# Patient Record
Sex: Male | Born: 1970 | Race: Black or African American | Hispanic: No | Marital: Single | Smoking: Current some day smoker
Health system: Southern US, Community
[De-identification: ages and names within clinical notes are randomized; demographics above are authoritative.]

## PROBLEM LIST (undated history)

## (undated) DIAGNOSIS — I10 Essential (primary) hypertension: Secondary | ICD-10-CM

## (undated) DIAGNOSIS — Q2730 Arteriovenous malformation, site unspecified: Secondary | ICD-10-CM

## (undated) DIAGNOSIS — N529 Male erectile dysfunction, unspecified: Secondary | ICD-10-CM

## (undated) DIAGNOSIS — F22 Delusional disorders: Secondary | ICD-10-CM

## (undated) DIAGNOSIS — Q282 Arteriovenous malformation of cerebral vessels: Secondary | ICD-10-CM

## (undated) DIAGNOSIS — T1491XA Suicide attempt, initial encounter: Secondary | ICD-10-CM

## (undated) DIAGNOSIS — F411 Generalized anxiety disorder: Secondary | ICD-10-CM

## (undated) DIAGNOSIS — Z9889 Other specified postprocedural states: Secondary | ICD-10-CM

## (undated) DIAGNOSIS — H9190 Unspecified hearing loss, unspecified ear: Secondary | ICD-10-CM

## (undated) HISTORY — PX: FRACTURE SURGERY: SHX138

---

## 2000-04-20 ENCOUNTER — Encounter: Payer: Self-pay | Admitting: Emergency Medicine

## 2000-04-20 ENCOUNTER — Emergency Department (HOSPITAL_COMMUNITY): Admission: EM | Admit: 2000-04-20 | Discharge: 2000-04-20 | Payer: Self-pay | Admitting: Emergency Medicine

## 2000-04-20 DIAGNOSIS — Q282 Arteriovenous malformation of cerebral vessels: Secondary | ICD-10-CM

## 2000-04-20 HISTORY — DX: Arteriovenous malformation of cerebral vessels: Q28.2

## 2000-04-28 ENCOUNTER — Encounter: Admission: RE | Admit: 2000-04-28 | Discharge: 2000-04-28 | Payer: Self-pay | Admitting: Internal Medicine

## 2000-05-05 ENCOUNTER — Encounter: Admission: RE | Admit: 2000-05-05 | Discharge: 2000-05-05 | Payer: Self-pay | Admitting: Hematology and Oncology

## 2000-06-06 ENCOUNTER — Ambulatory Visit (HOSPITAL_COMMUNITY): Admission: RE | Admit: 2000-06-06 | Discharge: 2000-06-06 | Payer: Self-pay

## 2000-06-06 ENCOUNTER — Emergency Department (HOSPITAL_COMMUNITY): Admission: EM | Admit: 2000-06-06 | Discharge: 2000-06-06 | Payer: Self-pay | Admitting: Emergency Medicine

## 2000-06-06 ENCOUNTER — Encounter: Payer: Self-pay | Admitting: Emergency Medicine

## 2006-11-01 ENCOUNTER — Emergency Department (HOSPITAL_COMMUNITY): Admission: EM | Admit: 2006-11-01 | Discharge: 2006-11-01 | Payer: Self-pay | Admitting: Family Medicine

## 2006-12-12 ENCOUNTER — Emergency Department (HOSPITAL_COMMUNITY): Admission: EM | Admit: 2006-12-12 | Discharge: 2006-12-12 | Payer: Self-pay | Admitting: Family Medicine

## 2010-01-25 HISTORY — PX: CARDIAC CATHETERIZATION: SHX172

## 2010-02-20 ENCOUNTER — Emergency Department (HOSPITAL_COMMUNITY): Admission: EM | Admit: 2010-02-20 | Discharge: 2010-02-20 | Payer: Self-pay | Admitting: Family Medicine

## 2010-02-20 ENCOUNTER — Inpatient Hospital Stay (HOSPITAL_COMMUNITY): Admission: EM | Admit: 2010-02-20 | Discharge: 2010-02-24 | Payer: Self-pay | Admitting: Emergency Medicine

## 2010-02-24 DIAGNOSIS — Z9889 Other specified postprocedural states: Secondary | ICD-10-CM

## 2010-02-24 HISTORY — DX: Other specified postprocedural states: Z98.890

## 2010-11-18 IMAGING — CT CT HEAD W/O CM
1 series · 16 of 30 positions shown, 20 images · non-contrast
Comparison: None.

CLINICAL DATA: Hypertensive.

CT HEAD WITHOUT CONTRAST
TECHNIQUE: Contiguous axial images were obtained from the base of
the skull through the vertex without contrast.

[Series 2: head routine 4.8 h37s · axial · 0.48mm/px · z∈[-139,+15]mm · 16 of 36 slices shown, 20 images]
[im 2/36  brain]
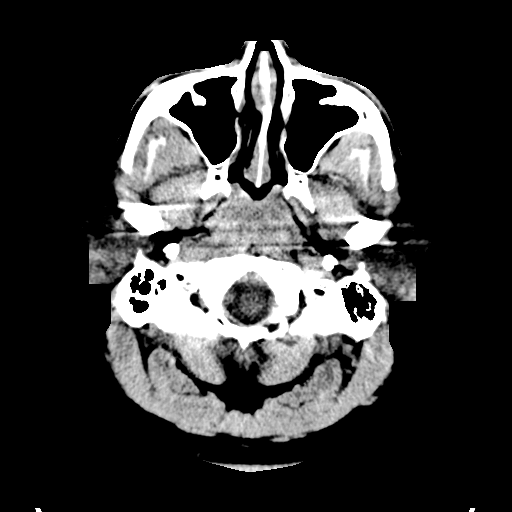
[im 2/36  bone]
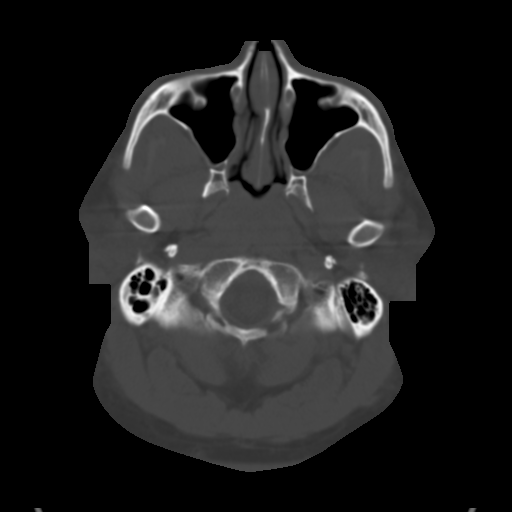
[im 4/36  brain]
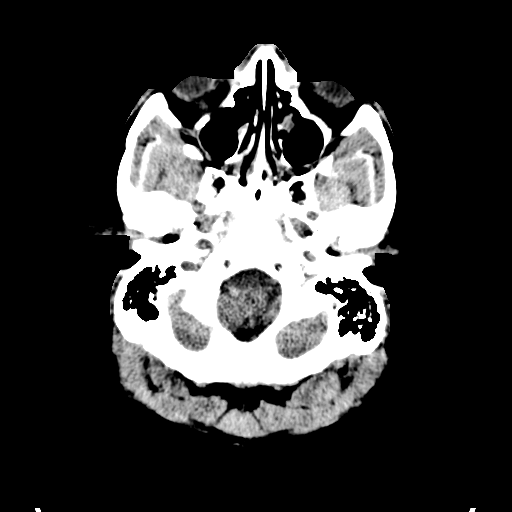
[im 7/36  brain]
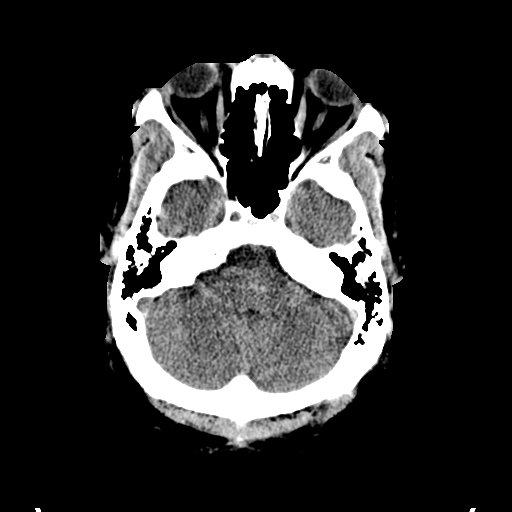
[im 9/36  brain]
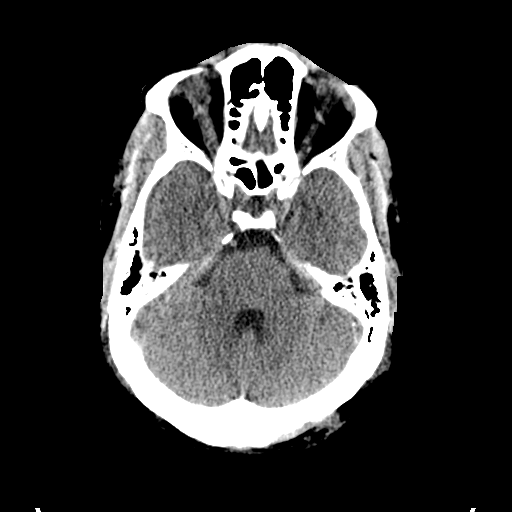
[im 10/36  brain]
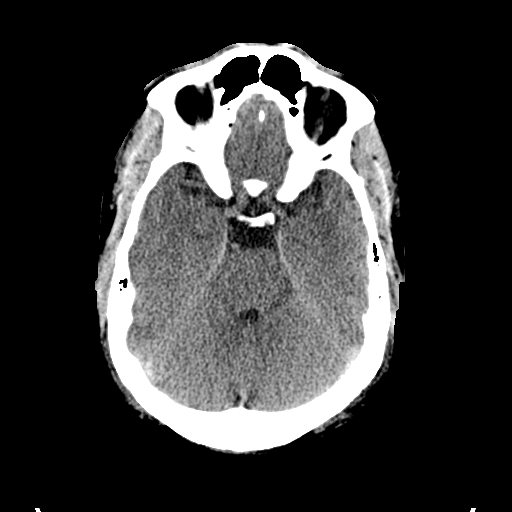
[im 10/36  bone]
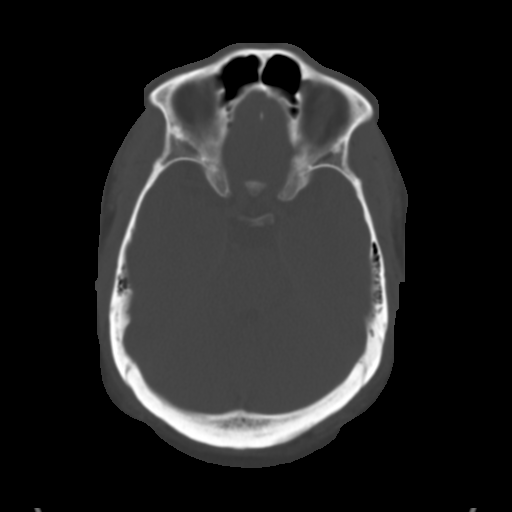
[im 13/36  brain]
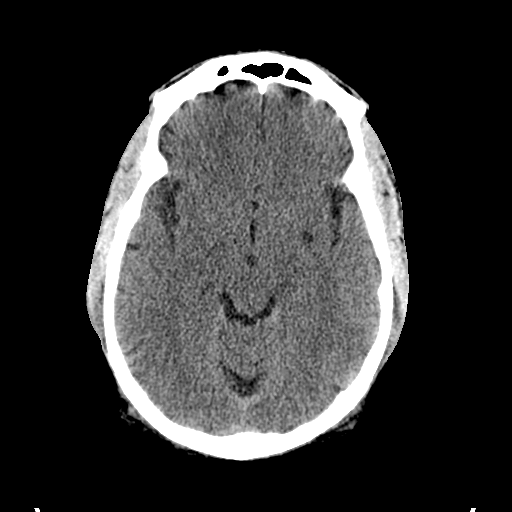
[im 15/36  brain]
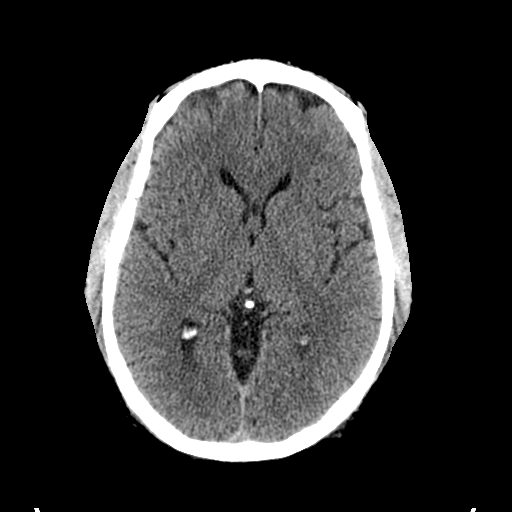
[im 17/36  brain]
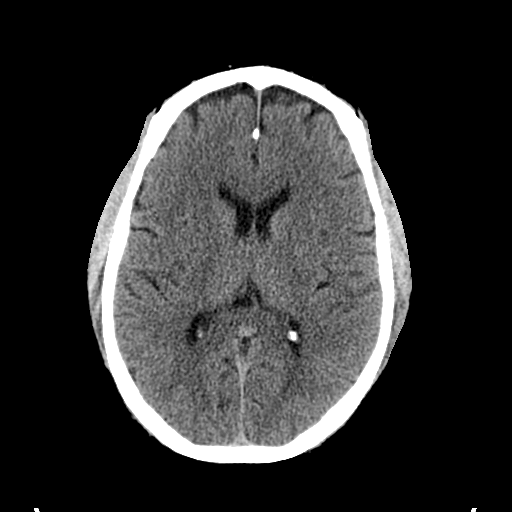
[im 19/36  brain]
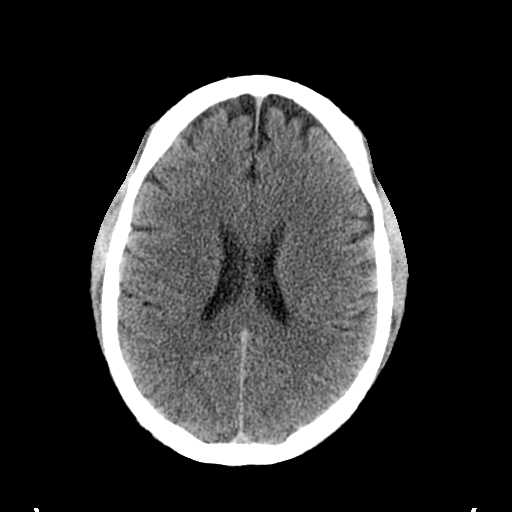
[im 19/36  bone]
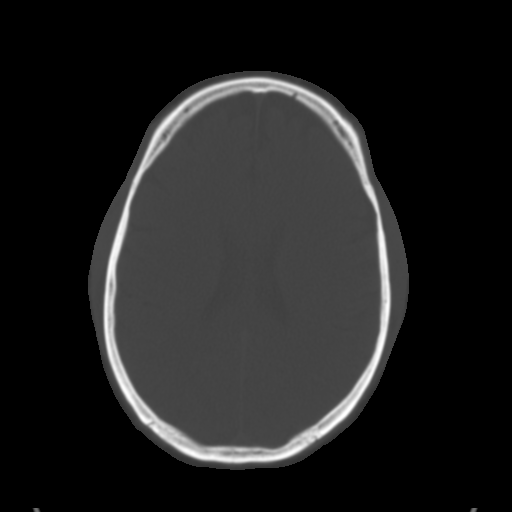
[im 21/36  brain]
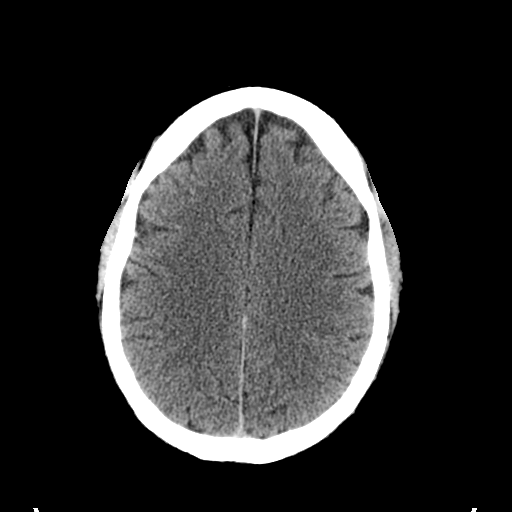
[im 23/36  brain]
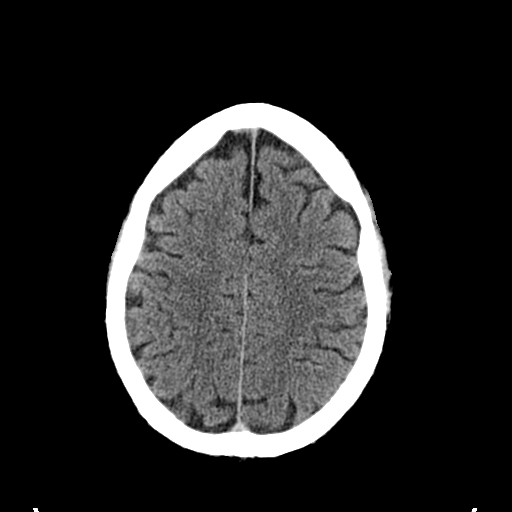
[im 26/36  brain]
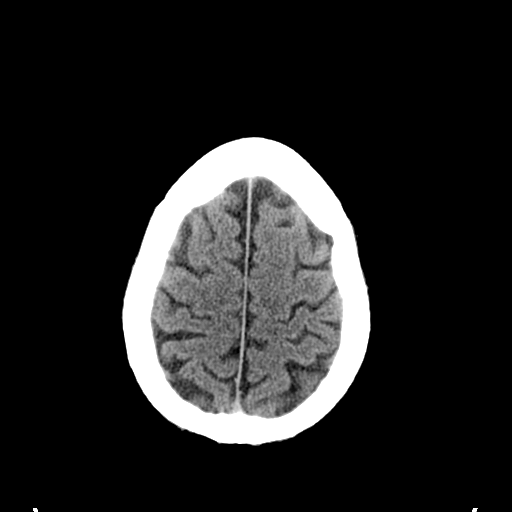
[im 27/36  brain]
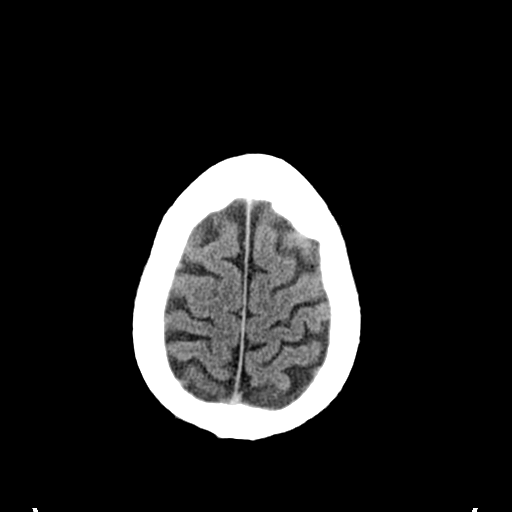
[im 27/36  bone]
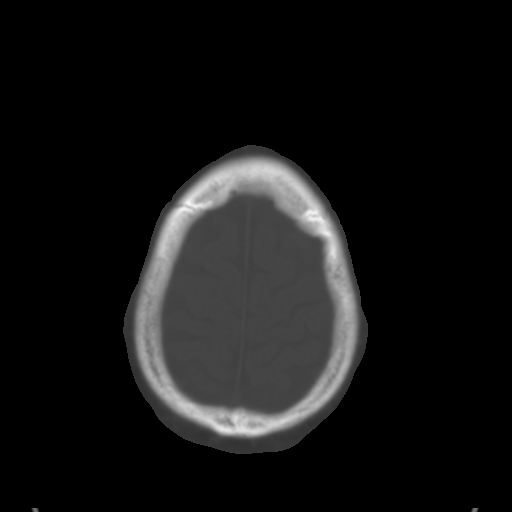
[im 29/36  brain]
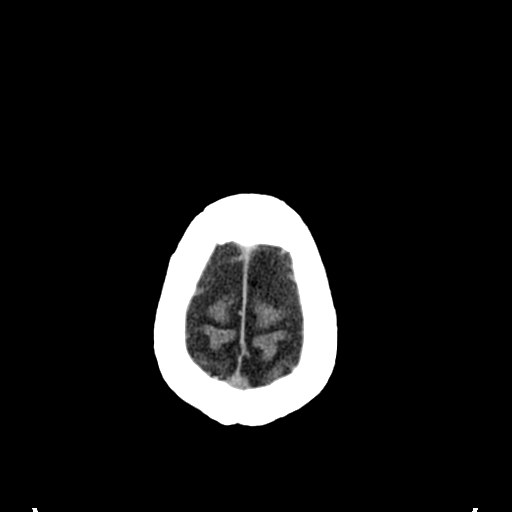
[im 32/36  brain]
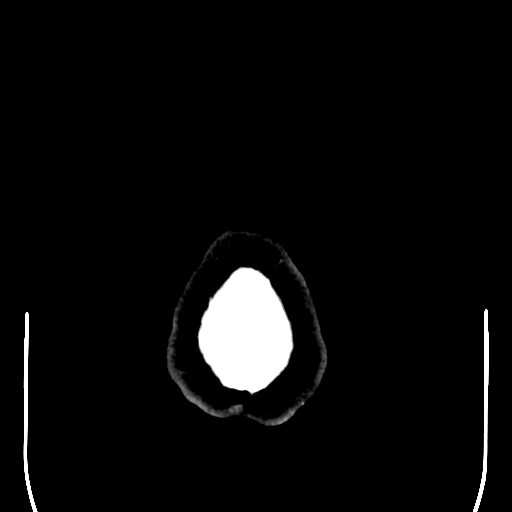
[im 34/36  brain]
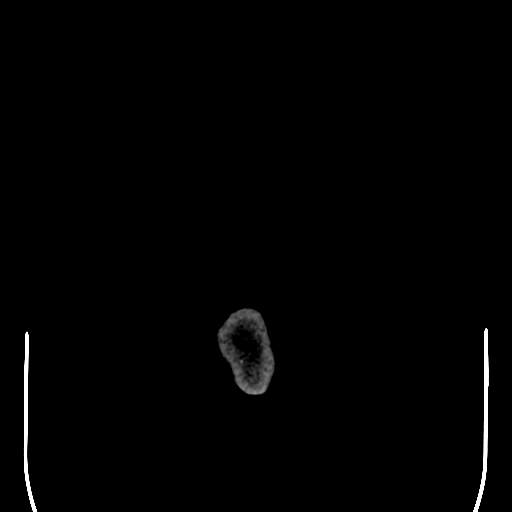

[16 of 30 positions shown; findings below may reference images not displayed]

FINDINGS: No acute intracranial abnormality.  No hemorrhage, acute
infarction, mass lesion, or hydrocephalus.  Aerated paranasal and
mastoid sinuses.
IMPRESSION: No acute intracranial findings.

## 2010-12-14 LAB — CBC
HCT: 42.2 % (ref 39.0–52.0)
Hemoglobin: 13 g/dL (ref 13.0–17.0)
Hemoglobin: 14.1 g/dL (ref 13.0–17.0)
MCHC: 33.5 g/dL (ref 30.0–36.0)
MCHC: 33.9 g/dL (ref 30.0–36.0)
MCHC: 34.3 g/dL (ref 30.0–36.0)
MCV: 83 fL (ref 78.0–100.0)
WBC: 4.5 10*3/uL (ref 4.0–10.5)

## 2010-12-14 LAB — COMPREHENSIVE METABOLIC PANEL
ALT: 28 U/L (ref 0–53)
AST: 26 U/L (ref 0–37)
BUN: 11 mg/dL (ref 6–23)
Creatinine, Ser: 1.14 mg/dL (ref 0.4–1.5)
GFR calc non Af Amer: >60 mL/min (ref >60)
Glucose, Bld: 109 mg/dL — ABNORMAL HIGH (ref 70–99)

## 2010-12-14 LAB — CK: Total CK: 515 U/L — ABNORMAL HIGH (ref 7–232)

## 2010-12-14 LAB — BASIC METABOLIC PANEL
BUN: 11 mg/dL (ref 6–23)
CO2: 29 mEq/L (ref 19–32)
CO2: 29 mEq/L (ref 19–32)
Calcium: 9 mg/dL (ref 8.4–10.5)
Chloride: 106 mEq/L (ref 96–112)
Creatinine, Ser: 1.2 mg/dL (ref 0.4–1.5)
GFR calc Af Amer: >60 mL/min (ref >60)
GFR calc non Af Amer: >60 mL/min (ref >60)
Glucose, Bld: 118 mg/dL — ABNORMAL HIGH (ref 70–99)
Potassium: 3.8 mEq/L (ref 3.5–5.1)
Sodium: 140 mEq/L (ref 135–145)

## 2010-12-14 LAB — DIFFERENTIAL
Basophils Absolute: 0 10*3/uL (ref 0.0–0.1)
Basophils Relative: 0 % (ref 0–1)
Eosinophils Absolute: 0.2 10*3/uL (ref 0.0–0.7)
Eosinophils Relative: 3 % (ref 0–5)
Lymphocytes Relative: 38 % (ref 12–46)

## 2010-12-14 LAB — POCT CARDIAC MARKERS: Myoglobin, poc: 98.9 ng/mL (ref 12–200)

## 2010-12-14 LAB — CARDIAC PANEL(CRET KIN+CKTOT+MB+TROPI)
CK, MB: 2.3 ng/mL (ref 0.3–4.0)
Relative Index: 0.5 (ref 0.0–2.5)
Total CK: 431 U/L — ABNORMAL HIGH (ref 7–232)
Troponin I: 0.01 ng/mL (ref 0.00–0.06)
Troponin I: <0.01 ng/mL (ref 0.00–0.06)

## 2010-12-14 LAB — BRAIN NATRIURETIC PEPTIDE: Pro B Natriuretic peptide (BNP): 30 pg/mL (ref 0.0–100.0)

## 2010-12-14 LAB — PROTIME-INR: Prothrombin Time: 13 seconds (ref 11.6–15.2)

## 2010-12-14 LAB — LIPID PANEL
Total CHOL/HDL Ratio: 5.5 RATIO
Triglycerides: 188 mg/dL — ABNORMAL HIGH (ref <150)

## 2010-12-14 LAB — URINE CULTURE: Culture: NO GROWTH

## 2010-12-14 LAB — CK TOTAL AND CKMB (NOT AT ARMC): Total CK: 589 U/L — ABNORMAL HIGH (ref 7–232)

## 2010-12-14 LAB — URINALYSIS, ROUTINE W REFLEX MICROSCOPIC
Bilirubin Urine: NEGATIVE
Nitrite: NEGATIVE
Specific Gravity, Urine: 1.011 (ref 1.005–1.030)
Urobilinogen, UA: 0.2 mg/dL (ref 0.0–1.0)

## 2010-12-14 LAB — TSH: TSH: 1.053 u[IU]/mL (ref 0.350–4.500)

## 2011-02-12 NOTE — Consult Note (Signed)
[place]  Patient:    Timothy Jackson, Timothy Jackson                       MRN: [id number] Proc. Date: 04/20/00 Adm. Date:  [id number] Attending:  Molpus, John L CC:         Dr. ____________                          Consultation Report  REASON FOR REQUEST:  Midbrain arteriovenous malformation.  HISTORY OF PRESENT ILLNESS:  The patient is a 40 year old, right-handed, black male who complains of hearing loss over a 8-month period involving the right ear.  He has had ear pain for two days and came to the ER to be evaluated.  he was found to be hypertensive, with a blood pressure of 178/94.  An MRI was performed because of the history of hearing loss and it was felt to be neurosensory on evaluation by the emergency room physician.  MRI showed a small AVM in the dorsal aspect of the fourth ventricle near the inferior caliculus.  There is no mass effect.  There is no evidence of bleeding. Neurosurgical consultation was requested.  PAST MEDICAL HISTORY:  The patients general health has been very good.  He has noted some skin lesions which have ruptured and flown pus, which seemed to be either infected sebaceous cysts or carbuncles.  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  He notes no other health problems.  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  ALLERGIES:  He notes no allergies to any medications.  REVIEW OF SYSTEMS:  Negative except for the items in the history of present illness.  PHYSICAL EXAMINATION:  GENERAL:  He is an alert, oriented, cooperative individual in no overt distress.  NEUROLOGIC:  He has pupils that are 4 mm, briskly reactive to light and accommodation.  The extraocular movements are full.  The face is symmetric to grimace.  Tongue and uvula are in the midline.  Sclerae and conjunctivae  are clear.  Fundi reveal the disks to be flat.  No spontaneous venous pulsations are seen.  Upper and lower extremity strength is normal.  Reflexes are normal. There is a paucity of hearing in the right ear.  There appears to be normal hearing in the left ear.  DIAGNOSTIC STUDIES:  Review of the MRI demonstrates the midbrain lesion at the dorsal aspect of the fourth ventricle near what would be the inferior caliculus.  It is most likely consistent with a small arteriovenous malformation.  IMPRESSION:  My impression at the current time is that the patient has hearing loss in the right ear.  He has an arteriovenous malformation of the brain stem.  At this point I am not certain that there is a relationship between these two lesions.  He also has a diagnosis of hypertension noted today.  He has not had any treatment for this and does not have a family physician that he sees regularly.  I have advised that we should perform formal audiometry on an outpatient basis.  The patient will require an angiogram to better define the nature of the arteriovenous malformation.  Also, he may need further followup for his hypertension although this is a new finding at the current time  and needs to be followed for some period of time.  I will see the patient as an outpatient for the follow-up studies. DD:  04/20/00 TD:  04/21/00 Job: [id number]

## 2011-10-07 ENCOUNTER — Emergency Department (HOSPITAL_COMMUNITY)
Admission: EM | Admit: 2011-10-07 | Discharge: 2011-10-07 | Disposition: A | Discharge status: Home / Self Care | Payer: Self-pay

## 2011-10-07 NOTE — ED Notes (Signed)
Nursing has attempted to triage pt 3 times with no response to calling. Pt's belongings are in the triage room but unable to locate pt. Someone stated that he went outside to use his phone.

## 2011-10-09 ENCOUNTER — Other Ambulatory Visit: Payer: Self-pay

## 2011-10-09 ENCOUNTER — Observation Stay (HOSPITAL_COMMUNITY)
Admission: EM | Admit: 2011-10-09 | Discharge: 2011-10-09 | Disposition: A | Discharge status: Home / Self Care | Payer: Self-pay | Attending: Emergency Medicine | Admitting: Emergency Medicine

## 2011-10-09 ENCOUNTER — Observation Stay (HOSPITAL_COMMUNITY): Payer: Self-pay

## 2011-10-09 ENCOUNTER — Encounter (HOSPITAL_COMMUNITY): Payer: Self-pay | Admitting: Emergency Medicine

## 2011-10-09 DIAGNOSIS — R51 Headache: Secondary | ICD-10-CM | POA: Insufficient documentation

## 2011-10-09 DIAGNOSIS — R2240 Localized swelling, mass and lump, unspecified lower limb: Secondary | ICD-10-CM

## 2011-10-09 DIAGNOSIS — Z9119 Patient's noncompliance with other medical treatment and regimen: Secondary | ICD-10-CM | POA: Insufficient documentation

## 2011-10-09 DIAGNOSIS — I5030 Unspecified diastolic (congestive) heart failure: Secondary | ICD-10-CM | POA: Insufficient documentation

## 2011-10-09 DIAGNOSIS — R42 Dizziness and giddiness: Secondary | ICD-10-CM | POA: Insufficient documentation

## 2011-10-09 DIAGNOSIS — Z91199 Patient's noncompliance with other medical treatment and regimen due to unspecified reason: Secondary | ICD-10-CM | POA: Insufficient documentation

## 2011-10-09 DIAGNOSIS — F17200 Nicotine dependence, unspecified, uncomplicated: Secondary | ICD-10-CM | POA: Insufficient documentation

## 2011-10-09 DIAGNOSIS — M79609 Pain in unspecified limb: Secondary | ICD-10-CM

## 2011-10-09 DIAGNOSIS — I10 Essential (primary) hypertension: Principal | ICD-10-CM | POA: Insufficient documentation

## 2011-10-09 HISTORY — DX: Essential (primary) hypertension: I10

## 2011-10-09 HISTORY — DX: Other specified postprocedural states: Z98.890

## 2011-10-09 HISTORY — DX: Male erectile dysfunction, unspecified: N52.9

## 2011-10-09 HISTORY — DX: Unspecified hearing loss, unspecified ear: H91.90

## 2011-10-09 LAB — URINALYSIS, ROUTINE W REFLEX MICROSCOPIC
Glucose, UA: NEGATIVE mg/dL
Hgb urine dipstick: NEGATIVE
Ketones, ur: NEGATIVE mg/dL
Protein, ur: NEGATIVE mg/dL

## 2011-10-09 LAB — BASIC METABOLIC PANEL
CO2: 26 mEq/L (ref 19–32)
Calcium: 9.3 mg/dL (ref 8.4–10.5)
Creatinine, Ser: 0.95 mg/dL (ref 0.50–1.35)
Glucose, Bld: 105 mg/dL — ABNORMAL HIGH (ref 70–99)

## 2011-10-09 LAB — CBC
Hemoglobin: 12.6 g/dL — ABNORMAL LOW (ref 13.0–17.0)
MCH: 26.9 pg (ref 26.0–34.0)
MCV: 81.4 fL (ref 78.0–100.0)
RBC: 4.68 MIL/uL (ref 4.22–5.81)

## 2011-10-09 LAB — POCT I-STAT TROPONIN I: Troponin i, poc: 0.02 ng/mL (ref 0.00–0.08)

## 2011-10-09 LAB — URINE MICROSCOPIC-ADD ON

## 2011-10-09 MED ORDER — CLONIDINE HCL 0.1 MG PO TABS
0.1000 mg | ORAL_TABLET | Freq: Once | ORAL | Status: AC
  Administered 2011-10-09: 0.1 mg via ORAL
  Filled 2011-10-09: qty 1

## 2011-10-09 NOTE — ED Notes (Signed)
Ace Wrap applied to left lower leg per orders. Pt is now waiting for [facility] dr to come and talk with pt about his bp medications and who to follow up with later.

## 2011-10-09 NOTE — ED Notes (Signed)
Pt sts came here two days ago to be seen for left calf pain and swelling but left before being seen; pt sts HA and dizziness this morning upon waking; pt sts told 2 days ago his BP was high; pt sts he used to take BP meds but stopped 9 months ago due to side effects; pt sts having pain and swelling in left calf

## 2011-10-09 NOTE — ED Provider Notes (Addendum)
History     CSN: [id number]  Arrival date & time 10/09/11  1056   First MD Initiated Contact with Patient 10/09/11 1108      Chief Complaint  Patient presents with  . Hypertension  . Leg Pain  . Headache   41 year old male with a known history of uncontrolled hypertension, diastolic heart failure, morbid obesity, medication noncompliance. Apparently, last admitted in May of 2011 for hypertensive emergency or urgency.  Has been noncompliant with all his medications including amlodipine, aspirin, hydrochlorothiazide, lisinopril, and metoprolol. Patient, states she's been out of his meds for at least 9 months. He did compare 2 days ago for left calf pain, which began after playing ping-pong. He's also had headache and dizziness. He denies any chest pain or shortness of breath. Denies any focal, motor weakness. He denies any slurred speech or blurred vision. Blood pressure is noted to be in the range of 187/113.  (Consider location/radiation/quality/duration/timing/severity/associated sxs/prior treatment) HPI  Past Medical History  Diagnosis Date  . Hypertension     History reviewed. No pertinent past surgical history.  History reviewed. No pertinent family history.  History  Substance Use Topics  . Smoking status: Current Everyday Smoker  . Smokeless tobacco: Not on file  . Alcohol Use: Yes      Review of Systems  Allergies  Review of patient's allergies indicates no known allergies.  Home Medications   Current Outpatient Rx  Name Route Sig Dispense Refill  . ASPIRIN 81 MG PO CHEW Oral Chew 162 mg by mouth daily.      BP 197/124  Pulse 79  Temp(Src) 98.5 F (36.9 C) (Oral)  Resp 20  SpO2 95%  Physical Exam  ED Course  Procedures (including critical care time)   Labs Reviewed  CBC  BASIC METABOLIC PANEL  I-STAT TROPONIN I   No results found.   No diagnosis found.    MDM  Pt is seen and examined;  Initial history and physical completed.  Will  follow.    Will need complete workup for hypertensive urgency, including basic labs, EKG, chest x-ray, CAT scan of the head. Will need a urinalysis. Given oral clonidine for blood pressure. Also, checking duplex ultrasound of the left lower extremity. May need admission. Will transition to the CDU for further care and observation         Noemi Bellissimo A. Patrica Duel, MD 10/09/11 1118   Date: 10/09/2011  Rate: 73  Rhythm: normal sinus rhythm  QRS Axis: normal  Intervals: PR prolonged  ST/T Wave abnormalities: nonspecific ST/T changes  Conduction Disutrbances:left anterior fascicular block  Narrative Interpretation:   Old EKG Reviewed: changes noted    Leilani Cespedes A. Patrica Duel, MD 10/09/11 1319

## 2011-10-09 NOTE — ED Provider Notes (Signed)
Patient report received from my attending. 41 year old gentleman with history of uncontrolled hypertension who has stopped taking his blood pressure medication for at least nine-month.  He presents to the ED complaining of hypertension, headache, and leg pain. He has been seen and evaluated by my attending. He was instructed to move to CDU to continue his workup. Plan is to have patient admitted for further management of his hypertension.  2:11 PM Patient states his left leg pain begins about 3 weeks ago when he noticed a pop to his left calf while playing ping-pong.  He has noticed increased swelling throughout lower extremities. Pain worsened with ambulation, and with palpation. We will obtain a lower extremity Doppler to rule out possible DVT. Clonidine given for his hypertension. Currently patient denies significant headache, or vision changes. His head CT, chest x-ray, troponin, CBC, CMP all within normal limits. On exam, his lungs clear to auscultation bilaterally, heart with regular rate rhythm without murmurs, abdomen is soft and benign, left lower leg with 2+ pitting edema, pedal pulse palpable. Calf tender on palpation. No tenderness to Achilles tendon. Right lower leg of normal size.  2:44 PM Patient's blood pressure continues to be elevated despite receiving clonidine. With poor followup, and uncontrolled blood pressure, it is felt necessary to consult with admitting team for admission and further management.  Venous Doppler ultrasound of left lower extremities reveal no evidence of DVT. However there is a hypoechoic mass to mid calf which may be consistent with a hematoma or muscle tear. If patient is to be admitted, an inpatient also consult is appropriate.  3:04 PM 5 consult with outpatient clinic, Dr. Rosana Berger, who agrees to see patient in the ED for evaluation and will likely discharge patient with followup in outpatient clinic. I will give patient a referral to orthopedic for further  management of his left lower extremity swelling as this may be a muscle tear. Patient was understanding and agree with plan.  Fayrene Helper, PA-C 10/10/11 [phone]

## 2011-10-09 NOTE — H&P (Signed)
Hospital Admission Note Date: 10/09/2011  Patient name: Timothy Jackson Medical record number: [id number] Date of birth: 1971-07-03 Age: 41 y.o. Gender: male PCP: No primary provider on file.  Medical Service: Josefine Class - Consulted in ED  Attending physician: Dr. Rogelia Boga    1st Contact: Dr. Milbert Coulter   Pager: [phone] 2nd Contact: Dr. Anselm Jungling    Pager: [phone]  Chief Complaint: Dizziness, HA that has resolved  History of Present Illness: Patient is a pleasant 42 year old man with PMH of HTN who presents after recently having an episode of dizziness and HA that have since resolved.  He actually came to the ED yesterday, but did not want to wait 4 hours to be seen.  He reports that last year, he was admitted for high BP and was given 4 antihypertensive medications, which he took for about 4 months and then discontinued.  He has been without medication for the last 9 months.  Stressors noted in the last 4 months include a new job.  3.5-4 weeks ago, he strained his left calf muscle, and has been in pain that has worsened over the last week.  He thinks pain may be contributing to BP.  He reports one episode of dizziness while in the elevator last week.  He describes HA as diffuse, 7-8/10 this morning, now resolved, non-throbbing.  He denies CP, blurry vision, numbness/tingling, nose bleeds.  HA is better with tylenol, rest and a cold drink.  He notes that some reasons he stopped taking antihypertensive medications were constipation and erectile dysfunction.  At this point, it is more impt to him to take care of his HTN, and maybe consider viagra if ED persists.    Chart review suggests that he was discharged from the hospital on February 25, 2010 with:   1. Alprazolam 0.5 mg by mouth every 8 hours as needed.   2. Amlodipine 10 mg p.o. daily.   3. Aspirin 81 mg p.o. daily.   4. Hydrochlorothiazide 25 mg p.o. daily.   5. Lisinopril 20 mg p.o. twice a day.   6. Metoprolol 25 mg p.o. B.i.d.  Meds: Medications  Prior to Admission  Medication Sig Dispense Refill  . aspirin 81 MG chewable tablet Chew 162 mg by mouth 3 (three) times a week.        Medications Prior to Admission  Medication Dose Route Frequency Provider Last Rate Last Dose  . cloNIDine (CATAPRES) tablet 0.1 mg  0.1 mg Oral Once Peter A. Patrica Duel, MD   0.1 mg at 10/09/11 1126    Allergies: Review of patient's allergies indicates no known allergies.  Past Medical History  Diagnosis Date  . Hypertension   . AVM (arteriovenous malformation) spine   . Erectile dysfunction     Thought to be due to BP meds in the past, but as of 10/09/11, he reports that BP is more important to him  . Deaf     Right ear   History reviewed. No pertinent past surgical history.  Family History  Problem Relation Age of Onset  . Hypertension Father   . Coronary artery disease Maternal Grandmother   . Hypertension Maternal Grandmother   . Diabetes Maternal Grandmother   . Coronary artery disease Maternal Uncle   . Hypertension Maternal Uncle   . Diabetes Maternal Aunt   . Leukemia Maternal Uncle    History   Social History  . Marital Status: Married    Spouse Name: N/A    Number of Children: N/A  .  Years of Education: N/A   Occupational History  . desk job     Scientist, water quality   Social History Main Topics  . Smoking status: Current Some Day Smoker  . Smokeless tobacco: Not on file  . Alcohol Use: 0.0 oz/week     Beer/wine  . Drug Use: Yes     Occassional marijuana use  . Sexually Active: Not Currently   Other Topics Concern  . Not on file   Social History Narrative  . No narrative on file   Review of Systems: General: no fevers, chills, changes in weight, changes in appetite Skin: no rash HEENT: no blurry vision, hearing changes, sore throat Pulm: no dyspnea, coughing, wheezing CV: no chest pain, palpitations, shortness of breath Abd: no abdominal pain, nausea/vomiting, diarrhea/constipation GU: no dysuria, hematuria,  polyuria Ext: no arthralgias, myalgias Neuro: no weakness, numbness, or tingling  Physical Exam: Blood pressure 171/135, pulse 66, temperature 98 F (36.7 C), temperature source Oral, resp. rate 22, SpO2 99.00%. General: resting in bed, NAD, cooperative to exam HEENT: PERRL, EOMI, no scleral icterus, no conjunctival pallor Cardiac: RRR, no rubs, murmurs or gallops Pulm: clear to auscultation bilaterally, moving normal volumes of air Abd: soft, nontender, nondistended, obese, BS normoactive Ext: warm and well perfused, no pedal edema, left calf muscle tender to palpation, but appears symmetric to right calf Neuro: alert and oriented X3, cranial nerves II-XII grossly intact  Lab results: Basic Metabolic Panel:  Basename 10/09/11 1113  NA 138  K 4.0  CL 103  CO2 26  GLUCOSE 105*  BUN 17  CREATININE 0.95  CALCIUM 9.3  MG --  PHOS --   CBC:  Basename 10/09/11 1113  WBC 5.7  NEUTROABS --  HGB 12.6*  HCT 38.1*  MCV 81.4  PLT 298   i-Stat Troponin - 0.02  Results for Timothy Jackson, Timothy Jackson (MRN [id number]) as of 10/09/2011 16:51  Ref. Range 10/09/2011 13:48  Color, Urine Latest Range: YELLOW  YELLOW  APPearance Latest Range: CLEAR  CLEAR  Specific Gravity, Urine Latest Range: 1.005-1.030  1.018  pH Latest Range: 5.0-8.0  6.0  Glucose, UA Latest Range: NEGATIVE mg/dL NEGATIVE  Bilirubin Urine Latest Range: NEGATIVE  NEGATIVE  Ketones, ur Latest Range: NEGATIVE mg/dL NEGATIVE  Protein Latest Range: NEGATIVE mg/dL NEGATIVE  Urobilinogen, UA Latest Range: 0.0-1.0 mg/dL 0.2  Nitrite Latest Range: NEGATIVE  NEGATIVE  Leukocytes, UA Latest Range: NEGATIVE  TRACE (A)  WBC, UA Latest Range: <3 WBC/hpf 0-2  Squamous Epithelial / LPF Latest Range: RARE  RARE   Results for Timothy Jackson, Timothy Jackson (MRN [id number]) as of 10/09/2011 16:51  Ref. Range 10/09/2011 13:48  Hgb urine dipstick Latest Range: NEGATIVE  NEGATIVE   Imaging results:  Dg Chest 2 View  10/09/2011  *RADIOLOGY REPORT*  Clinical  Data: Injury, pain.  CHEST - 2 VIEW  Comparison: PA and lateral chest 02/20/2010.  Findings: Minimal atelectasis or scarring is seen in the left lung base.  Lungs otherwise clear.  No pneumothorax or effusion.  Heart size normal.  No focal bony abnormality.  IMPRESSION: No acute disease.  Original Report Authenticated By: Bernadene Bell. Maricela Curet, M.D.   Ct Head Wo Contrast  10/09/2011  *RADIOLOGY REPORT*  Clinical Data: High blood pressure.  Headache, dizziness. Off hypertension medication.  CT HEAD WITHOUT CONTRAST  Technique:  Contiguous axial images were obtained from the base of the skull through the vertex without contrast.  Comparison: 02/20/2010  Findings: There is no intra or extra-axial fluid collection or mass  lesion.  The basilar cisterns and ventricles have a normal appearance.  There is no CT evidence for acute infarction or hemorrhage.  Bone windows show no acute findings  IMPRESSION: Negative exam.  Original Report Authenticated By: Patterson Hammersmith, M.D.   LLE Venous Doppler : No deep or superficial vein thrombosis noted; large area noted mid calf with no blood flow noted within, etiology unknown  Other results: EKG: 73 bpm, NSR, normal QRS, nonspecific ST/T changes  Assessment & Plan by Problem:  #Hypertension: Patient has a chronic problem of HTN.  In setting of no symptoms currently, the plan is to discharge him with HCTZ-Lisinopril 12.5-10 daily for gradual reduction of BP to avoid stroke.  He will not have difficulty getting medications.  He will have follow up with the [facility] next week to monitor BP control, and he will be established as a patient there.  He is agreeable to modifying his diet and smoking cessation.  At clinic, medications can be titrated up for optimal control.  #Left Leg Pain: Likely 2/2 hematoma vs muscle strain, DVT ruled out by doppler, pain improved with ACE bandage wrap.  We will prescribe flexiril for symptom mgmt.   SignedVernice Jefferson 10/09/2011, 4:23  PM   Rosana Berger 10/09/2011, 4:23 PM

## 2011-10-09 NOTE — Progress Notes (Signed)
*  PRELIMINARY RESULTS* Left lower extremity venous Doppler completed.  There is no deep or superficial vein thrombosis noted in the left lower extremity.  There is a large hypoechoic area noted mid calf with no blood flow noted within, etiology unknown.     Sherren Kerns Renee 10/09/2011, 2:32 PM

## 2011-10-09 NOTE — ED Provider Notes (Signed)
Medical screening examination/treatment/procedure(s) were conducted as a shared visit with non-physician practitioner(s) and myself.  I personally evaluated the patient during the encounter   Chang Tiggs A. Patrica Duel, MD 10/09/11 1610

## 2011-10-09 NOTE — ED Notes (Signed)
Talked with Fayrene Helper, PA about pt.s bp being elevated to 171/137. Not going to give pt any additional anithypertensives at this time but pt will probably be admitted for bp control.

## 2011-10-09 NOTE — ED Provider Notes (Signed)
I was asked upon change of shift to discharge patient when [facility] was finished seeing him.  I spoke with [facility] doctor who gave instructions for dispo as well as prescriptions.  I was not involved in the care of the patient.    Dillard Cannon [place], [place] 10/09/11 1615

## 2011-10-11 NOTE — ED Provider Notes (Signed)
Medical screening examination/treatment/procedure(s) were conducted as a shared visit with non-physician practitioner(s) and myself.  I personally evaluated the patient during the encounter   Sheetal Lyall A. Sarinity Dicicco, MD 10/11/11 2326 

## 2013-05-15 ENCOUNTER — Emergency Department (HOSPITAL_COMMUNITY)
Admission: EM | Admit: 2013-05-15 | Discharge: 2013-05-16 | Disposition: A | Discharge status: Other Acute Inpatient Hospital | Payer: Self-pay | Attending: Emergency Medicine | Admitting: Emergency Medicine

## 2013-05-15 ENCOUNTER — Encounter (HOSPITAL_COMMUNITY): Payer: Self-pay | Admitting: *Deleted

## 2013-05-15 ENCOUNTER — Emergency Department (HOSPITAL_COMMUNITY): Payer: Self-pay

## 2013-05-15 DIAGNOSIS — F17200 Nicotine dependence, unspecified, uncomplicated: Secondary | ICD-10-CM | POA: Insufficient documentation

## 2013-05-15 DIAGNOSIS — Z9889 Other specified postprocedural states: Secondary | ICD-10-CM | POA: Insufficient documentation

## 2013-05-15 DIAGNOSIS — I10 Essential (primary) hypertension: Secondary | ICD-10-CM | POA: Insufficient documentation

## 2013-05-15 DIAGNOSIS — F411 Generalized anxiety disorder: Secondary | ICD-10-CM | POA: Insufficient documentation

## 2013-05-15 DIAGNOSIS — R45851 Suicidal ideations: Secondary | ICD-10-CM | POA: Insufficient documentation

## 2013-05-15 DIAGNOSIS — Z8669 Personal history of other diseases of the nervous system and sense organs: Secondary | ICD-10-CM | POA: Insufficient documentation

## 2013-05-15 DIAGNOSIS — Z7982 Long term (current) use of aspirin: Secondary | ICD-10-CM | POA: Insufficient documentation

## 2013-05-15 DIAGNOSIS — M7989 Other specified soft tissue disorders: Secondary | ICD-10-CM | POA: Insufficient documentation

## 2013-05-15 DIAGNOSIS — M542 Cervicalgia: Secondary | ICD-10-CM | POA: Insufficient documentation

## 2013-05-15 DIAGNOSIS — Z87448 Personal history of other diseases of urinary system: Secondary | ICD-10-CM | POA: Insufficient documentation

## 2013-05-15 DIAGNOSIS — R0789 Other chest pain: Secondary | ICD-10-CM | POA: Insufficient documentation

## 2013-05-15 DIAGNOSIS — F22 Delusional disorders: Secondary | ICD-10-CM | POA: Insufficient documentation

## 2013-05-15 DIAGNOSIS — F419 Anxiety disorder, unspecified: Secondary | ICD-10-CM

## 2013-05-15 DIAGNOSIS — R51 Headache: Secondary | ICD-10-CM | POA: Insufficient documentation

## 2013-05-15 DIAGNOSIS — F1210 Cannabis abuse, uncomplicated: Secondary | ICD-10-CM | POA: Insufficient documentation

## 2013-05-15 LAB — COMPREHENSIVE METABOLIC PANEL
ALT: 27 U/L (ref 0–53)
AST: 27 U/L (ref 0–37)
Albumin: 4.2 g/dL (ref 3.5–5.2)
Alkaline Phosphatase: 54 U/L (ref 39–117)
GFR calc Af Amer: 86 mL/min — ABNORMAL LOW (ref >90)
Glucose, Bld: 106 mg/dL — ABNORMAL HIGH (ref 70–99)
Potassium: 3.6 mEq/L (ref 3.5–5.1)
Sodium: 141 mEq/L (ref 135–145)
Total Protein: 7.2 g/dL (ref 6.0–8.3)

## 2013-05-15 LAB — RAPID URINE DRUG SCREEN, HOSP PERFORMED
Amphetamines: NOT DETECTED
Barbiturates: NOT DETECTED
Benzodiazepines: NOT DETECTED
Cocaine: NOT DETECTED
Tetrahydrocannabinol: POSITIVE — AB

## 2013-05-15 LAB — CBC
Hemoglobin: 13.8 g/dL (ref 13.0–17.0)
MCHC: 34.4 g/dL (ref 30.0–36.0)

## 2013-05-15 LAB — ACETAMINOPHEN LEVEL: Acetaminophen (Tylenol), Serum: <15 ug/mL (ref 10–30)

## 2013-05-15 MED ORDER — IBUPROFEN 200 MG PO TABS: 600.0000 mg | ORAL_TABLET | Freq: Three times a day (TID) | ORAL | Status: DC | PRN

## 2013-05-15 MED ORDER — LISINOPRIL 20 MG PO TABS
20.0000 mg | ORAL_TABLET | Freq: Once | ORAL | Status: AC
  Administered 2013-05-15: 20 mg via ORAL
  Filled 2013-05-15: qty 1

## 2013-05-15 MED ORDER — ACETAMINOPHEN 325 MG PO TABS
650.0000 mg | ORAL_TABLET | ORAL | Status: DC | PRN
  Administered 2013-05-16: 650 mg via ORAL
  Filled 2013-05-15: qty 2

## 2013-05-15 MED ORDER — ZOLPIDEM TARTRATE 5 MG PO TABS: 5.0000 mg | ORAL_TABLET | Freq: Every evening | ORAL | Status: DC | PRN

## 2013-05-15 MED ORDER — AMLODIPINE BESYLATE 10 MG PO TABS
10.0000 mg | ORAL_TABLET | Freq: Every day | ORAL | Status: DC
  Administered 2013-05-15 – 2013-05-16 (×2): 10 mg via ORAL
  Filled 2013-05-15 (×2): qty 1

## 2013-05-15 MED ORDER — HYDROCHLOROTHIAZIDE 25 MG PO TABS
25.0000 mg | ORAL_TABLET | Freq: Every day | ORAL | Status: DC
  Administered 2013-05-15: 25 mg via ORAL
  Filled 2013-05-15 (×2): qty 1

## 2013-05-15 MED ORDER — METOPROLOL TARTRATE 25 MG PO TABS
25.0000 mg | ORAL_TABLET | Freq: Once | ORAL | Status: AC
  Administered 2013-05-15: 25 mg via ORAL
  Filled 2013-05-15: qty 1

## 2013-05-15 MED ORDER — HYDROCHLOROTHIAZIDE 25 MG PO TABS
25.0000 mg | ORAL_TABLET | Freq: Every day | ORAL | Status: DC
  Administered 2013-05-15 – 2013-05-16 (×2): 25 mg via ORAL
  Filled 2013-05-15: qty 1

## 2013-05-15 MED ORDER — LISINOPRIL 10 MG PO TABS
10.0000 mg | ORAL_TABLET | Freq: Every day | ORAL | Status: DC
  Administered 2013-05-15 – 2013-05-16 (×2): 10 mg via ORAL
  Filled 2013-05-15 (×2): qty 1

## 2013-05-15 MED ORDER — ONDANSETRON HCL 4 MG PO TABS: 4.0000 mg | ORAL_TABLET | Freq: Three times a day (TID) | ORAL | Status: DC | PRN

## 2013-05-15 MED ORDER — METOPROLOL TARTRATE 25 MG PO TABS
25.0000 mg | ORAL_TABLET | Freq: Two times a day (BID) | ORAL | Status: DC
  Administered 2013-05-15 – 2013-05-16 (×3): 25 mg via ORAL
  Filled 2013-05-15 (×3): qty 1

## 2013-05-15 MED ORDER — AMLODIPINE BESYLATE 10 MG PO TABS
10.0000 mg | ORAL_TABLET | Freq: Once | ORAL | Status: DC
  Filled 2013-05-15: qty 1

## 2013-05-15 MED ORDER — LORAZEPAM 1 MG PO TABS
1.0000 mg | ORAL_TABLET | Freq: Three times a day (TID) | ORAL | Status: DC | PRN
  Administered 2013-05-16: 1 mg via ORAL
  Filled 2013-05-15: qty 1

## 2013-05-15 MED ORDER — NICOTINE 21 MG/24HR TD PT24
21.0000 mg | MEDICATED_PATCH | Freq: Every day | TRANSDERMAL | Status: DC
  Filled 2013-05-15 (×2): qty 1

## 2013-05-15 NOTE — ED Provider Notes (Signed)
CSN: [id number]     Arrival date & time 05/15/13  1454 History     First MD Initiated Contact with Patient 05/15/13 1708     Chief Complaint  Patient presents with  . Hypertension    HPI  Timothy Jackson is a 42 year old male with a PMH of hypertension and AVM of the spine who presents to the ED for hypertension.  Patient also has multiple complaints including headache, chest tightness, and suicidal ideation.    Patient states that he has not been taking any blood pressure medications since his last admission here (10/09/2011).  He states that he received a three-month prescription for his amlodipine, hydrochlorothiazide, lisinopril and metoprolol which he was taking until he ran out. Patient has leg edema which he states is his baseline.  He also complains of diffuse chest tightness over the past 3 or 4 months. He denies any chest pain, SOB, dyspnea, or cough.  He states that his chest tightness is worse when he gets anxious. He states that when he rests "and feels calm" his chest tightness feels better.  He denies any chest tightness currently.  He denies any associated symptoms including diaphoresis, nausea, or palpitations.  He states he is still a tobacco smoker and occasionally uses marijuana. He denies any cocaine use. He states that he has no history of any heart problems in the past any had a negative cardiac catheterization in 2011.  He does not have a cardiologist currently.    He states that he has an all-over pounding headache for the past month.  He denies any visual changes, numbness, tingling, loss of sensation, or weakness.  He complains of all her neck pain but no neck stiffness.  He states that he has a history of AVM and is worried of something "serious going on in his head".    Patient also states that he needs to talk to somebody about "some issues have been dealing with". He admits to having suicidal thoughts with no plan. He is not homicidal.  He states that he's been very  stressed and has had family issues.  Patient states that "people are spreading rumors about me" and "people are trying to ruin my life".  He denies any visual or auditory hallucinations.  He states "people in the waiting room are after me" and "the nurse knows too much about my family."  He denies any history of any psychiatric conditions in the past.  Hd otherwise has been well with no recent fever, chills, change in activity or appetite, rhinorrhea, congestion, sore throat, abdominal pain, nausea, vomiting, diarrhea, constipation, or dysuria.  Patient currently moved back from [place] yesterday. He states he was living with his uncle.  He was living in a shelter when he lived in [place].      Past Medical History  Diagnosis Date  . Hypertension   . AVM (arteriovenous malformation) spine     MRI on 04/20/2000  . Erectile dysfunction     Thought to be due to BP meds in the past, but as of 10/09/11, he reports that BP is more important to him  . Deaf     Right ear  . History of cardiac cath 02/24/2010     LVH, EF of 50-55%, LAD has 10-15% proximal stenosis, by Dr. Sharyn Lull   History reviewed. No pertinent past surgical history. Family History  Problem Relation Age of Onset  . Hypertension Father   . Coronary artery disease Maternal Grandmother   .  Hypertension Maternal Grandmother   . Diabetes Maternal Grandmother   . Coronary artery disease Maternal Uncle   . Hypertension Maternal Uncle   . Diabetes Maternal Aunt   . Leukemia Maternal Uncle    History  Substance Use Topics  . Smoking status: Current Some Day Smoker  . Smokeless tobacco: Not on file  . Alcohol Use: 0.0 oz/week     Comment: Beer/wine    Review of Systems  Constitutional: Negative for fever, chills, activity change, appetite change and fatigue.  HENT: Positive for neck pain. Negative for ear pain, congestion, sore throat, rhinorrhea and neck stiffness.   Eyes: Negative for visual disturbance.  Respiratory:  Positive for chest tightness. Negative for cough, shortness of breath and wheezing.   Cardiovascular: Positive for leg swelling. Negative for chest pain and palpitations.  Gastrointestinal: Negative for nausea, vomiting, abdominal pain, diarrhea and constipation.  Genitourinary: Negative for dysuria and hematuria.  Musculoskeletal: Negative for back pain.  Skin: Negative for rash and wound.  Neurological: Positive for headaches. Negative for dizziness, syncope, weakness, light-headedness and numbness.  Psychiatric/Behavioral: Positive for suicidal ideas. Negative for hallucinations and confusion. The patient is nervous/anxious.     Allergies  Review of patient's allergies indicates no known allergies.  Home Medications   Current Outpatient Rx  Name  Route  Sig  Dispense  Refill  . aspirin 81 MG chewable tablet   Oral   Chew 162 mg by mouth 3 (three) times a week.           BP 188/116  Pulse 75  Temp(Src) 98.6 F (37 C) (Oral)  Resp 18  SpO2 93%  Filed Vitals:   05/15/13 1930 05/15/13 1938 05/15/13 2000 05/15/13 2100  BP: 170/109 170/109 189/119 174/110  Pulse: 58 60 65 67  Temp:      TempSrc:      Resp: 17 16 19 19   SpO2: 100% 100% 100% 100%    Physical Exam  Nursing note and vitals reviewed. Constitutional: He is oriented to person, place, and time. He appears well-developed and well-nourished. No distress.  HENT:  Head: Normocephalic and atraumatic.  Right Ear: External ear normal.  Left Ear: External ear normal.  Nose: Nose normal.  Mouth/Throat: Oropharynx is clear and moist. No oropharyngeal exudate.  No tenderness to palpation to the scalp throughout. TMs gray and translucent bilaterally  Eyes: Conjunctivae and EOM are normal. Pupils are equal, round, and reactive to light. Right eye exhibits no discharge. Left eye exhibits no discharge.  Neck: Normal range of motion. Neck supple.  No tenderness to palpation to the cervical spinal or paraspinal muscle   Cardiovascular: Normal rate, regular rhythm, normal heart sounds and intact distal pulses.  Exam reveals no gallop and no friction rub.   No murmur heard. Dorsalis pedis and radial pulses present bilaterally  Pulmonary/Chest: Effort normal and breath sounds normal. No respiratory distress. He has no wheezes. He has no rales. He exhibits no tenderness.  Abdominal: Soft. Bowel sounds are normal. He exhibits no distension. There is no tenderness. There is no rebound and no guarding.  Musculoskeletal: Normal range of motion. He exhibits edema. He exhibits no tenderness.  1+ bilateral leg edema.  Patient able to ambulate without difficulty or ataxia.  Neurological: He is alert and oriented to person, place, and time. No cranial nerve deficit. Coordination normal.  GCS 15. No cranial or focal neurological deficits.    Skin: Skin is warm and dry. He is not diaphoretic.  Psychiatric:  Patient  has flight of ideas.     ED Course   Procedures (including critical care time)  Labs Reviewed  COMPREHENSIVE METABOLIC PANEL - Abnormal; Notable for the following:    Glucose, Bld 106 (*)    GFR calc non Af Amer 74 (*)    GFR calc Af Amer 86 (*)    All other components within normal limits  SALICYLATE LEVEL - Abnormal; Notable for the following:    Salicylate Lvl <2.0 (*)    All other components within normal limits  ACETAMINOPHEN LEVEL  CBC  ETHANOL  URINE RAPID DRUG SCREEN (HOSP PERFORMED)  POCT I-STAT TROPONIN I   Dg Chest 2 View  05/15/2013   *RADIOLOGY REPORT*  Clinical Data: Hypertension.  CHEST - 2 VIEW  Comparison: 10/09/2011.  Findings: Cardiopericardial silhouette within normal limits for projection.  Stable tortuosity of the thoracic aorta.  The unchanged linear scarring at both lung bases, more prominent at the cardiac apex.  There is no airspace disease.  No pleural effusion. Trachea midline. No pneumothorax.  IMPRESSION: No acute cardiopulmonary disease.  Bibasilar scarring is  unchanged.   Original Report Authenticated By: Andreas Newport, M.D.    DG Chest 2 View (Final result)  Result time: 05/15/13 16:04:52    Final result by Rad Results In Interface (05/15/13 16:04:52)    Narrative:   *RADIOLOGY REPORT*  Clinical Data: Hypertension.  CHEST - 2 VIEW  Comparison: 10/09/2011.  Findings: Cardiopericardial silhouette within normal limits for projection. Stable tortuosity of the thoracic aorta. The unchanged linear scarring at both lung bases, more prominent at the cardiac apex. There is no airspace disease. No pleural effusion. Trachea midline. No pneumothorax.  IMPRESSION: No acute cardiopulmonary disease. Bibasilar scarring is unchanged.   Original Report Authenticated By: Andreas Newport, M.D.       CT Head Wo Contrast (Final result)  Result time: 05/15/13 18:53:02    Final result by Rad Results In Interface (05/15/13 18:53:02)    Narrative:   *RADIOLOGY REPORT*  Clinical Data: Arteriovenous malformation. Elevated blood pressure.  CT HEAD WITHOUT CONTRAST  Technique: Contiguous axial images were obtained from the base of the skull through the vertex without contrast.  Comparison: 10/09/2011.  Findings: No mass lesion, mass effect, midline shift, hydrocephalus, hemorrhage. No territorial ischemia or acute infarction. High attenuation is incidentally noted in the right vertebral artery however this is likely due to beam hardening artifact at the level of the foramen magnum. Paranasal sinuses and calvarium appear within normal limits.  IMPRESSION: Negative CT head.   Original Report Authenticated By: Andreas Newport, M.D.     Date: 05/15/2013  Rate: 85  Rhythm: normal sinus rhythm  QRS Axis: left  Intervals: borderline (PR 196) and QTc (476)  ST/T Wave abnormalities: normal  Conduction Disutrbances:none  Narrative Interpretation:   Old EKG Reviewed: 10/09/11 Sinus rhythm with first degree AV block. Left anterior fascicular  block.    Results for orders placed during the hospital encounter of 05/15/13  ACETAMINOPHEN LEVEL      Result Value Range   Acetaminophen (Tylenol), Serum <15.0  10 - 30 ug/mL  CBC      Result Value Range   WBC 4.9  4.0 - 10.5 K/uL   RBC 5.02  4.22 - 5.81 MIL/uL   Hemoglobin 13.8  13.0 - 17.0 g/dL   HCT 16.1  09.6 - 04.5 %   MCV 79.9  78.0 - 100.0 fL   MCH 27.5  26.0 - 34.0 pg   MCHC 34.4  30.0 - 36.0 g/dL   RDW 45.4  09.8 - 11.9 %   Platelets 269  150 - 400 K/uL  COMPREHENSIVE METABOLIC PANEL      Result Value Range   Sodium 141  135 - 145 mEq/L   Potassium 3.6  3.5 - 5.1 mEq/L   Chloride 104  96 - 112 mEq/L   CO2 28  19 - 32 mEq/L   Glucose, Bld 106 (*) 70 - 99 mg/dL   BUN 11  6 - 23 mg/dL   Creatinine, Ser 1.47  0.50 - 1.35 mg/dL   Calcium 9.9  8.4 - 82.9 mg/dL   Total Protein 7.2  6.0 - 8.3 g/dL   Albumin 4.2  3.5 - 5.2 g/dL   AST 27  0 - 37 U/L   ALT 27  0 - 53 U/L   Alkaline Phosphatase 54  39 - 117 U/L   Total Bilirubin 0.6  0.3 - 1.2 mg/dL   GFR calc non Af Amer 74 (*) >90 mL/min   GFR calc Af Amer 86 (*) >90 mL/min  ETHANOL      Result Value Range   Alcohol, Ethyl (B) <11  0 - 11 mg/dL  SALICYLATE LEVEL      Result Value Range   Salicylate Lvl <2.0 (*) 2.8 - 20.0 mg/dL  URINE RAPID DRUG SCREEN (HOSP PERFORMED)      Result Value Range   Opiates NONE DETECTED  NONE DETECTED   Cocaine NONE DETECTED  NONE DETECTED   Benzodiazepines NONE DETECTED  NONE DETECTED   Amphetamines NONE DETECTED  NONE DETECTED   Tetrahydrocannabinol POSITIVE (*) NONE DETECTED   Barbiturates NONE DETECTED  NONE DETECTED  POCT I-STAT TROPONIN I      Result Value Range   Troponin i, poc 0.01  0.00 - 0.08 ng/mL   Comment 3              No diagnosis found.  MDM  Timothy Jackson is a 42 year old male with a PMH of hypertension and AVM of the spine who presents to the ED for hypertension.  Chest x-ray, EKG, and troponin ordered to further evaluate chest pain. Labs including  acetaminophen level, CBC, CMP, ethanol, salicylate, urine drug screen ordered to evaluate psychological health issues including suicidal ideation.  Head CT ordered to further evaluate headache.  ACT team consult.    Previous records were reviewed.  LVH, EF of 50-55%, LAD has 10-15% proximal stenosis, by Dr. Sharyn Lull (02/24/2010).  Patient previously was on 10 mg amlodipine, 25 mg hydrochlorothiazide, 20 mg lisinopril twice daily, 25 mg metoprolol twice daily.  Patient was given chlorothiazide lisinopril and metoprolol in the emergency department for his hypertension.     Rechecks   7:30 PM = Patient states "I feel great" BP 170/109   Consults  8:00 PM = Patient has ACT team scheduled at 9:30 PM per [facility]    Patient was evaluated in the emergency department for multiple complaints.  Patient expressed thoughts of suicidal ideation. He also exhibited signs and symptoms of paranoia and anxiety.  He will be transferred to Pod C until he can be assessed by the ACT team.  He also was evaluated for elevated blood pressure. He was given his previous blood pressure medications in the emergency department. He will likely need to be discharged home with refills of his blood pressure medications.  Patient's headache resolved throughout his emergency department visit. His head CT was negative for an acute intracranial  process.  Patient's drug screen was positive for cannabis.  Patient also complained of chest tightness over the last 3-4 months. He had no complaints of chest pain or shortness of breath throughout his ED visit.  His troponin was negative, his EKG had no acute ischemic changes, and his chest x-ray was negative for an acute cardiopulmonary process.  Etiology of chest tightness likely due to anxiety.  Patient had no chest tightness throughout his ED visit.  Patient was in agreement with plan.     Final impressions: 1. Suicidal ideation  2. Paranoia  3. Hypertension  4. Headache  5. Anxiety  4.  Cannabinoid abuse     Luiz Iron PA-C    8:45 PM = Care transferred to Dr. Radford Pax via Dr. Julio Sicks, PA-C 05/15/13 2141

## 2013-05-15 NOTE — ED Provider Notes (Addendum)
The patient is a hypertensive 42 year old man who has a history of recent onset of paranoia. 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 The patient has not been taking his blood pressure medications in greater than 6 months. He has fleeting headaches, fleeting chest pain and mild swelling of his legs but at the time of evaluation he has no symptoms. During our discussion the patient expresses these ideas of paranoia at which time he states that he has fleeting chest pain which probably resolved. He has had a normal catheterization in 2011 showing no signs of obstructive disease. He does express ideas of paranoia, he also expresses passive suicidal ideation and will require a psychiatric evaluation for these complaints.   he has been given antihypertensives here and has improved. Holding orders written, psychiatric consultation requested  The blood pressure will need to be monitored this evening while he is awaiting psychiatric consultation. We have ordered for different blood pressure medications for him which he is supposed to be taking at baseline. This is change of shift at 8:45 PM, care was discussed transferred to Dr. Radford Pax  Medical screening examination/treatment/procedure(s) were conducted as a shared visit with non-physician practitioner(s) and myself.  I personally evaluated the patient during the encounter.  Clinical Impression: Hypertension, Paranoia      Vida Roller, MD 05/15/13 2035  Vida Roller, MD 05/15/13 2045

## 2013-05-15 NOTE — ED Notes (Signed)
Pt approached triage desk- stating "Is there someone I can talk with about mental health issues" speaking rapidly,

## 2013-05-15 NOTE — ED Notes (Signed)
Patient transported to CT 

## 2013-05-15 NOTE — ED Notes (Signed)
Pt is here with high blood pressure, tightness in chest, right hand, neck and head.
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
## 2013-05-15 NOTE — ED Notes (Signed)
Pt has been wanded by security. 

## 2013-05-15 NOTE — ED Notes (Signed)
Pt wants to know if he can get some psychological health related to stress,  Pt denies SI/HI

## 2013-05-16 ENCOUNTER — Encounter (HOSPITAL_COMMUNITY): Payer: Self-pay

## 2013-05-16 ENCOUNTER — Inpatient Hospital Stay (HOSPITAL_COMMUNITY)
Admission: AD | Admit: 2013-05-16 | Discharge: 2013-05-25 | DRG: 885 | Disposition: A | Discharge status: Home / Self Care | Payer: No Typology Code available for payment source | Source: Intra-hospital | Attending: Psychiatry | Admitting: Psychiatry

## 2013-05-16 DIAGNOSIS — F22 Delusional disorders: Principal | ICD-10-CM | POA: Diagnosis present

## 2013-05-16 DIAGNOSIS — F200 Paranoid schizophrenia: Secondary | ICD-10-CM

## 2013-05-16 DIAGNOSIS — F411 Generalized anxiety disorder: Secondary | ICD-10-CM | POA: Diagnosis present

## 2013-05-16 DIAGNOSIS — I10 Essential (primary) hypertension: Secondary | ICD-10-CM | POA: Diagnosis present

## 2013-05-16 DIAGNOSIS — F1210 Cannabis abuse, uncomplicated: Secondary | ICD-10-CM | POA: Diagnosis present

## 2013-05-16 HISTORY — DX: Generalized anxiety disorder: F41.1

## 2013-05-16 MED ORDER — ASPIRIN 81 MG PO CHEW
162.0000 mg | CHEWABLE_TABLET | ORAL | Status: DC
  Administered 2013-05-18 – 2013-05-25 (×4): 162 mg via ORAL
  Filled 2013-05-16 (×4): qty 2

## 2013-05-16 MED ORDER — ALUM & MAG HYDROXIDE-SIMETH 200-200-20 MG/5ML PO SUSP: 30.0000 mL | ORAL | Status: DC | PRN

## 2013-05-16 MED ORDER — CLONIDINE HCL 0.1 MG PO TABS
0.1000 mg | ORAL_TABLET | Freq: Once | ORAL | Status: AC
  Administered 2013-05-16: 0.1 mg via ORAL
  Filled 2013-05-16: qty 1

## 2013-05-16 MED ORDER — MAGNESIUM HYDROXIDE 400 MG/5ML PO SUSP: 30.0000 mL | Freq: Every day | ORAL | Status: DC | PRN

## 2013-05-16 MED ORDER — TRAZODONE HCL 50 MG PO TABS
50.0000 mg | ORAL_TABLET | Freq: Every evening | ORAL | Status: DC | PRN
  Filled 2013-05-16 (×3): qty 1

## 2013-05-16 MED ORDER — ZIPRASIDONE HCL 20 MG PO CAPS
20.0000 mg | ORAL_CAPSULE | Freq: Two times a day (BID) | ORAL | Status: DC
  Administered 2013-05-16: 20 mg via ORAL
  Filled 2013-05-16: qty 1

## 2013-05-16 MED ORDER — ACETAMINOPHEN 325 MG PO TABS
650.0000 mg | ORAL_TABLET | Freq: Four times a day (QID) | ORAL | Status: DC | PRN
  Administered 2013-05-18 – 2013-05-24 (×4): 650 mg via ORAL
  Filled 2013-05-16: qty 2

## 2013-05-16 NOTE — Treatment Plan (Signed)
Discussed pt with Dr. Lucianne Muss and she recommended Geodon 20mg  PO BID.  Recommendation has been passed on to pt's RN, Drinda Butts.

## 2013-05-16 NOTE — ED Provider Notes (Signed)
Medical screening examination/treatment/procedure(s) were conducted as a shared visit with non-physician practitioner(s) and myself.  I personally evaluated the patient during the encounter  Please see my separate respective documentation pertaining to this patient encounter   Adisyn Ruscitti D Abby Stines, MD 05/16/13 1514 

## 2013-05-16 NOTE — BH Assessment (Signed)
Per Drinda Butts (patient's nurse), patient has a diagnosis of borderline intellectual disorder. AC-Eric notified but is already aware of this info.   Patient also having incontinence issues. Pt's current care giver/provider is requesting that patient is placed in a ALF upon discharge. AC-Eric informed of this information.   Pt at this time is accepted by Nanine Means, PA based her telepsych assessment completed today.   Minerva Areola will however, run the additional information noted above by Nanine Means, PA to see if patient is still appropriate for a 400 hall bed at [facility].

## 2013-05-16 NOTE — ED Notes (Signed)
PA aware of BP

## 2013-05-16 NOTE — Tx Team (Signed)
Initial Interdisciplinary Treatment Plan  PATIENT STRENGTHS: (choose at least two) Communication skills Motivation for treatment/growth Supportive family/friends  PATIENT STRESSORS: Paranoia   PROBLEM LIST: Problem List/Patient Goals Date to be addressed Date deferred Reason deferred Estimated date of resolution  Psychosis 05/16/13                                                      DISCHARGE CRITERIA:  Improved stabilization in mood, thinking, and/or behavior  PRELIMINARY DISCHARGE PLAN: Outpatient therapy  PATIENT/FAMIILY INVOLVEMENT: This treatment plan has been presented to and reviewed with the patient, Timothy Jackson, and/or family member.  The patient and family have been given the opportunity to ask questions and make suggestions.  Gretta Arab [facility] 05/16/2013, 11:21 PM

## 2013-05-16 NOTE — Progress Notes (Signed)
Timothy Jackson, MHT notes that patients BP has shown significant change from previous recordings, last recording at 0435 was 148/92. Recommendation has been made to stabilize patient medically prior to continuing placement search.

## 2013-05-16 NOTE — ED Notes (Signed)
PT STATES HE IS NOT SUICIDAL . HE IS VERY PARANOID THAT "PEOPLE" ARE TRACKING HIS EVERY MOVE AND ARE TALKING TO "PEOPLE" SAYING DETROMENTAL THINGS ABOUT HIM HINDERING HIS PROGRESS AND SUCCESS AS HE PURSUES AN ACTING CAREER. STATES IT HAS BEEN GOING ON FOR SOME TIME. STATES HE JUST HAS NOT ISOLATED WHO THE "PEOPLE" ARE YET. STATES THE THINGS THEY SAY ARE VERY HURTFUL. STATES WHEN HE ARRIVED AT THE HOSPITAL HE COULD "TELL THEY HAD ALL READY TALKED TO THE RECEPTIONIST IN THE LOBBY" BECAUSE OF THE WAY SHE ACTED. STATES IF YOU ASK HER SHE WILL CONFIRM THIS. STATES THE ONLY REASON HE CAME TO THE HOSPITAL IS THAT HIS FAMILY SUGGESTED HE COME FOR HIS HYPERTENSION BEFORE HE WENT FOR AN EVAL AT [facility] FOR DEPRESSION SINCE IT "RUNS IN MY FAMILY". PATIENT GIVES ONLY SPURTS OF DETAILS DURING EVAL. STATES FREQUENTLY THAT "YOU WOULD THINK I WAS CRAZY IF YOU KNEW WHAT I KNOW". ANIMATELY DENIES SI. STATE "I HAVE A 27 YEAR OLD SON TO RAISE.I WOULD NEVER KILL MYSELF"

## 2013-05-16 NOTE — ED Notes (Signed)
Discussed pt continued elevated diastolic pressure with dr Wilkie Aye. She does not wish to change any meds at this time. States  He has just gotten started on the antihypertensives and needs a few more days before any further adjustments can be made on medications
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
## 2013-05-16 NOTE — Progress Notes (Signed)
Wynetta Emery, MHT was notified by Alphonzo Lemmings, RN) of patients acceptance to [facility]. Writer completed support paperwork with patient and reviewed rules and regulations while at [facility]. Patient signed all consents but did not list individuals to whom he would want information released to. Patient to transported by security and is being arranged by Alphonzo Lemmings, RN all support paperwork and has been faxed to [facility] and original copies provided to [employer], Charity fundraiser.

## 2013-05-16 NOTE — BH Assessment (Signed)
B[facility] Assessment Progress Note  Per Thurman Coyer, RN, Administrative Coordinator, pt has been accepted to [facility] by Learta Codding, PA to the service of Thedore Mins, MD, Rm [phone].  At 20:58 I called the pt's nurse, Whitney, and notified her.  She reports that there is currently a Tech in the ED who will prepare transfer paperwork.  Doylene Canning, MA Triage Specialist 05/16/2013 @ 21:27

## 2013-05-17 MED ORDER — BENZTROPINE MESYLATE 0.5 MG PO TABS
0.5000 mg | ORAL_TABLET | Freq: Every day | ORAL | Status: DC
  Administered 2013-05-19 – 2013-05-20 (×2): 0.5 mg via ORAL
  Filled 2013-05-17 (×5): qty 1

## 2013-05-17 MED ORDER — FLUPHENAZINE HCL 5 MG PO TABS
5.0000 mg | ORAL_TABLET | Freq: Every day | ORAL | Status: DC
  Administered 2013-05-19 – 2013-05-20 (×2): 5 mg via ORAL
  Filled 2013-05-17 (×5): qty 1

## 2013-05-17 MED ORDER — TRAZODONE HCL 100 MG PO TABS
100.0000 mg | ORAL_TABLET | Freq: Every evening | ORAL | Status: DC | PRN
  Administered 2013-05-17: 100 mg via ORAL
  Filled 2013-05-17 (×10): qty 1

## 2013-05-17 MED ORDER — LISINOPRIL 20 MG PO TABS
20.0000 mg | ORAL_TABLET | Freq: Every day | ORAL | Status: DC
  Administered 2013-05-17 – 2013-05-22 (×6): 20 mg via ORAL
  Filled 2013-05-17 (×7): qty 1

## 2013-05-17 NOTE — BHH Group Notes (Signed)
[facility] Group Notes:  (Counselor/Nursing/MHT/Case Management/Adjunct)  05/17/2013 1:15PM  Type of Therapy:  Group Therapy  Participation Level:  Active  Participation Quality:  Appropriate  Affect:  Flat  Cognitive:  Oriented  Insight:  Improving  Engagement in Group:  Limited  Engagement in Therapy:  Limited  Modes of Intervention:  Discussion, Exploration and Socialization  Summary of Progress/Problems: The topic for group was balance in life.  Pt participated in the discussion about when their life was in balance and out of balance and how this feels.  Pt discussed ways to get back in balance and short term goals they can work on to get where they want to be.  Timothy Jackson states he is unbalanced "because no one believes my story."  Is not sure he should be here because of this.  I pointed out that family outside of here does not believe either, which means he is surrounded by unbelief.  He agreed.  Later stated he gets balance from son and ex-wife, as well as walking and smoking lots of marijuana.  Timothy Jackson 05/17/2013 4:32 PM

## 2013-05-17 NOTE — H&P (Signed)
Psychiatric Admission Assessment Adult  Patient Identification:  Timothy Jackson Date of Evaluation:  05/17/2013 Chief Complaint:  PSYCHOTIC DISORDER NOS History of Present Illness: Timothy Jackson is a 42 year old single male who presented to [facility] at the urging of his family for treatment of physical complaints and persecutory delusions. The patient presents as very paranoid during the initial assessment by writer stating "Is it confidential here. I need to tell my story to get it off my chest but I don't want to tell it to the wrong person. I'm here to discredit my story. 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 I was getting all kind of messages on facebook. But then people started blocking everything I did and following me. I could not figure out who was doing it. Now I think I have. I had no idea that my family was against me. They are part of the conspiracy. They have all worked in the [facility]. They have been calling to find out about me and I can't believe the staff has been talking to me. I think they want to stop me from revealing special family secrets. My grandfather was involved in the conspiracy about Ignacia Bayley and was killed shortly after him." The patient is very tangential and wants to "sit down and tell my whole story for hours to get it off my chest like my Uncle suggested." He is very suspicious and seems to doubt that Dr. Jannifer Franklin is in fact a qualified MD. The patient is worried about who writer "will tell this information to" and offers on several occasions to get the movie he made out of his bookbag. 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 The patient required frequent redirection to complete basic  questions related to the assessment.  Elements:  Location:  [facility] in-patient. Quality:  Severe paranioa and delusions.. Severity:  Afraid that his family is conspiring against him. Timing:  Last few months. Duration:  Goes back to early adult years.. Context:  Untreated mental illness. Associated Signs/Synptoms: Depression Symptoms:  Denies (Hypo) Manic Symptoms:  Denies Anxiety Symptoms:  Excessive Worry, Psychotic Symptoms:  Delusions, Paranoia, PTSD Symptoms: Denies  Psychiatric Specialty Exam: Physical Exam  Constitutional: He appears well-developed and well-nourished.    Review of Systems  Constitutional: Negative.  Negative for fever, chills, weight loss and malaise/fatigue.  HENT: Negative.  Negative for hearing loss, ear pain, neck pain, tinnitus and ear discharge.   Eyes: Negative.  Negative for blurred vision, double vision, photophobia, pain and discharge.  Respiratory: Negative.  Negative for cough, hemoptysis and sputum production.   Cardiovascular: Negative.  Negative for chest pain, palpitations, orthopnea and claudication.  Gastrointestinal: Negative.  Negative for heartburn, nausea, vomiting, abdominal pain and diarrhea.  Genitourinary: Negative.  Negative for dysuria, urgency and frequency.  Musculoskeletal: Negative.  Negative for myalgias and back pain.  Skin: Negative.  Negative for itching and rash.  Neurological: Negative.  Negative for dizziness, tingling, tremors, sensory change and headaches.  Endo/Heme/Allergies: Negative.  Negative for environmental allergies. Does not bruise/bleed easily.  Psychiatric/Behavioral: Positive for depression and substance abuse. Negative for suicidal ideas, hallucinations and memory loss. The patient is nervous/anxious and has insomnia.  Blood pressure 160/101, pulse 80, temperature 98.6 F (37 C), temperature source Oral, resp. rate 20, height 5\' 9"  (1.753 m), weight 123.378 kg (272 lb), SpO2 98.00%.Body mass index is  40.15 kg/(m^2).  General Appearance: Casual  Eye Contact::  Good  Speech:  Clear and Coherent  Volume:  Normal  Mood:  Anxious and Irritable  Affect:  Full Range  Thought Process:  Irrelevant  Orientation:  Full (Time, Place, and Person)  Thought Content:  Delusions, Paranoid Ideation and Rumination  Suicidal Thoughts:  No  Homicidal Thoughts:  No  Memory:  Immediate;   Good Recent;   Good Remote;   Good  Judgement:  Poor  Insight:  Shallow  Psychomotor Activity:  Restlessness  Concentration:  Fair  Recall:  Fair  Akathisia:  No  Handed:  Right  AIMS (if indicated):     Assets:  Communication Skills Desire for Improvement Leisure Time Resilience Social Support  Sleep:  Number of Hours: 5.5    Past Psychiatric History:Denies Diagnosis:Denies  Hospitalizations:Denies  Outpatient Care:Denies  Substance Abuse Care:Denies  Self-Mutilation:Denies  Suicidal Attempts:Denies  Violent Behaviors:Denies   Past Medical History:   Past Medical History  Diagnosis Date  . Hypertension   . AVM (arteriovenous malformation) spine     MRI on 04/20/2000  . Deaf     Right ear  . History of cardiac cath 02/24/2010     LVH, EF of 50-55%, LAD has 10-15% proximal stenosis, by Dr. Sharyn Lull  . Anxiety, generalized   . Erectile dysfunction    None. Allergies:  No Known Allergies PTA Medications: Prescriptions prior to admission  Medication Sig Dispense Refill  . aspirin 81 MG chewable tablet Chew 162 mg by mouth 3 (three) times a week.         Previous Psychotropic Medications:  Medication/Dose  Denies               Substance Abuse History in the last 12 months:  yes  Consequences of Substance Abuse: Patient admits to occasional THC use.   Social History:  reports that he has been smoking.  He does not have any smokeless tobacco history on file. He reports that  drinks alcohol. He reports that he uses illicit drugs (Marijuana). Additional Social History:                       Current Place of Residence:   Place of Birth:   Family Members: Marital Status:  Single Children:  Sons:  Daughters: Relationships: Education:  "I went through the 11 th grade". Educational Problems/Performance: Religious Beliefs/Practices: History of Abuse (Emotional/Phsycial/Sexual) Occupational Experiences; Military History:  None. Legal History: Hobbies/Interests:  Family History:   Family History  Problem Relation Age of Onset  . Hypertension Father   . Coronary artery disease Maternal Grandmother   . Hypertension Maternal Grandmother   . Diabetes Maternal Grandmother   . Coronary artery disease Maternal Uncle   . Hypertension Maternal Uncle   . Diabetes Maternal Aunt   . Leukemia Maternal Uncle     Results for orders placed during the hospital encounter of 05/15/13 (from the past 72 hour(s))  ACETAMINOPHEN LEVEL     Status: None   Collection Time    05/15/13  3:11 PM      Result Value Range   Acetaminophen (Tylenol), Serum <15.0  10 - 30 ug/mL   Comment:            THERAPEUTIC CONCENTRATIONS VARY  SIGNIFICANTLY. A RANGE OF 10-30     ug/mL MAY BE AN EFFECTIVE     CONCENTRATION FOR MANY PATIENTS.     HOWEVER, SOME ARE BEST TREATED     AT CONCENTRATIONS OUTSIDE THIS     RANGE.     ACETAMINOPHEN CONCENTRATIONS     >150 ug/mL AT 4 HOURS AFTER     INGESTION AND >50 ug/mL AT 12     HOURS AFTER INGESTION ARE     OFTEN ASSOCIATED WITH TOXIC     REACTIONS.  CBC     Status: None   Collection Time    05/15/13  3:11 PM      Result Value Range   WBC 4.9  4.0 - 10.5 K/uL   RBC 5.02  4.22 - 5.81 MIL/uL   Hemoglobin 13.8  13.0 - 17.0 g/dL   HCT 16.1  09.6 - 04.5 %   MCV 79.9  78.0 - 100.0 fL   MCH 27.5  26.0 - 34.0 pg   MCHC 34.4  30.0 - 36.0 g/dL   RDW 40.9  81.1 - 91.4 %   Platelets 269  150 - 400 K/uL  COMPREHENSIVE METABOLIC PANEL     Status: Abnormal   Collection Time    05/15/13  3:11 PM      Result Value Range   Sodium 141  135 -  145 mEq/L   Potassium 3.6  3.5 - 5.1 mEq/L   Chloride 104  96 - 112 mEq/L   CO2 28  19 - 32 mEq/L   Glucose, Bld 106 (*) 70 - 99 mg/dL   BUN 11  6 - 23 mg/dL   Creatinine, Ser 7.82  0.50 - 1.35 mg/dL   Calcium 9.9  8.4 - 95.6 mg/dL   Total Protein 7.2  6.0 - 8.3 g/dL   Albumin 4.2  3.5 - 5.2 g/dL   AST 27  0 - 37 U/L   ALT 27  0 - 53 U/L   Alkaline Phosphatase 54  39 - 117 U/L   Total Bilirubin 0.6  0.3 - 1.2 mg/dL   GFR calc non Af Amer 74 (*) >90 mL/min   GFR calc Af Amer 86 (*) >90 mL/min   Comment: (NOTE)     The eGFR has been calculated using the CKD EPI equation.     This calculation has not been validated in all clinical situations.     eGFR's persistently <90 mL/min signify possible Chronic Kidney     Disease.  ETHANOL     Status: None   Collection Time    05/15/13  3:11 PM      Result Value Range   Alcohol, Ethyl (B) <11  0 - 11 mg/dL   Comment:            LOWEST DETECTABLE LIMIT FOR     SERUM ALCOHOL IS 11 mg/dL     FOR MEDICAL PURPOSES ONLY  SALICYLATE LEVEL     Status: Abnormal   Collection Time    05/15/13  3:11 PM      Result Value Range   Salicylate Lvl <2.0 (*) 2.8 - 20.0 mg/dL  POCT I-STAT TROPONIN I     Status: None   Collection Time    05/15/13  3:33 PM      Result Value Range   Troponin i, poc 0.01  0.00 - 0.08 ng/mL   Comment 3            Comment: Due to  the release kinetics of cTnI,     a negative result within the first hours     of the onset of symptoms does not rule out     myocardial infarction with certainty.     If myocardial infarction is still suspected,     repeat the test at appropriate intervals.  URINE RAPID DRUG SCREEN (HOSP PERFORMED)     Status: Abnormal   Collection Time    05/15/13  6:50 PM      Result Value Range   Opiates NONE DETECTED  NONE DETECTED   Cocaine NONE DETECTED  NONE DETECTED   Benzodiazepines NONE DETECTED  NONE DETECTED   Amphetamines NONE DETECTED  NONE DETECTED   Tetrahydrocannabinol POSITIVE (*) NONE  DETECTED   Barbiturates NONE DETECTED  NONE DETECTED   Comment:            DRUG SCREEN FOR MEDICAL PURPOSES     ONLY.  IF CONFIRMATION IS NEEDED     FOR ANY PURPOSE, NOTIFY LAB     WITHIN 5 DAYS.                LOWEST DETECTABLE LIMITS     FOR URINE DRUG SCREEN     Drug Class       Cutoff (ng/mL)     Amphetamine      1000     Barbiturate      200     Benzodiazepine   200     Tricyclics       300     Opiates          300     Cocaine          300     THC              50   Psychological Evaluations:  Assessment:   DSM5:  Schizophrenia Disorders:  Schizophrenia (295.7) Obsessive-Compulsive Disorders:  N/A Trauma-Stressor Disorders:  N/A Substance/Addictive Disorders:  Cannabis Use Disorder - Mild (305.20) Depressive Disorders:  N/A  AXIS I:  Delusional disorder. R/O  Paranoid Schizophrenia AXIS II:  Deferred AXIS III:   Past Medical History  Diagnosis Date  . Hypertension   . AVM (arteriovenous malformation) spine     MRI on 04/20/2000  . Deaf     Right ear  . History of cardiac cath 02/24/2010     LVH, EF of 50-55%, LAD has 10-15% proximal stenosis, by Dr. Sharyn Lull  . Anxiety, generalized   . Erectile dysfunction    AXIS IV:  economic problems, housing problems, other psychosocial or environmental problems and problems with primary support group AXIS V:  41-50 serious symptoms   Treatment Plan/Recommendations:   1. Admit for crisis management and stabilization. Estimated length of stay 5-7 days. 2. Medication management to reduce current symptoms to base line and improve the patient's level of functioning. Started on Prolixin 5 mg at hs for psychotic symptoms and Cogentin 0.5 mg at hs to prevent EPS. Trazodone 50 mg hs initiated to help improve sleep. 3. Develop treatment plan to decrease risk of relapse upon discharge of psychotic symptoms and the need for readmission. 5. Group therapy to facilitate development of healthy coping skills to use for depression and  anxiety. 6. Health care follow up as needed for medical problems. Patient continued on Lisinopril 20 mg from [facility] for management of his HTN. 7. Discharge plan to include therapy to help patient cope with stressor of chronic mental illness.  8. Call  for Consult with Hospitalist for additional specialty patient services as needed.   Treatment Plan Summary: Daily contact with patient to assess and evaluate symptoms and progress in treatment Medication management Current Medications:  Current Facility-Administered Medications  Medication Dose Route Frequency Provider Last Rate Last Dose  . acetaminophen (TYLENOL) tablet 650 mg  650 mg Oral Q6H PRN Kerry Hough, PA-C      . alum & mag hydroxide-simeth (MAALOX/MYLANTA) 200-200-20 MG/5ML suspension 30 mL  30 mL Oral Q4H PRN Kerry Hough, PA-C      . [START ON 05/18/2013] aspirin chewable tablet 162 mg  162 mg Oral 3 times weekly Kerry Hough, PA-C      . benztropine (COGENTIN) tablet 0.5 mg  0.5 mg Oral QHS Eulonda Andalon      . fluPHENAZine (PROLIXIN) tablet 5 mg  5 mg Oral QHS Maston Wight      . lisinopril (PRINIVIL,ZESTRIL) tablet 20 mg  20 mg Oral Daily Fransisca Kaufmann, NP      . magnesium hydroxide (MILK OF MAGNESIA) suspension 30 mL  30 mL Oral Daily PRN Kerry Hough, PA-C      . traZODone (DESYREL) tablet 100 mg  100 mg Oral QHS,MR X 1 Kyler Lerette        Observation Level/Precautions:  15 minute checks  Laboratory:  CBC Chemistry Profile  Psychotherapy:  Group Sessions  Medications:  See list  Consultations:  As needed  Discharge Concerns:  Safety and Stability  Estimated LOS: 5-7 days  Other:  Obtain collateral information from family   I certify that inpatient services furnished can reasonably be expected to improve the patient's condition.   Fransisca Kaufmann NP-C 8/21/201411:18 AM
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
## 2013-05-17 NOTE — Tx Team (Signed)
  Interdisciplinary Treatment Plan Update   Date Reviewed:  05/17/2013  Time Reviewed:  11:18 AM  Progress in Treatment:   Attending groups: Yes Participating in groups: Yes Taking medication as prescribed: Yes  Tolerating medication: Yes Family/Significant other contact made: No Patient understands diagnosis: No  Limited insight Discussing patient identified problems/goals with staff: Yes  See initial plan Medical problems stabilized or resolved: Yes Denies suicidal/homicidal ideation: Yes  In tx team Patient has not harmed self or others: Yes  For review of initial/current patient goals, please see plan of care.  Estimated Length of Stay:  4-5 days  Reason for Continuation of Hospitalization: Delusions  Medication stabilization  New Problems/Goals identified:  N/A  Discharge Plan or Barriers:   unknown  Additional Comments:  Timothy Jackson is a 42 year old single male who presented to [facility] at the urging of his family for treatment of physical complaints and persecutory delusions. The patient presents as very paranoid during the initial assessment by writer stating "Is it confidential here. I need to tell my story to get it off my chest but I don't want to tell it to the wrong person. I'm here to discredit my story. 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 I was getting all kind of messages on facebook. But then people started blocking everything I did and following me. I could not figure out who was doing it. Now I think I have. I had no idea that my family was against me. They are part of the conspiracy. They have all worked in the [facility]. They have been calling to find out about me and I can't believe the staff has been talking to me   Attendees:  Signature: Thedore Mins, MD 05/17/2013 11:18 AM   Signature: Richelle Ito, LCSW 05/17/2013 11:18 AM  Signature: Fransisca Kaufmann, NP 05/17/2013 11:18 AM  Signature: Gerrianne Scale  05/17/2013 11:18 AM  Signature:  05/17/2013 11:18 AM  Signature:  05/17/2013 11:18 AM  Signature:   05/17/2013 11:18 AM  Signature:    Signature:    Signature:    Signature:    Signature:    Signature:      Scribe for Treatment Team:   Richelle Ito, LCSW  05/17/2013 11:18 AM

## 2013-05-18 DIAGNOSIS — F22 Delusional disorders: Principal | ICD-10-CM | POA: Diagnosis present

## 2013-05-18 DIAGNOSIS — F1210 Cannabis abuse, uncomplicated: Secondary | ICD-10-CM

## 2013-05-18 DIAGNOSIS — F411 Generalized anxiety disorder: Secondary | ICD-10-CM | POA: Diagnosis present

## 2013-05-18 MED ORDER — CLONIDINE HCL 0.1 MG PO TABS
0.1000 mg | ORAL_TABLET | Freq: Four times a day (QID) | ORAL | Status: DC | PRN
  Administered 2013-05-18 – 2013-05-21 (×6): 0.1 mg via ORAL
  Filled 2013-05-18: qty 1

## 2013-05-18 MED ORDER — CLONIDINE HCL 0.2 MG PO TABS
0.2000 mg | ORAL_TABLET | Freq: Once | ORAL | Status: AC
  Administered 2013-05-18: 0.2 mg via ORAL
  Filled 2013-05-18: qty 1

## 2013-05-18 NOTE — Progress Notes (Signed)
D:  Per pt self inventory pt reports sleeping fair, appetite good, energy level low, ability to pay attention good, pt still having paranoid delusions that "my family and organizations have been following me", denies pain, pt is very focused on the delusions and it seems as though this is the only thing he wants to talk about even in groups. Denies SI/HI/AVH at this time.   A:  Emotional support provided, Encouraged pt to continue with treatment plan and attend all group activities, q15 min checks maintained for safety.  R:  Pt is calm, cooperative, attending groups.

## 2013-05-18 NOTE — Progress Notes (Signed)
Patient ID: Timothy Jackson, male   DOB: [birth year], 42 y.o.   MRN: [id number]    D: Pt observed sleeping in bed with eyes closed. RR even and unlabored. No distress noted  A: Q 15 minute checks were done for safety.  R: safety maintained on unit.

## 2013-05-18 NOTE — Progress Notes (Signed)
[facility] MD Progress Note  05/18/2013 10:53 AM Timothy Jackson  MRN:  [id number] Subjective: "People are accusing me of being a child molester and I think my family is behind my inability to get opportunity in life." Objective: Patient reports that he was advised to come to the psychiatric hospital by his Uncle in order to get certain stuffs off his chest. He starts by saying that he is not crazy but he believes that some people are behind his inability to get ahead in life. He also maintain that some organizations are watching him. He also says that people have been accusing him of being on crack and being a child molester, he believes that he has not been able to get job opportunities because of that kind of accusations. Patient reports that he was in [place] for a while last year and was evaluated by 2 psychiatrist in [place], [place], [place] for the same problem. However, patient reports excessive worries, anxiety, feeling edgy, muscle tension and difficulty sleeping due to worries. He denies alcohol  Abuse but says he smoke marijuana occasionally. 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   HENT: Negative.   Eyes: Negative.   Respiratory: Negative.   Cardiovascular: Negative.   Gastrointestinal: Negative.   Genitourinary: Negative.   Musculoskeletal: Negative.   Skin: Negative.   Neurological: Negative.   Endo/Heme/Allergies: Negative.   Psychiatric/Behavioral: The patient is nervous/anxious.     Blood pressure 156/96, pulse 80, temperature 98.6 F (37 C), temperature source Oral, resp. rate 20,  height 5\' 9"  (1.753 m), weight 123.378 kg (272 lb), SpO2 98.00%.Body mass index is 40.15 kg/(m^2).  General Appearance: Fairly Groomed  Patent attorney::  Minimal  Speech:  Clear and Coherent  Volume:  Normal  Mood:  Anxious  Affect:  anxious  Thought Process:  Disorganized  Orientation:  Full (Time, Place, and Person)  Thought Content:  Delusions and Paranoid Ideation  Suicidal Thoughts:  No  Homicidal Thoughts:  No  Memory:  Immediate;   Fair Recent;   Fair Remote;   Fair  Judgement:  Impaired  Insight:  Shallow  Psychomotor Activity:  Normal  Concentration:  Fair  Recall:  Fair  Akathisia:  No  Handed:  Right  AIMS (if indicated):     Assets:  Communication Skills Desire for Improvement  Sleep:  Number of Hours: 5.25   Current Medications: Current Facility-Administered Medications  Medication Dose Route Frequency Provider Last Rate Last Dose  . acetaminophen (TYLENOL) tablet 650 mg  650 mg Oral Q6H PRN Kerry Hough, PA-C   650 mg at 05/18/13 9562  . alum & mag hydroxide-simeth (MAALOX/MYLANTA) 200-200-20 MG/5ML suspension 30 mL  30 mL Oral Q4H PRN Kerry Hough, PA-C      . aspirin chewable tablet 162 mg  162 mg Oral 3 times weekly Kerry Hough, PA-C   162 mg at 05/18/13 0754  . benztropine (COGENTIN) tablet 0.5 mg  0.5 mg Oral QHS Rashena Dowling      . cloNIDine (CATAPRES) tablet 0.1 mg  0.1 mg Oral Q6H PRN Fransisca Kaufmann, NP      . cloNIDine (CATAPRES) tablet 0.2 mg  0.2 mg Oral Once Fransisca Kaufmann, NP      . fluPHENAZine (PROLIXIN) tablet 5 mg  5 mg Oral QHS Taevion Sikora      . lisinopril (PRINIVIL,ZESTRIL) tablet 20 mg  20 mg Oral Daily Fransisca Kaufmann, NP   20 mg at 05/18/13 0754  . magnesium hydroxide (MILK OF MAGNESIA) suspension 30 mL  30 mL Oral Daily PRN Kerry Hough, PA-C      . traZODone (DESYREL) tablet 100 mg  100 mg Oral QHS,MR X 1 Khayden Herzberg   100 mg at 05/17/13 2213    Lab Results: No results found for this or any previous visit (from the past 48  hour(s)).  Physical Findings: AIMS: Facial and Oral Movements Muscles of Facial Expression: None, normal Lips and Perioral Area: None, normal Jaw: None, normal Tongue: None, normal,Extremity Movements Upper (arms, wrists, hands, fingers): None, normal Lower (legs, knees, ankles, toes): None, normal, Trunk Movements Neck, shoulders, hips: None, normal, Overall Severity Severity of abnormal movements (highest score from questions above): None, normal Incapacitation due to abnormal movements: None, normal Patient's awareness of abnormal movements (rate only patient's report): No Awareness, Dental Status Current problems with teeth and/or dentures?: No Does patient usually wear dentures?: No  CIWA:    COWS:     Treatment Plan Summary: Daily contact with patient to assess and evaluate symptoms and progress in treatment Medication management  Plan:1. Admit for crisis management and stabilization. 2. Medication management to reduce current symptoms to base line and improve the     patient's overall level of functioning 3. Treat health problems as indicated. 4. Develop treatment plan to decrease risk of relapse upon discharge and the need for     readmission. 5. Psycho-social education regarding relapse prevention and self care. 6. Health care follow up as needed for medical problems. 7. Restart home medications where appropriate. 8. Continue current treatment 9. Case manager to obtain collateral information from patient's family.   Medical Decision Making Problem Points:  Established problem, worsening (2), Review of last therapy session (1) and Review of psycho-social stressors (1) Data Points:  Order Aims Assessment (2) Review of medication regiment & side effects (2) Review of new medications or change in dosage (2)  I certify that inpatient services furnished can reasonably be expected to improve the patient's condition.   Thedore Mins, MD 05/18/2013, 10:53 AM

## 2013-05-18 NOTE — [facility] Suicide Risk Assessment (Signed)
[facility] INPATIENT:  Family/Significant Other Suicide Prevention Education  Suicide Prevention Education:  Education Completed; Ms Plotkin, mother, [phone]  has been identified by the patient as the family member/significant other with whom the patient will be residing, and identified as the person(s) who will aid the patient in the event of a mental health crisis (suicidal ideations/suicide attempt).  With written consent from the patient, the family member/significant other has been provided the following suicide prevention education, prior to the and/or following the discharge of the patient.  The suicide prevention education provided includes the following:  Suicide risk factors  Suicide prevention and interventions  National Suicide Hotline telephone number  [facility] assessment telephone number  [facility] Emergency Assistance 911  [facility] and/or Residential Mobile Crisis Unit telephone number  Request made of family/significant other to:  Remove weapons (e.g., guns, rifles, knives), all items previously/currently identified as safety concern.    Remove drugs/medications (over-the-counter, prescriptions, illicit drugs), all items previously/currently identified as a safety concern.  The family member/significant other verbalizes understanding of the suicide prevention education information provided.  The family member/significant other agrees to remove the items of safety concern listed above.  Daryel Gerald B 05/18/2013, 2:10 PM

## 2013-05-18 NOTE — Progress Notes (Addendum)
Recreation Therapy Notes   Date: 08.22.2014 Time: 9:30am Location: [address] Dayroom  Group Topic: Wellness  Goal Area(s) Addresses:  Patient will define components of whole wellness. Patient will verbalize benefit of whole wellness.  Behavioral Response: Observer  Intervention: Trivia Game  Activity: Health and Wellness [employer]. Patients were asked various questions relating to health and wellness.    Education: Discharge Planning, Wellness  Education Outcome: Acknowledges understanding   Clinical Observations/Feedback: Patient attended group session, but did not participate in group activity. LRT invited patient to participate, however patient decline LRT invitation.   Marykay Lex Keston Seever, LRT/CTRS  Jearl Klinefelter 05/18/2013 11:36 AM

## 2013-05-18 NOTE — Progress Notes (Signed)
Adult Psychoeducational Group Note  Date:  05/18/2013 Time:  10:13 PM  Group Topic/Focus:  Wrap-Up Group:   The focus of this group is to help patients review their daily goal of treatment and discuss progress on daily workbooks.  Participation Level:  Active  Participation Quality:  Appropriate  Affect:  Anxious and Appropriate  Cognitive:  Appropriate  Insight: Improving  Engagement in Group:  Developing/Improving  Modes of Intervention:  Discussion  Additional Comments:  Patient stated that today he has been sleepy, anxious. Patient expressed concern about his elevated blood pressure. Patient's goal is to "come closer to a resolution."   Melvin Whiteford R 05/18/2013, 10:13 PM

## 2013-05-18 NOTE — Progress Notes (Signed)
Patient ID: Timothy Jackson, male   DOB: 1970/10/17, 42 y.o.   MRN: [id number]  D: Pt denies SI/HI/AVH. Pt is pleasant and cooperative. Pt concerned about HTN.  Pt continues to be delusional, but was more focused on his high blood pressure tonight.   A: Pt was offered support and encouragement. Pt was offered scheduled medications. Pt was encourage to attend groups. Q 15 minute checks were done for safety.   R:Pt attends groups and interacts well with peers and staff. Pt is  Not taking scheduled  Medication, pt only taking Bp meds. Pt has no complaints at this time.Pt receptive to treatment and safety maintained on unit.
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
## 2013-05-18 NOTE — [facility] Group Notes (Signed)
[facility] LCSW Aftercare Discharge Planning Group Note   05/18/2013 2:11 PM  Participation Quality:  Engaged  Mood/Affect:  Depressed  Depression Rating:    Anxiety Rating:    Thoughts of Suicide:  No Will you contract for safety?   NA  Current AVH:  Denies, but still perseverating on delusion of sabatoge by family  Plan for Discharge/Comments:  Maxey presents as depressed today.  He is giving minimal eye contact and answering with one word answers.  Decided it is OK to call his mother today for collateral information, and in fact is insistent that we call ASAP.  Transportation Means: family  Supports: family  [place], Baldo Daub

## 2013-05-19 MED ORDER — HYDROCHLOROTHIAZIDE 25 MG PO TABS
25.0000 mg | ORAL_TABLET | Freq: Every day | ORAL | Status: DC
  Administered 2013-05-19 – 2013-05-25 (×7): 25 mg via ORAL
  Filled 2013-05-19 (×10): qty 1

## 2013-05-19 MED ORDER — HYDROXYZINE HCL 25 MG PO TABS
25.0000 mg | ORAL_TABLET | Freq: Three times a day (TID) | ORAL | Status: DC
  Administered 2013-05-19 – 2013-05-25 (×18): 25 mg via ORAL
  Filled 2013-05-19 (×5): qty 1
  Filled 2013-05-19: qty 42
  Filled 2013-05-19 (×3): qty 1
  Filled 2013-05-19: qty 42
  Filled 2013-05-19 (×10): qty 1
  Filled 2013-05-19: qty 42
  Filled 2013-05-19 (×3): qty 1

## 2013-05-19 MED ORDER — POTASSIUM CHLORIDE CRYS ER 10 MEQ PO TBCR
10.0000 meq | EXTENDED_RELEASE_TABLET | Freq: Every day | ORAL | Status: DC
  Administered 2013-05-19 – 2013-05-25 (×7): 10 meq via ORAL
  Filled 2013-05-19 (×2): qty 1
  Filled 2013-05-19: qty 14
  Filled 2013-05-19 (×7): qty 1

## 2013-05-19 NOTE — Progress Notes (Signed)
Adult Psychoeducational Group Note  Date:  05/19/2013 Time:  10:04 PM  Group Topic/Focus:  Wrap-Up Group:   The focus of this group is to help patients review their daily goal of treatment and discuss progress on daily workbooks.  Participation Level:  Active  Participation Quality:  Appropriate and Sharing  Affect:  Appropriate  Cognitive:  Appropriate  Insight: Improving  Engagement in Group:  Off Topic  Modes of Intervention:  Discussion  Additional Comments:  Patient shared his satisfaction with his new medication. Patient shared that the new medication makes him "want to hug people." Patient admitted to getting upset and stressing over the smallest things. Patient was talkative and appeared to show characteristics of euphoria.   Lyndee Hensen 05/19/2013, 10:04 PM

## 2013-05-19 NOTE — Progress Notes (Signed)
Patient ID: Timothy Jackson, male   DOB: [birth year], 42 y.o.   MRN: [id number] D: Client is visible on the unit and is minimally interactive with select male peers. Affect is restricted; describes mood is anxious; thought process continues to be tangential, delusional and preoccupied; eye contact brief; appearance is WNL. Client is pleasant during 1:1 interaction, but anxious about blood pressure. Denies SI/HI.   A: Continue to encourage group attendance and participation, offer support and assistance in identifying triggers and development of coping skills and encouraged client to share thoughts and feelings to staff. Maintained safety through q 15 minute safety checks by staff.   R: Client is medication compliant and verbalizes safety to staff. Client stated, "You can overdose on blood pressure meds, can you?"

## 2013-05-19 NOTE — Progress Notes (Signed)
[facility] MD Progress Note  05/19/2013 5:02 PM Timothy Jackson  MRN:  [id number] Subjective:  Patient remains delusional.  He states he has two phones in his belongings with one stating "It is all lies."  When asked what the lies were, he refused to tell anyone except the doctor because he has already told too many people.  He denies depression, suicidal/homicidal ideations, and hallucinations.  His anxiety is "out the roof".  Blood pressure medications adjusted and HCTZ with potassium added.  He is pleasant and engages easily despite his delusions and paranoia.    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 Hypertension   . AVM (arteriovenous malformation) spine     MRI on 04/20/2000  . Deaf     Right ear  . History of cardiac cath 02/24/2010     LVH, EF of 50-55%, LAD has 10-15% proximal stenosis, by Dr. Sharyn Lull  . Anxiety, generalized   . Erectile dysfunction    Axis IV: other psychosocial or environmental problems, problems related to social environment and problems with primary support group Axis V: 41-50 serious symptoms  ADL's:  Intact  Sleep: Poor  Appetite:  Fair  Suicidal Ideation:  Denies Homicidal Ideation:  Denies  Psychiatric Specialty Exam: Review of Systems  Constitutional: Negative.   HENT: Negative.   Eyes: Negative.   Respiratory: Negative.   Cardiovascular: Negative.   Gastrointestinal: Negative.   Genitourinary: Negative.   Musculoskeletal: Negative.   Skin: Negative.   Neurological: Negative.   Endo/Heme/Allergies: Negative.   Psychiatric/Behavioral: The patient is nervous/anxious.     Blood pressure 156/99, pulse 80, temperature 97 F (36.1 C), temperature source Oral, resp. rate 18, height 5\' 9"  (1.753 m), weight 123.378 kg (272 lb), SpO2 98.00%.Body mass index is 40.15 kg/(m^2).  General Appearance: Casual  Eye Contact::  Minimal  Speech:  Normal  Rate  Volume:  Normal  Mood:  Anxious  Affect:  Congruent  Thought Process:  Coherent  Orientation:  Full (Time, Place, and Person)  Thought Content:  Delusions, paranoid  Suicidal Thoughts:  No  Homicidal Thoughts:  No  Memory:  Immediate;   Fair Recent;   Fair Remote;   Fair  Judgement:  Impaired  Insight:  Lacking  Psychomotor Activity:  Normal  Concentration:  Fair  Recall:  Fair  Akathisia:  No  Handed:  Right  AIMS (if indicated):     Assets:  Resilience  Sleep:  Number of Hours: 6   Current Medications: Current Facility-Administered Medications  Medication Dose Route Frequency Provider Last Rate Last Dose  . acetaminophen (TYLENOL) tablet 650 mg  650 mg Oral Q6H PRN Kerry Hough, PA-C   650 mg at 05/18/13 1191  . alum & mag hydroxide-simeth (MAALOX/MYLANTA) 200-200-20 MG/5ML suspension 30 mL  30 mL Oral Q4H PRN Kerry Hough, PA-C      . aspirin chewable tablet 162 mg  162 mg Oral 3 times weekly Kerry Hough, PA-C   162 mg at 05/18/13 0754  . benztropine (COGENTIN) tablet 0.5 mg  0.5 mg Oral QHS Mojeed Akintayo      . cloNIDine (CATAPRES) tablet 0.1 mg  0.1 mg Oral Q6H PRN Fransisca Kaufmann, NP   0.1 mg at 05/19/13 0701  . fluPHENAZine (PROLIXIN) tablet 5 mg  5 mg Oral QHS Mojeed Akintayo      . hydrochlorothiazide (HYDRODIURIL) tablet 25 mg  25 mg Oral Daily Nanine Means, NP      . lisinopril (PRINIVIL,ZESTRIL) tablet 20 mg  20 mg Oral Daily Fransisca Kaufmann, NP   20 mg at 05/19/13 5956  . magnesium hydroxide (MILK OF MAGNESIA) suspension 30 mL  30 mL Oral Daily PRN Kerry Hough, PA-C      . potassium chloride (K-DUR,KLOR-CON) CR tablet 10 mEq  10 mEq Oral Daily Nanine Means, NP      . traZODone (DESYREL) tablet 100 mg  100 mg Oral QHS,MR X 1 Mojeed Akintayo   100 mg at 05/17/13 2213    Lab Results: No results found for this or any previous visit (from the past 48 hour(s)).  Physical Findings: AIMS: Facial and Oral Movements Muscles of Facial Expression: None,  normal Lips and Perioral Area: None, normal Jaw: None, normal Tongue: None, normal,Extremity Movements Upper (arms, wrists, hands, fingers): None, normal Lower (legs, knees, ankles, toes): None, normal, Trunk Movements Neck, shoulders, hips: None, normal, Overall Severity Severity of abnormal movements (highest score from questions above): None, normal Incapacitation due to abnormal movements: None, normal Patient's awareness of abnormal movements (rate only patient's report): No Awareness, Dental Status Current problems with teeth and/or dentures?: No Does patient usually wear dentures?: No  CIWA:    COWS:     Treatment Plan Summary: Daily contact with patient to assess and evaluate symptoms and progress in treatment Medication management  Plan:  Review of chart, vital signs, medications, and notes. 1-Individual and group therapy 2-Medication management for depression and anxiety:  Medications reviewed with the patient and no psychiatric medications changed except Vistaril ordered TID anxiety 3-Coping skills for anxiety and delusions 4-Continue crisis stabilization and management 5-Address health issues--monitoring vital signs, HTN--added HCTZ 25 mg with potassium, Vistaril should decrease his anxiety and subsequently his HTN 6-Treatment plan in progress to prevent relapse of delusions and anxiety  Medical Decision Making Problem Points:  Established problem, stable/improving (1) and Review of psycho-social stressors (1) Data Points:  Review of new medications or change in dosage (2)  I certify that inpatient services furnished can reasonably be expected to improve the patient's condition.   Nanine Means, PMH-NP 05/19/2013, 5:02 PM

## 2013-05-19 NOTE — [facility] Group Notes (Signed)
[facility] Group Notes:  (Clinical Social Work)  05/19/2013  11:15-11:45AM  Summary of Progress/Problems:   The main focus of today's process group was for the patient to identify ways in which they have in the past sabotaged their own recovery and reasons they may have done this/what they received from doing it.  We then worked to identify a specific plan to avoid doing this when discharged from the hospital for this admission.  The patient expressed that he self-sabotages by trying to fix everything, and that he uses social media excessively, putting too much information "out there."  Type of Therapy:  Group Therapy - Process  Participation Level:  Active  Participation Quality:  Attentive and Sharing  Affect:  Anxious and Blunted  Cognitive:  Appropriate  Insight:  Developing/Improving  Engagement in Therapy:  Engaged  Modes of Intervention:  Clarification, Education, Exploration, Discussion  Ambrose Mantle, LCSW 05/19/2013, 12:00 PM

## 2013-05-19 NOTE — Progress Notes (Signed)
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 "My pressure is up because I know what I have to do now to get my career back on track but I'm in here". C/o feeling tired with no energy.  A- Blood pressure elevated, educated on the importance of taking medications and low salt diet. Encouraged pt to share feelings during 1:1.  R-Continue to monitor on q15 min checks for safety .

## 2013-05-20 MED ORDER — HYDROXYZINE HCL 50 MG PO TABS
50.0000 mg | ORAL_TABLET | Freq: Every evening | ORAL | Status: DC | PRN
  Administered 2013-05-20 – 2013-05-24 (×7): 50 mg via ORAL
  Filled 2013-05-20 (×3): qty 1
  Filled 2013-05-20: qty 28
  Filled 2013-05-20 (×10): qty 1
  Filled 2013-05-20: qty 28
  Filled 2013-05-20 (×2): qty 1

## 2013-05-20 NOTE — Progress Notes (Signed)
Patient ID: Timothy Jackson, male   DOB: Apr 20, 1971, 42 y.o.   MRN: [id number] [facility] MD Progress Note  05/20/2013 3:45 PM Timothy Jackson  MRN:  [id number]  Subjective:  Timothy Jackson reports, "I'm napping now because I did not sleep well last night. My family sent me here because they were concerned about me. I'm going through some things. They want me to stay here and sort it all out. But what is it am I going to sort out when no one will believe me. Sometimes, I think may be it will be better to just not be here no more, then that will get people to find out the truth and then solve the problems. I'm not saying that I'm suicidal because I'm not going to kill myself, I have a 8 year old son. I can't do him like that. Who is that walking around on the hall way? Do you see?  May be they are trying to listen to my conversation. That is what I keep telling every body".  O: Timothy Jackson is sitting up on his bed during this assessment. He is making eye good contact, however remains paranoid.  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 Hypertension   . AVM (arteriovenous malformation) spine     MRI on 04/20/2000  . Deaf     Right ear  . History of cardiac cath 02/24/2010     LVH, EF of 50-55%, LAD has 10-15% proximal stenosis, by Dr. Sharyn Lull  . Anxiety, generalized   . Erectile dysfunction    Axis IV: other psychosocial or environmental problems, problems related to social environment and problems with primary support group Axis V: 41-50 serious symptoms  ADL's:  Intact  Sleep: Poor, per patient reports, however, documentation indicated 6 hours.  Appetite:  Fair  Suicidal Ideation:  Denies Homicidal Ideation:  Denies  Psychiatric Specialty Exam: Review of Systems  Constitutional: Negative.   HENT: Negative.   Eyes: Negative.   Respiratory: Negative.   Cardiovascular: Negative.   Gastrointestinal:  Negative.   Genitourinary: Negative.   Musculoskeletal: Negative.   Skin: Negative.   Neurological: Negative.   Endo/Heme/Allergies: Negative.   Psychiatric/Behavioral: The patient is nervous/anxious.     Blood pressure 140/99, pulse 83, temperature 97.5 F (36.4 C), temperature source Oral, resp. rate 20, height 5\' 9"  (1.753 m), weight 123.378 kg (272 lb), SpO2 98.00%.Body mass index is 40.15 kg/(m^2).  General Appearance: Casual  Eye Contact::  Minimal  Speech:  Normal Rate  Volume:  Normal  Mood:  Anxious, depressed  Affect:  Congruent  Thought Process:  Coherent  Orientation:  Full (Time, Place, and Person)  Thought Content:  Delusions, paranoid ideation.  Suicidal Thoughts:  No  Homicidal Thoughts:  No  Memory:  Immediate;   Fair Recent;   Fair Remote;   Fair  Judgement:  Impaired  Insight:  Lacking  Psychomotor Activity:  Normal  Concentration:  Fair  Recall:  Fair  Akathisia:  No  Handed:  Right  AIMS (if indicated):     Assets:  Resilience  Sleep:  Number of Hours: 6   Current Medications: Current Facility-Administered Medications  Medication Dose Route Frequency Provider Last Rate Last Dose  . acetaminophen (TYLENOL) tablet 650 mg  650 mg Oral Q6H PRN Kerry Hough, PA-C   650 mg at 05/18/13 7829  . alum & mag hydroxide-simeth (MAALOX/MYLANTA) 200-200-20  MG/5ML suspension 30 mL  30 mL Oral Q4H PRN Kerry Hough, PA-C      . aspirin chewable tablet 162 mg  162 mg Oral 3 times weekly Kerry Hough, PA-C   162 mg at 05/18/13 0754  . benztropine (COGENTIN) tablet 0.5 mg  0.5 mg Oral QHS Mojeed Akintayo   0.5 mg at 05/19/13 2135  . cloNIDine (CATAPRES) tablet 0.1 mg  0.1 mg Oral Q6H PRN Fransisca Kaufmann, NP   0.1 mg at 05/20/13 1255  . fluPHENAZine (PROLIXIN) tablet 5 mg  5 mg Oral QHS Mojeed Akintayo   5 mg at 05/19/13 2135  . hydrochlorothiazide (HYDRODIURIL) tablet 25 mg  25 mg Oral Daily Nanine Means, NP   25 mg at 05/20/13 4098  . hydrOXYzine (ATARAX/VISTARIL)  tablet 25 mg  25 mg Oral TID Nanine Means, NP   25 mg at 05/20/13 1255  . lisinopril (PRINIVIL,ZESTRIL) tablet 20 mg  20 mg Oral Daily Fransisca Kaufmann, NP   20 mg at 05/20/13 1191  . magnesium hydroxide (MILK OF MAGNESIA) suspension 30 mL  30 mL Oral Daily PRN Kerry Hough, PA-C      . potassium chloride (K-DUR,KLOR-CON) CR tablet 10 mEq  10 mEq Oral Daily Nanine Means, NP   10 mEq at 05/20/13 4782  . traZODone (DESYREL) tablet 100 mg  100 mg Oral QHS,MR X 1 Mojeed Akintayo   100 mg at 05/17/13 2213    Lab Results: No results found for this or any previous visit (from the past 48 hour(s)).  Physical Findings: AIMS: Facial and Oral Movements Muscles of Facial Expression: None, normal Lips and Perioral Area: None, normal Jaw: None, normal Tongue: None, normal,Extremity Movements Upper (arms, wrists, hands, fingers): None, normal Lower (legs, knees, ankles, toes): None, normal, Trunk Movements Neck, shoulders, hips: None, normal, Overall Severity Severity of abnormal movements (highest score from questions above): None, normal Incapacitation due to abnormal movements: None, normal Patient's awareness of abnormal movements (rate only patient's report): No Awareness, Dental Status Current problems with teeth and/or dentures?: No Does patient usually wear dentures?: No  CIWA:    COWS:     Treatment Plan Summary: Daily contact with patient to assess and evaluate symptoms and progress in treatment Medication management  Plan:  Supportive approach/coping skills/relapse prevention. Encouraged out of room, participation in group sessions and application of coping skills when distressed. Will continue to monitor response to/adverse effects of medications in use to assure effectiveness. Continue to monitor mood, behavior and interaction with staff and other patients. Continue current plan of care.  Medical Decision Making Problem Points:  Established problem, stable/improving (1) and Review of  psycho-social stressors (1) Data Points:  Review of new medications or change in dosage (2)  I certify that inpatient services furnished can reasonably be expected to improve the patient's condition.   Armandina Stammer I, PMH-NP 05/20/2013, 3:45 PM

## 2013-05-20 NOTE — Progress Notes (Signed)
Assumed care of pt at 2300. Pt has continued to rest without difficulty. RR WNL, even and unlabored. No distress observed. Level III obs in place and pt remains safe. Lawrence [place]

## 2013-05-20 NOTE — Progress Notes (Signed)
Pt pleasant on approach. Reports having a good day and is experiencing relief from anxiety from the vistaril. Some sleepiness associated with it but is reassured that it should subside as his body adjusts. Asked that his trazadone be d/c and vistaril initiated for sleep. PA phoned and orders received for vistaril for sleep. Supported, encouraged. Denies SI/HI/AVH and remains safe.Timothy Jackson

## 2013-05-20 NOTE — Progress Notes (Signed)
Patient ID: Timothy Jackson, male   DOB: 22-Aug-1971, 42 y.o.   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 Pt cooperative with staff, attended group sessions and is compliant with medications.  A: Monitor pt Q 15 minutes for safety, encourage staff/peer interaction and group participation. Administer medications as ordered. R: No inappropriate behaviors noted.

## 2013-05-20 NOTE — [facility] Group Notes (Signed)
[facility] Group Notes: (Clinical Social Work)   05/20/2013      Type of Therapy:  Group Therapy   Participation Level:  Did Not Attend    Ambrose Mantle, LCSW 05/20/2013, 1:23 PM

## 2013-05-21 DIAGNOSIS — F411 Generalized anxiety disorder: Secondary | ICD-10-CM

## 2013-05-21 DIAGNOSIS — F29 Unspecified psychosis not due to a substance or known physiological condition: Secondary | ICD-10-CM

## 2013-05-21 MED ORDER — BENZTROPINE MESYLATE 1 MG PO TABS
1.0000 mg | ORAL_TABLET | Freq: Every day | ORAL | Status: DC
  Administered 2013-05-24: 1 mg via ORAL
  Filled 2013-05-21 (×5): qty 1
  Filled 2013-05-21: qty 14

## 2013-05-21 MED ORDER — FLUPHENAZINE HCL 5 MG PO TABS
10.0000 mg | ORAL_TABLET | Freq: Every day | ORAL | Status: DC
  Administered 2013-05-24: 10 mg via ORAL
  Filled 2013-05-21 (×2): qty 1
  Filled 2013-05-21: qty 28
  Filled 2013-05-21 (×3): qty 1

## 2013-05-21 NOTE — Progress Notes (Signed)
D: Pt denies SI/HI/AVH. Pt is paranoid this morning. Pt stated, "It's some crazy shit going on". Writer asked pt to specify but pt would not go into details. Pt has poor insight. Forwards little information. Pt compliant with taking meds and attending groups. No complaints verbalized by pt. A: Medications administered as ordered per MD. Medication increased (prolixin). Verbal support given. Pt encouraged to attend groups. 15 minute checks performed for safety. R: Pt remains safe at this time.

## 2013-05-21 NOTE — Progress Notes (Signed)
Recreation Therapy Notes  Date: 08.25.2014 Time: 9:30am Location: [address] Dayroom  Group Topic: Leisure Education  Goal Area(s) Addresses:  Patient will verbalize activity of interest by end of group session. Patient will verbalize the ability to use positive leisure/recreation as a coping mechanism.  Behavioral Response: Drowsy, Engaged, Appropriate  Intervention: Adapted Game  Activity: Words of Leisure. LRT wrote the words "Recreation & Leisure" on the white board in the dayroom. Patients were asked to create as many words as possible used the letters of "Recreation & Leisure."   Education:  Leisure Education, Discharge Planning  Education Outcome: Needs additional education  Clinical Observations/Feedback: Patient appeared to sleep during first half of group session, leaning his head on the wall behind him and closing his eyes. At approximately 9:45am patient engagd in activity, stating appropriate words to correspond with the letters in "Recreation & Leisure."     [place], LRT/CTRS  Jearl Klinefelter 05/21/2013 12:39 PM

## 2013-05-21 NOTE — Progress Notes (Addendum)
Patient ID: Timothy Jackson, male   DOB: 03-29-1971, 42 y.o.   MRN: [id number] [facility] MD Progress Note  05/21/2013 1:31 PM LORA GLOMSKI  MRN:  [id number]  Subjective:  Patient states "The medication is helping my anxiety but that worries me. I should be thinking more about how to deal with my situation that is very serious. There are people waiting to kill me after I leave. And I don't like talking about things in front of these other patients. You know they get on the telephone and talk about everything they hear. I don't think anybody around here is taking me serious."  O: Patient remains paranoid, suspicious and easily agitated. The patient became very anxious during conversation with Clinical research associate and was encouraged to relax due to elevated blood pressure today.   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 Hypertension   . AVM (arteriovenous malformation) spine     MRI on 04/20/2000  . Deaf     Right ear  . History of cardiac cath 02/24/2010     LVH, EF of 50-55%, LAD has 10-15% proximal stenosis, by Dr. Sharyn Lull  . Anxiety, generalized   . Erectile dysfunction    Axis IV: other psychosocial or environmental problems, problems related to social environment and problems with primary support group Axis V: 50-60  ADL's:  Intact  Sleep: Poor, per patient reports, however, documentation indicated 6 hours.  Appetite:  Fair  Suicidal Ideation:  Denies Homicidal Ideation:  Denies  Psychiatric Specialty Exam: Review of Systems  Constitutional: Negative.   HENT: Negative.   Eyes: Negative.   Respiratory: Negative.   Cardiovascular: Negative.   Gastrointestinal: Negative.   Genitourinary: Negative.   Musculoskeletal: Negative.   Skin: Negative.   Neurological: Negative.   Endo/Heme/Allergies: Negative.   Psychiatric/Behavioral: The patient is nervous/anxious.     Blood pressure  150/100, pulse 63, temperature 97.8 F (36.6 C), temperature source Oral, resp. rate 20, height 5\' 9"  (1.753 m), weight 123.378 kg (272 lb), SpO2 98.00%.Body mass index is 40.15 kg/(m^2).  General Appearance: Casual  Eye Contact::  Minimal  Speech:  Normal Rate  Volume:  Normal  Mood:  Anxious, depressed  Affect:  Congruent  Thought Process:  Coherent  Orientation:  Full (Time, Place, and Person)  Thought Content:  Delusions, paranoid ideation.  Suicidal Thoughts:  No  Homicidal Thoughts:  No  Memory:  Immediate;   Fair Recent;   Fair Remote;   Fair  Judgement:  Impaired  Insight:  Lacking  Psychomotor Activity:  Normal  Concentration:  Fair  Recall:  Fair  Akathisia:  No  Handed:  Right  AIMS (if indicated):     Assets:  Resilience  Sleep:  Number of Hours: 6   Current Medications: Current Facility-Administered Medications  Medication Dose Route Frequency Provider Last Rate Last Dose  . acetaminophen (TYLENOL) tablet 650 mg  650 mg Oral Q6H PRN Kerry Hough, PA-C   650 mg at 05/18/13 4132  . alum & mag hydroxide-simeth (MAALOX/MYLANTA) 200-200-20 MG/5ML suspension 30 mL  30 mL Oral Q4H PRN Kerry Hough, PA-C      . aspirin chewable tablet 162 mg  162 mg Oral 3 times weekly Kerry Hough, PA-C   162 mg at 05/21/13 0817  . benztropine (COGENTIN) tablet 1 mg  1 mg Oral QHS Mojeed Akintayo      .  cloNIDine (CATAPRES) tablet 0.1 mg  0.1 mg Oral Q6H PRN Fransisca Kaufmann, NP   0.1 mg at 05/21/13 1610  . fluPHENAZine (PROLIXIN) tablet 10 mg  10 mg Oral QHS Mojeed Akintayo      . hydrochlorothiazide (HYDRODIURIL) tablet 25 mg  25 mg Oral Daily Nanine Means, NP   25 mg at 05/21/13 0816  . hydrOXYzine (ATARAX/VISTARIL) tablet 25 mg  25 mg Oral TID Nanine Means, NP   25 mg at 05/21/13 1146  . hydrOXYzine (ATARAX/VISTARIL) tablet 50 mg  50 mg Oral QHS,MR X 1 Court Joy, PA-C   50 mg at 05/20/13 2200  . lisinopril (PRINIVIL,ZESTRIL) tablet 20 mg  20 mg Oral Daily Fransisca Kaufmann, NP    20 mg at 05/21/13 0817  . magnesium hydroxide (MILK OF MAGNESIA) suspension 30 mL  30 mL Oral Daily PRN Kerry Hough, PA-C      . potassium chloride (K-DUR,KLOR-CON) CR tablet 10 mEq  10 mEq Oral Daily Nanine Means, NP   10 mEq at 05/21/13 0800    Lab Results: No results found for this or any previous visit (from the past 48 hour(s)).  Physical Findings: AIMS: Facial and Oral Movements Muscles of Facial Expression: None, normal Lips and Perioral Area: None, normal Jaw: None, normal Tongue: None, normal,Extremity Movements Upper (arms, wrists, hands, fingers): None, normal Lower (legs, knees, ankles, toes): None, normal, Trunk Movements Neck, shoulders, hips: None, normal, Overall Severity Severity of abnormal movements (highest score from questions above): None, normal Incapacitation due to abnormal movements: None, normal Patient's awareness of abnormal movements (rate only patient's report): No Awareness, Dental Status Current problems with teeth and/or dentures?: No Does patient usually wear dentures?: No  CIWA:    COWS:     Treatment Plan Summary: Daily contact with patient to assess and evaluate symptoms and progress in treatment Medication management  Plan:  Supportive approach/coping skills/relapse prevention. Encouraged out of room, participation in group sessions and application of coping skills when distressed. Will continue to monitor response to/adverse effects of medications in use to assure effectiveness. Continue to monitor mood, behavior and interaction with staff and other patients. Continue current plan of care. Increase Prolixin to 10 mg daily for paranoid delusions and Cogentin 1 mg at hs for EPS. Continue Vistaril 25 mg po TID to address symptoms of anxiety related to paranoia.   Medical Decision Making Problem Points:  Established problem, stable/improving (1) and Review of psycho-social stressors (1) Data Points:  Review of new medications or change in  dosage (2)  I certify that inpatient services furnished can reasonably be expected to improve the patient's condition.   Fransisca Kaufmann, NP-C 05/21/2013, 1:31 PM

## 2013-05-21 NOTE — [facility] Group Notes (Signed)
[facility] LCSW Aftercare Discharge Planning Group Note   05/21/2013 11:12 AM  Participation Quality:  Did not attend    [place]

## 2013-05-21 NOTE — Progress Notes (Signed)
Adult Psychoeducational Group Note  Date:  05/21/2013 Time:  4:24 AM  Group Topic/Focus:  Wrap-Up Group:   The focus of this group is to help patients review their daily goal of treatment and discuss progress on daily workbooks.  Participation Level:  Active  Participation Quality:  Appropriate  Affect:  Appropriate  Cognitive:  Appropriate  Insight: Appropriate  Engagement in Group:  Engaged  Modes of Intervention:  Support  Additional Comments:  Patient attended and participated in group tonight. He reports having a good day. Throughout the day he felt "high" he believe that may be due to his new medication. Today he checked his voicemail, went to his meals and attended his groups. He advised that people are looking for him. His support system are friends.  Lita Mains [facility] 05/21/2013, 4:24 AM

## 2013-05-21 NOTE — [facility] Group Notes (Signed)
[facility] LCSW Group Therapy  05/21/2013 1:15 pm  Type of Therapy: Process Group Therapy  Participation Level: Invited, chose not to attend    Summary of Progress/Problems: Today's group addressed the issue of overcoming obstacles.  Patients were asked to identify their biggest obstacle post d/c that stands in the way of their on-going success, and then problem solve as to how to manage this.  Timothy Jackson B 05/21/2013   4:40 PM

## 2013-05-22 MED ORDER — CLONIDINE HCL 0.1 MG PO TABS
0.1000 mg | ORAL_TABLET | Freq: Three times a day (TID) | ORAL | Status: DC
  Administered 2013-05-22 – 2013-05-23 (×4): 0.1 mg via ORAL
  Filled 2013-05-22 (×6): qty 1

## 2013-05-22 MED ORDER — LISINOPRIL 20 MG PO TABS
20.0000 mg | ORAL_TABLET | Freq: Once | ORAL | Status: AC
  Administered 2013-05-22: 20 mg via ORAL
  Filled 2013-05-22 (×2): qty 1

## 2013-05-22 MED ORDER — LISINOPRIL 40 MG PO TABS
40.0000 mg | ORAL_TABLET | Freq: Every day | ORAL | Status: DC
  Administered 2013-05-23 – 2013-05-25 (×3): 40 mg via ORAL
  Filled 2013-05-22: qty 14
  Filled 2013-05-22 (×4): qty 1

## 2013-05-22 NOTE — [facility] Group Notes (Signed)
[facility] LCSW Group Therapy  05/22/2013 , 5:34 PM   Type of Therapy:  Group Therapy  Participation Level:  Active  Participation Quality:  Attentive  Affect:  Appropriate  Cognitive:  Alert  Insight:  Limited  Engagement in Therapy:  Limited  Modes of Intervention:  Discussion, Exploration and Socialization  Summary of Progress/Problems: Today's group focused on the term Diagnosis.  Participants were asked to define the term, and then pronounce whether it is a negative, positive or neutral term.  Deklin contributed to the discussion by talking about the fact that he is mis-diagnosed.  Of course, he believes he has no mental health disorder, and that the problem is his family is all conspiring against him.  Of course, he was also able to follow along with more general discussion because when it does not involve his family, he can be neutral and even somewhat insightful.  Ida Rogue 05/22/2013 , 5:34 PM

## 2013-05-22 NOTE — Progress Notes (Addendum)
Patient ID: Timothy Jackson, male   DOB: 04/21/1971, 42 y.o.   MRN: [id number]  [facility] MD Progress Note  05/22/2013 2:46 PM Timothy Jackson  MRN:  [id number]  Subjective:  Patient states "I'm feeling calmer. I decided to just let my situation rest in God's hands. 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 Per the [facility] he refused his increased dose of Prolixin last night as patient believes the medicine is interfering with his ability to deal with feeling that his family is sabotaging his efforts to become famous.   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 Hypertension   . AVM (arteriovenous malformation) spine     MRI on 04/20/2000  . Deaf     Right ear  . History of cardiac cath 02/24/2010     LVH, EF of 50-55%, LAD has 10-15% proximal stenosis, by Dr. Sharyn Lull  . Anxiety, generalized   . Erectile dysfunction    Axis IV: other psychosocial or environmental problems, problems related to social environment and problems with primary support group Axis V: 50-60 Moderate Symptoms  ADL's:  Intact  Sleep: Poor, per patient reports, however, documentation indicated 6 hours.  Appetite:  Fair  Suicidal Ideation:  Denies Homicidal Ideation:  Denies  Psychiatric Specialty Exam: Review of Systems  Constitutional: Negative.   HENT: Negative.   Eyes: Negative.   Respiratory: Negative.   Cardiovascular: Negative.   Gastrointestinal: Negative.   Genitourinary: Negative.   Musculoskeletal: Negative.   Skin: Negative.   Neurological: Negative.   Endo/Heme/Allergies: Negative.   Psychiatric/Behavioral: Positive for depression. The patient is nervous/anxious.     Blood pressure 160/98, pulse 80, temperature 98 F (36.7 C), temperature source Oral, resp. rate 18, height 5\' 9"  (1.753 m),  weight 123.378 kg (272 lb), SpO2 98.00%.Body mass index is 40.15 kg/(m^2).  General Appearance: Casual  Eye Contact::  Minimal  Speech:  Normal Rate  Volume:  Normal  Mood:  Anxious, depressed  Affect:  Congruent  Thought Process:  Coherent  Orientation:  Full (Time, Place, and Person)  Thought Content:  Delusions, paranoid ideation.  Suicidal Thoughts:  No  Homicidal Thoughts:  No  Memory:  Immediate;   Fair Recent;   Fair Remote;   Fair  Judgement:  Impaired  Insight:  Lacking  Psychomotor Activity:  Normal  Concentration:  Fair  Recall:  Fair  Akathisia:  No  Handed:  Right  AIMS (if indicated):     Assets:  Resilience  Sleep:  Number of Hours: 5.5   Current Medications: Current Facility-Administered Medications  Medication Dose Route Frequency Provider Last Rate Last Dose  . acetaminophen (TYLENOL) tablet 650 mg  650 mg Oral Q6H PRN Kerry Hough, PA-C   650 mg at 05/21/13 2307  . alum & mag hydroxide-simeth (MAALOX/MYLANTA) 200-200-20 MG/5ML suspension 30 mL  30 mL Oral Q4H PRN Kerry Hough, PA-C      . aspirin chewable tablet 162 mg  162 mg Oral 3 times weekly Kerry Hough, PA-C   162 mg at 05/21/13 0817  . benztropine (COGENTIN) tablet 1 mg  1 mg Oral QHS Mojeed Akintayo      . cloNIDine (CATAPRES) tablet 0.1 mg  0.1 mg Oral TID Kerry Hough, PA-C   0.1 mg  at 05/22/13 1300  . fluPHENAZine (PROLIXIN) tablet 10 mg  10 mg Oral QHS Mojeed Akintayo      . hydrochlorothiazide (HYDRODIURIL) tablet 25 mg  25 mg Oral Daily Nanine Means, NP   25 mg at 05/22/13 6578  . hydrOXYzine (ATARAX/VISTARIL) tablet 25 mg  25 mg Oral TID Nanine Means, NP   25 mg at 05/22/13 1300  . hydrOXYzine (ATARAX/VISTARIL) tablet 50 mg  50 mg Oral QHS,MR X 1 Court Joy, PA-C   50 mg at 05/21/13 2308  . [START ON 05/23/2013] lisinopril (PRINIVIL,ZESTRIL) tablet 40 mg  40 mg Oral Daily Fransisca Kaufmann, NP      . magnesium hydroxide (MILK OF MAGNESIA) suspension 30 mL  30 mL Oral Daily PRN  Kerry Hough, PA-C      . potassium chloride (K-DUR,KLOR-CON) CR tablet 10 mEq  10 mEq Oral Daily Nanine Means, NP   10 mEq at 05/22/13 4696    Lab Results: No results found for this or any previous visit (from the past 48 hour(s)).  Physical Findings: AIMS: Facial and Oral Movements Muscles of Facial Expression: None, normal Lips and Perioral Area: None, normal Jaw: None, normal Tongue: None, normal,Extremity Movements Upper (arms, wrists, hands, fingers): None, normal Lower (legs, knees, ankles, toes): None, normal, Trunk Movements Neck, shoulders, hips: None, normal, Overall Severity Severity of abnormal movements (highest score from questions above): None, normal Incapacitation due to abnormal movements: None, normal Patient's awareness of abnormal movements (rate only patient's report): No Awareness, Dental Status Current problems with teeth and/or dentures?: No Does patient usually wear dentures?: No  CIWA:    COWS:     Treatment Plan Summary: Daily contact with patient to assess and evaluate symptoms and progress in treatment Medication management  Plan:  Supportive approach/coping skills/relapse prevention. Encouraged out of room, participation in group sessions and application of coping skills when distressed. Will continue to monitor response to/adverse effects of medications in use to assure effectiveness. Continue to monitor mood, behavior and interaction with staff and other patients. Continue current plan of care. Continue Prolixin 10 mg daily for paranoid delusions and Cogentin 1 mg at hs for EPS. Will encourage patient to be compliant with medications. Continue Vistaril 25 mg po TID to address symptoms of anxiety related to paranoia.  Address health issues: Increase Lisinopril 40 mg daily for elevated BP.   Medical Decision Making Problem Points:  Established problem, stable/improving (1) and Review of psycho-social stressors (1) Data Points:  Review of new  medications or change in dosage (2)  I certify that inpatient services furnished can reasonably be expected to improve the patient's condition.   Fransisca Kaufmann, NP-C 05/22/2013, 2:46 PM

## 2013-05-22 NOTE — Tx Team (Signed)
  Interdisciplinary Treatment Plan Update   Date Reviewed:  05/22/2013  Time Reviewed:  2:21 PM  Progress in Treatment:   Attending groups: Yes Participating in groups: Yes Taking medication as prescribed: Yes  Tolerating medication: Yes Family/Significant other contact made: Yes  Patient understands diagnosis: Yes  Discussing patient identified problems/goals with staff: Yes Medical problems stabilized or resolved: Yes Denies suicidal/homicidal ideation: Yes Patient has not harmed self or others: Yes  For review of initial/current patient goals, please see plan of care.  Estimated Length of Stay:  D/C tomorrow  Reason for Continuation of Hospitalization: Medication stabilization  New Problems/Goals identified:  N/A  Discharge Plan or Barriers:   go to the shelter, follow up outpt at [facility]  Additional Comments:  Attendees:  Signature: Thedore Mins, MD 05/22/2013 2:21 PM   Signature: Richelle Ito, LCSW 05/22/2013 2:21 PM  Signature: Fransisca Kaufmann, NP 05/22/2013 2:21 PM  Signature: Joslyn Devon, RN 05/22/2013 2:21 PM  Signature: Liborio Nixon, RN 05/22/2013 2:21 PM  Signature:  05/22/2013 2:21 PM  Signature:   05/22/2013 2:21 PM  Signature:    Signature:    Signature:    Signature:    Signature:    Signature:      Scribe for Treatment Team:   Richelle Ito, LCSW  05/22/2013 2:21 PM

## 2013-05-22 NOTE — Progress Notes (Signed)
Patient ID: Timothy Jackson, male   DOB: Sep 30, 1970, 42 y.o.   MRN: [id number]   D: Pt was more superficial today than previous day. Didn't discuss conversations held with Dr or other staff. However, Clinical research associate informed pt that due to increased blood pressures, writer needed a current set of vitals. Pt's bp was 143/94, 80, 98.6 and manually 152/105.  Pt called extender and informed of bp.  A:  Support and encouragement was offered. 15 min checks continued for safety.  R: Pt remains safe.

## 2013-05-22 NOTE — [facility] Group Notes (Signed)
Adult Psychoeducational Group Note  Date:  05/22/2013 Time:  9:57 PM  Group Topic/Focus:  Wrap-Up Group:   The focus of this group is to help patients review their daily goal of treatment and discuss progress on daily workbooks.  Participation Level:  Active  Participation Quality:  Appropriate  Affect:  Appropriate  Cognitive:  Appropriate  Insight: Appropriate  Engagement in Group:  Engaged  Modes of Intervention:  Discussion  Additional Comments:  Francesco expressed that he had a good day.  He went on to say that one of his medicines was making him feel high.  He also stated that he found out a friend of his from school works here.  He talked with his peers and learned something new through those conversations.  Lastly, he expressed that he was thankful to see another day.  Caroll Rancher A 05/22/2013, 9:57 PM

## 2013-05-23 MED ORDER — METOPROLOL TARTRATE 25 MG PO TABS
25.0000 mg | ORAL_TABLET | Freq: Two times a day (BID) | ORAL | Status: DC
  Administered 2013-05-23 – 2013-05-24 (×2): 25 mg via ORAL
  Filled 2013-05-23 (×4): qty 1

## 2013-05-23 MED ORDER — CLONIDINE HCL 0.1 MG PO TABS
0.1000 mg | ORAL_TABLET | Freq: Three times a day (TID) | ORAL | Status: DC | PRN
  Administered 2013-05-24 (×2): 0.1 mg via ORAL
  Filled 2013-05-23: qty 2

## 2013-05-23 NOTE — Progress Notes (Signed)
Seen and agreed. Mizuki Hoel, MD 

## 2013-05-23 NOTE — Progress Notes (Signed)
Agree with assessment and plan Kei Langhorst A. Damany Eastman, M.D. 

## 2013-05-23 NOTE — Progress Notes (Signed)
Recreation Therapy Notes  Date: 08.27.2014 Time: 9:30am Location: [address] Dayroom  Group Topic: Coping Skills  Goal Area(s) Addresses:  Patient will effectively use art as a means of self-expression.  Patient will identify a positive emotion associated with self-expression.   Behavioral Response: Engaged, Appropriate  Intervention: Art, Vizualization  Activity: My happy place. Patients were asked to visualize a place in their minds that promotes calm or a place where they would experience no stress. Patients were then asked to draw what this place looks like to them. Classical music was played to enhance therapeutic environment.   Education: Coping Skills  Education Outcome: Acknowledges understanding  Clinical Observations/Feedback: Patient participated in group session. Before beginning work on his drawing patient was observed to close his eyes and play along with the music that was being played. Patient appeared to enjoy classical music. Patient completed his drawing, but chose not to share it with group. Patient interacted appropriately with peers, offering support and encouragement when necessary.   Marykay Lex Brittin Belnap, LRT/CTRS  Nandita Mathenia L 05/23/2013 12:30 PM

## 2013-05-23 NOTE — Progress Notes (Signed)
Patient ID: Timothy Jackson, male   DOB: 1970-11-06, 42 y.o.   MRN: [id number]  D: Writer asked pt about his day. Pt stated "I get out on Friday". "What's your plan?" "To get the hell out of here". Stated, he's getting with a friend from [facility] for a lie detector test. For now pt states he plans to return home.  A:  Support and encouragement was offered. 15 min checks continued for safety.  R: Pt remains safe.

## 2013-05-23 NOTE — Progress Notes (Signed)
D: Patient pleasant and cooperative with staff. Patient's affect/mood is anxious. Declined self inventory sheet today. He's participating in groups. Patient is compliant with medication regimen. He voiced earlier this morning that last night he saw a nurse in an all white uniform, like that of during the 60's. Patient stated, "I know she wasn't real because she went through the wall", "so maybe she was a spirit or ghost".  A: Support and encouragement provided to patient. Administered scheduled medications per ordering MD. Monitor Q15 minute checks for safety.  R: Patient receptive. Denies SI/HI/AVH. Patient remains safe on the unit.

## 2013-05-23 NOTE — Progress Notes (Signed)
Patient ID: Timothy Jackson, male   DOB: 04/24/1971, 42 y.o.   MRN: [id number]  [facility] MD Progress Note  05/23/2013 10:38 AM Timothy Jackson  MRN:  [id number]  Subjective:"Please, can you give me a lie detector test? Nobody believe my story."  Objective: Patient still hold on to his believe that his family and some unnamed organization are behind his failure in life. He is requesting for a lie detector test to prove his point. However, he reports decreased anxiety but remains  guarded and paranoid. Patient  has been taking his medication regularly and did not verbalize any adverse reactions to that. Diagnosis:   DSM5: Axis I: Delusional disorder, NOS Axis II: Deferred Axis III:  Past Medical History  Diagnosis Date  . Hypertension   . AVM (arteriovenous malformation) spine     MRI on 04/20/2000  . Deaf     Right ear  . History of cardiac cath 02/24/2010     LVH, EF of 50-55%, LAD has 10-15% proximal stenosis, by Dr. Sharyn Lull  . Erectile dysfunction    Axis IV: other psychosocial or environmental problems, problems related to social environment and problems with primary support group Axis V: 50-60 Moderate Symptoms  ADL's:  Intact  Sleep: Fair.  Appetite:  Fair  Suicidal Ideation:  Denies Homicidal Ideation:  Denies  Psychiatric Specialty Exam: Review of Systems  Constitutional: Negative.   HENT: Negative.   Eyes: Negative.   Respiratory: Negative.   Cardiovascular: Negative.   Gastrointestinal: Negative.   Genitourinary: Negative.   Musculoskeletal: Negative.   Skin: Negative.   Neurological: Negative.   Endo/Heme/Allergies: Negative.   Psychiatric/Behavioral: Positive for depression. The patient is nervous/anxious.     Blood pressure 157/112, pulse 77, temperature 97.7 F (36.5 C), temperature source Oral, resp. rate 20, height 5\' 9"  (1.753 m), weight 123.378 kg (272 lb), SpO2 98.00%.Body mass index is 40.15 kg/(m^2).  General Appearance: Casual  Eye Contact::  Minimal   Speech:  Normal Rate  Volume:  Normal  Mood: euthymic  Affect:  Congruent  Thought Process:  Coherent  Orientation:  Full (Time, Place, and Person)  Thought Content:  Delusions  Suicidal Thoughts:  No  Homicidal Thoughts:  No  Memory:  Immediate;   Fair Recent;   Fair Remote;   Fair  Judgement:  Impaired  Insight:  Lacking  Psychomotor Activity:  Normal  Concentration:  Fair  Recall:  Fair  Akathisia:  No  Handed:  Right  AIMS (if indicated):     Assets:  Resilience  Sleep:  Number of Hours: 5.75   Current Medications: Current Facility-Administered Medications  Medication Dose Route Frequency Provider Last Rate Last Dose  . acetaminophen (TYLENOL) tablet 650 mg  650 mg Oral Q6H PRN Kerry Hough, PA-C   650 mg at 05/22/13 2320  . alum & mag hydroxide-simeth (MAALOX/MYLANTA) 200-200-20 MG/5ML suspension 30 mL  30 mL Oral Q4H PRN Kerry Hough, PA-C      . aspirin chewable tablet 162 mg  162 mg Oral 3 times weekly Kerry Hough, PA-C   162 mg at 05/23/13 0754  . benztropine (COGENTIN) tablet 1 mg  1 mg Oral QHS Telly Broberg      . cloNIDine (CATAPRES) tablet 0.1 mg  0.1 mg Oral TID Kerry Hough, PA-C   0.1 mg at 05/23/13 0755  . fluPHENAZine (PROLIXIN) tablet 10 mg  10 mg Oral QHS Tahj Lindseth      . hydrochlorothiazide (HYDRODIURIL) tablet 25 mg  25 mg Oral Daily Nanine Means, NP   25 mg at 05/23/13 0754  . hydrOXYzine (ATARAX/VISTARIL) tablet 25 mg  25 mg Oral TID Nanine Means, NP   25 mg at 05/23/13 0754  . hydrOXYzine (ATARAX/VISTARIL) tablet 50 mg  50 mg Oral QHS,MR X 1 Court Joy, PA-C   50 mg at 05/22/13 2320  . lisinopril (PRINIVIL,ZESTRIL) tablet 40 mg  40 mg Oral Daily Fransisca Kaufmann, NP   40 mg at 05/23/13 0755  . magnesium hydroxide (MILK OF MAGNESIA) suspension 30 mL  30 mL Oral Daily PRN Kerry Hough, PA-C      . potassium chloride (K-DUR,KLOR-CON) CR tablet 10 mEq  10 mEq Oral Daily Nanine Means, NP   10 mEq at 05/23/13 1610    Lab Results:  No results found for this or any previous visit (from the past 48 hour(s)).  Physical Findings: AIMS: Facial and Oral Movements Muscles of Facial Expression: None, normal Lips and Perioral Area: None, normal Jaw: None, normal Tongue: None, normal,Extremity Movements Upper (arms, wrists, hands, fingers): None, normal Lower (legs, knees, ankles, toes): None, normal, Trunk Movements Neck, shoulders, hips: None, normal, Overall Severity Severity of abnormal movements (highest score from questions above): None, normal Incapacitation due to abnormal movements: None, normal Patient's awareness of abnormal movements (rate only patient's report): No Awareness, Dental Status Current problems with teeth and/or dentures?: No Does patient usually wear dentures?: No  CIWA:    COWS:     Treatment Plan Summary: Daily contact with patient to assess and evaluate symptoms and progress in treatment Medication management  Plan:  Supportive approach/coping skills/relapse prevention. Encouraged out of room, participation in group sessions and application of coping skills when distressed. Will continue to monitor response to/adverse effects of medications in use to assure effectiveness. Continue to monitor mood, behavior and interaction with staff and other patients. Continue current plan of care. Continue Prolixin 10 mg daily for paranoid delusions and Cogentin 1 mg at hs for EPS. Will encourage patient to be compliant with medications. Continue Vistaril 25 mg po TID to address symptoms of anxiety related to paranoia.  Continue  Lisinopril 40 mg daily for elevated BP.   Medical Decision Making Problem Points:  Established problem, stable/improving (1) and Review of psycho-social stressors (1) Data Points:  Review of new medications or change in dosage (2)  I certify that inpatient services furnished can reasonably be expected to improve the patient's condition.   Thedore Mins, MD 05/23/2013, 10:38  AM

## 2013-05-23 NOTE — [facility] Group Notes (Signed)
[facility] [employer] Group Therapy  05/23/2013 , 3:18 PM    Type of Therapy:  Mental Health Association Presentation  Participation Level:  Pt came in and out several time during group.  Did not stay for any significant amount of time.  No interaction with leader.    Daryel Gerald B 05/23/2013 , 3:18 PM

## 2013-05-23 NOTE — Progress Notes (Signed)
The focus of this group is to help patients review their daily goal of treatment and discuss progress on daily workbooks. Pt attended the evening group session and responded to all discussion prompts from the Writer. Pt shared that he had "three great conversations with three different people on staff" today and that he was grateful for the treatment he was receiving here. Pt's affect was appropriate and Pt volunteered several positive comments to his peers when they spoke.

## 2013-05-23 NOTE — Progress Notes (Signed)
Seen and agreed. Arneshia Ade, MD 

## 2013-05-24 LAB — BASIC METABOLIC PANEL
BUN: 16 mg/dL (ref 6–23)
Creatinine, Ser: 1.17 mg/dL (ref 0.50–1.35)
GFR calc Af Amer: 86 mL/min — ABNORMAL LOW (ref >90)
GFR calc non Af Amer: 74 mL/min — ABNORMAL LOW (ref >90)
Glucose, Bld: 95 mg/dL (ref 70–99)

## 2013-05-24 MED ORDER — METOPROLOL TARTRATE 50 MG PO TABS
50.0000 mg | ORAL_TABLET | Freq: Two times a day (BID) | ORAL | Status: DC
  Administered 2013-05-24 – 2013-05-25 (×2): 50 mg via ORAL
  Filled 2013-05-24 (×2): qty 1
  Filled 2013-05-24: qty 28
  Filled 2013-05-24 (×2): qty 1
  Filled 2013-05-24: qty 28

## 2013-05-24 NOTE — Progress Notes (Addendum)
Patient ID: Timothy Jackson, male   DOB: 05/03/1971, 42 y.o.   MRN: [id number]  [facility] MD Progress Note  05/24/2013 2:21 PM Timothy Jackson  MRN:  [id number]  Subjective:"Everything will be ok when I leave. I really can't comment further because people are not confidential around here. I'm looking forward to talking with an MD after discharge. I like more one on one stuff. I just don't feel comfortable telling you details. I think I need to keep that private because things I've said here my family managed to find out about them.   Objective: Patient remains very paranoid and guarded. He remains delusional that his family is part of a conspiracy against him. Patient reports that his anxiety is better and that he has never received treatment for that problem before.   Diagnosis:   DSM5: Axis I: Delusional disorder, NOS Axis II: Deferred Axis III:  Past Medical History  Diagnosis Date  . Hypertension   . AVM (arteriovenous malformation) spine     MRI on 04/20/2000  . Deaf     Right ear  . History of cardiac cath 02/24/2010     LVH, EF of 50-55%, LAD has 10-15% proximal stenosis, by Dr. Sharyn Lull  . Erectile dysfunction    Axis IV: other psychosocial or environmental problems, problems related to social environment and problems with primary support group Axis V: 50-60 Moderate Symptoms  ADL's:  Intact  Sleep: Fair.  Appetite:  Fair  Suicidal Ideation:  Denies Homicidal Ideation:  Denies  Psychiatric Specialty Exam: Review of Systems  Constitutional: Negative.   HENT: Negative.   Eyes: Negative.   Respiratory: Negative.   Cardiovascular: Negative.   Gastrointestinal: Negative.   Genitourinary: Negative.   Musculoskeletal: Negative.   Skin: Negative.   Neurological: Negative.   Endo/Heme/Allergies: Negative.   Psychiatric/Behavioral: Positive for depression. Negative for suicidal ideas, hallucinations, memory loss and substance abuse. The patient is nervous/anxious. The patient  does not have insomnia.     Blood pressure 209/138, pulse 74, temperature 97 F (36.1 C), temperature source Oral, resp. rate 18, height 5\' 9"  (1.753 m), weight 123.378 kg (272 lb), SpO2 98.00%.Body mass index is 40.15 kg/(m^2).  General Appearance: Casual  Eye Contact::  Minimal  Speech:  Normal Rate  Volume:  Normal  Mood: euthymic  Affect:  Congruent  Thought Process:  Coherent  Orientation:  Full (Time, Place, and Person)  Thought Content:  Delusions  Suicidal Thoughts:  No  Homicidal Thoughts:  No  Memory:  Immediate;   Fair Recent;   Fair Remote;   Fair  Judgement:  Impaired  Insight:  Lacking  Psychomotor Activity:  Normal  Concentration:  Fair  Recall:  Fair  Akathisia:  No  Handed:  Right  AIMS (if indicated):     Assets:  Resilience  Sleep:  Number of Hours: 5.75   Current Medications: Current Facility-Administered Medications  Medication Dose Route Frequency Provider Last Rate Last Dose  . acetaminophen (TYLENOL) tablet 650 mg  650 mg Oral Q6H PRN Kerry Hough, PA-C   650 mg at 05/24/13 7829  . alum & mag hydroxide-simeth (MAALOX/MYLANTA) 200-200-20 MG/5ML suspension 30 mL  30 mL Oral Q4H PRN Kerry Hough, PA-C      . aspirin chewable tablet 162 mg  162 mg Oral 3 times weekly Kerry Hough, PA-C   162 mg at 05/23/13 0754  . benztropine (COGENTIN) tablet 1 mg  1 mg Oral QHS Mojeed Akintayo      .  cloNIDine (CATAPRES) tablet 0.1 mg  0.1 mg Oral Q8H PRN Mojeed Akintayo   0.1 mg at 05/24/13 0952  . fluPHENAZine (PROLIXIN) tablet 10 mg  10 mg Oral QHS Mojeed Akintayo      . hydrochlorothiazide (HYDRODIURIL) tablet 25 mg  25 mg Oral Daily Nanine Means, NP   25 mg at 05/24/13 0731  . hydrOXYzine (ATARAX/VISTARIL) tablet 25 mg  25 mg Oral TID Nanine Means, NP   25 mg at 05/24/13 1151  . hydrOXYzine (ATARAX/VISTARIL) tablet 50 mg  50 mg Oral QHS,MR X 1 Court Joy, PA-C   50 mg at 05/23/13 2243  . lisinopril (PRINIVIL,ZESTRIL) tablet 40 mg  40 mg Oral Daily  Fransisca Kaufmann, NP   40 mg at 05/24/13 0731  . magnesium hydroxide (MILK OF MAGNESIA) suspension 30 mL  30 mL Oral Daily PRN Kerry Hough, PA-C      . metoprolol (LOPRESSOR) tablet 50 mg  50 mg Oral BID Mojeed Akintayo      . potassium chloride (K-DUR,KLOR-CON) CR tablet 10 mEq  10 mEq Oral Daily Nanine Means, NP   10 mEq at 05/24/13 2952    Lab Results:  Results for orders placed during the hospital encounter of 05/16/13 (from the past 48 hour(s))  BASIC METABOLIC PANEL     Status: Abnormal   Collection Time    05/24/13  6:15 AM      Result Value Range   Sodium 137  135 - 145 mEq/L   Potassium 4.1  3.5 - 5.1 mEq/L   Chloride 98  96 - 112 mEq/L   CO2 31  19 - 32 mEq/L   Glucose, Bld 95  70 - 99 mg/dL   BUN 16  6 - 23 mg/dL   Creatinine, Ser 8.41  0.50 - 1.35 mg/dL   Calcium 9.7  8.4 - 32.4 mg/dL   GFR calc non Af Amer 74 (*) >90 mL/min   GFR calc Af Amer 86 (*) >90 mL/min   Comment: (NOTE)     The eGFR has been calculated using the CKD EPI equation.     This calculation has not been validated in all clinical situations.     eGFR's persistently <90 mL/min signify possible Chronic Kidney     Disease.     Performed at [facility]    Physical Findings: AIMS: Facial and Oral Movements Muscles of Facial Expression: None, normal Lips and Perioral Area: None, normal Jaw: None, normal Tongue: None, normal,Extremity Movements Upper (arms, wrists, hands, fingers): None, normal Lower (legs, knees, ankles, toes): None, normal, Trunk Movements Neck, shoulders, hips: None, normal, Overall Severity Severity of abnormal movements (highest score from questions above): None, normal Incapacitation due to abnormal movements: None, normal Patient's awareness of abnormal movements (rate only patient's report): No Awareness, Dental Status Current problems with teeth and/or dentures?: No Does patient usually wear dentures?: No  CIWA:    COWS:     Treatment Plan  Summary: Daily contact with patient to assess and evaluate symptoms and progress in treatment Medication management  Plan:  Supportive approach/coping skills/relapse prevention. Encouraged out of room, participation in group sessions and application of coping skills when distressed. Will continue to monitor response to/adverse effects of medications in use to assure effectiveness. Continue to monitor mood, behavior and interaction with staff and other patients. Continue current plan of care. Continue Prolixin 10 mg daily for paranoid delusions and Cogentin 1 mg at hs for EPS. Will encourage patient to be compliant  with medications. Continue Vistaril 25 mg po TID to address symptoms of anxiety related to paranoia.  Continue  Lisinopril 40 mg daily for elevated BP. Increase Lopressor to 50 mg BID for continued elevated blood pressures.   Medical Decision Making Problem Points:  Established problem, stable/improving (1) and Review of psycho-social stressors (1) Data Points:  Review of new medications or change in dosage (2)  I certify that inpatient services furnished can reasonably be expected to improve the patient's condition.   Fransisca Kaufmann, NP-C 05/24/2013, 2:21 PM

## 2013-05-24 NOTE — Progress Notes (Signed)
Patient ID: Timothy Jackson, male   DOB: 07-31-1971, 42 y.o.   MRN: [id number] D: Patient continues to be pleasant and cooperative.  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  He denies any SI/HI/AVH.  Patient's blood pressure continues to remain high in the day with some lowering during the day.  Patient stated that he did not take blood pressure medication before admission.  Advised patient how important it was for him to have a follow up after discharge. A: Continue to monitor MD orders and medication management.  Safety checks completed every 15 minutes per protocol.R:  Patient's behavior has been appropriate.

## 2013-05-24 NOTE — Progress Notes (Signed)
Patient ID: Timothy Jackson, male   DOB: April 24, 1971, 42 y.o.   MRN: [id number]  D: Pt denies SI/HI/AVH. Pt is pleasant and cooperative. Pt states he does not know where he is going, but he seems concerned about his Bp. Pt states he will try to get a doctor to help control his Bp. Pt was only focused on his blood pressure tonight.  A: Pt was offered support and encouragement. Pt was given scheduled medications. Pt was encourage to attend groups. Q 15 minute checks were done for safety.   R:Pt attends groups and interacts well with peers and staff. Pt is taking medication. Pt has no complaints at this time.Pt receptive to treatment and safety maintained on unit.

## 2013-05-24 NOTE — Tx Team (Signed)
  Interdisciplinary Treatment Plan Update   Date Reviewed:  05/24/2013  Time Reviewed:  5:47 PM  Progress in Treatment:   Attending groups: Yes Participating in groups: Yes Taking medication as prescribed: Yes  Tolerating medication: Yes Family/Significant other contact made: Yes  Patient understands diagnosis: Yes  Discussing patient identified problems/goals with staff: Yes Medical problems stabilized or resolved: Yes Denies suicidal/homicidal ideation: Yes Patient has not harmed self or others: Yes  For review of initial/current patient goals, please see plan of care.  Estimated Length of Stay:  D/C tomorrow  Reason for Continuation of Hospitalization:   New Problems/Goals identified:  N/A  Discharge Plan or Barriers:   stay with a friend, follow up outpt  Additional Comments:  Attendees:  Signature: Thedore Mins, MD 05/24/2013 5:47 PM   Signature: Richelle Ito, LCSW 05/24/2013 5:47 PM  Signature: Fransisca Kaufmann, NP 05/24/2013 5:47 PM  Signature: Joslyn Devon, RN 05/24/2013 5:47 PM  Signature:  05/24/2013 5:47 PM  Signature:  05/24/2013 5:47 PM  Signature:   05/24/2013 5:47 PM  Signature:    Signature:    Signature:    Signature:    Signature:    Signature:      Scribe for Treatment Team:   Richelle Ito, LCSW  05/24/2013 5:47 PM

## 2013-05-24 NOTE — Progress Notes (Signed)
[facility] Adult Case Management Discharge Plan :  Will you be returning to the same living situation after discharge: No. At discharge, do you have transportation home?:Yes,  friend or bus pass Do you have the ability to pay for your medications:Yes,  mental health  Release of information consent forms completed and in the chart;  Patient's signature needed at discharge.  Patient to Follow up at: Follow-up Information   Follow up with [facility]. (Go to the walk-in clinic M-F between 8 and 9AM for your hospital follow up appointment)    Contact information:   [address]  [place]  [phone]      Patient denies SI/HI:   Yes,  yes    Safety Planning and Suicide Prevention discussed:  Yes,  yes  Ida Rogue 05/24/2013, 5:44 PM

## 2013-05-24 NOTE — [facility] Group Notes (Signed)
[facility] Group Notes:  (Counselor/Nursing/MHT/Case Management/Adjunct)  05/24/2013 1:15PM  Type of Therapy:  Group Therapy  Participation Level:  Active  Participation Quality:  Appropriate  Affect:  Flat  Cognitive:  Oriented  Insight:  Improving  Engagement in Group:  Limited  Engagement in Therapy:  Limited  Modes of Intervention:  Discussion, Exploration and Socialization  Summary of Progress/Problems: The topic for group was balance in life.  Pt participated in the discussion about when their life was in balance and out of balance and how this feels.  Pt discussed ways to get back in balance and short term goals they can work on to get where they want to be. Libero was an excellent co-leader today.  Not only did he talk about insightful things such as "finding the positive in every situation" and "embracing the love," he also gave positive feedback to others, particularly one patient who has been very guarded during her stay with [place].  He related his experience to hers, and his genuine care came through during his conversation with her.   Ida Rogue 05/24/2013 3:18 PM

## 2013-05-25 ENCOUNTER — Emergency Department (HOSPITAL_COMMUNITY)
Admission: EM | Admit: 2013-05-25 | Discharge: 2013-05-25 | Discharge status: Against Medical Advice | Payer: Self-pay | Attending: Emergency Medicine | Admitting: Emergency Medicine

## 2013-05-25 ENCOUNTER — Encounter (HOSPITAL_COMMUNITY): Payer: Self-pay | Admitting: *Deleted

## 2013-05-25 DIAGNOSIS — F22 Delusional disorders: Secondary | ICD-10-CM | POA: Insufficient documentation

## 2013-05-25 DIAGNOSIS — Z87448 Personal history of other diseases of urinary system: Secondary | ICD-10-CM | POA: Insufficient documentation

## 2013-05-25 DIAGNOSIS — Z7982 Long term (current) use of aspirin: Secondary | ICD-10-CM | POA: Insufficient documentation

## 2013-05-25 DIAGNOSIS — F200 Paranoid schizophrenia: Secondary | ICD-10-CM | POA: Insufficient documentation

## 2013-05-25 DIAGNOSIS — R209 Unspecified disturbances of skin sensation: Secondary | ICD-10-CM | POA: Insufficient documentation

## 2013-05-25 DIAGNOSIS — I10 Essential (primary) hypertension: Secondary | ICD-10-CM | POA: Insufficient documentation

## 2013-05-25 DIAGNOSIS — Z9889 Other specified postprocedural states: Secondary | ICD-10-CM | POA: Insufficient documentation

## 2013-05-25 DIAGNOSIS — Z79899 Other long term (current) drug therapy: Secondary | ICD-10-CM | POA: Insufficient documentation

## 2013-05-25 DIAGNOSIS — R51 Headache: Secondary | ICD-10-CM | POA: Insufficient documentation

## 2013-05-25 DIAGNOSIS — F17200 Nicotine dependence, unspecified, uncomplicated: Secondary | ICD-10-CM | POA: Insufficient documentation

## 2013-05-25 DIAGNOSIS — Z87768 Personal history of other specified (corrected) congenital malformations of integument, limbs and musculoskeletal system: Secondary | ICD-10-CM | POA: Insufficient documentation

## 2013-05-25 DIAGNOSIS — Z8776 Personal history of (corrected) congenital malformations of integument, limbs and musculoskeletal system: Secondary | ICD-10-CM | POA: Insufficient documentation

## 2013-05-25 DIAGNOSIS — F411 Generalized anxiety disorder: Secondary | ICD-10-CM | POA: Insufficient documentation

## 2013-05-25 DIAGNOSIS — H9190 Unspecified hearing loss, unspecified ear: Secondary | ICD-10-CM | POA: Insufficient documentation

## 2013-05-25 MED ORDER — METOPROLOL TARTRATE 50 MG PO TABS: 50.0000 mg | ORAL_TABLET | Freq: Two times a day (BID) | ORAL | Status: DC

## 2013-05-25 MED ORDER — POTASSIUM CHLORIDE CRYS ER 10 MEQ PO TBCR: 10.0000 meq | EXTENDED_RELEASE_TABLET | Freq: Every day | ORAL | Status: DC

## 2013-05-25 MED ORDER — ISOSORB DINITRATE-HYDRALAZINE 20-37.5 MG PO TABS
0.5000 | ORAL_TABLET | Freq: Two times a day (BID) | ORAL | Status: DC
  Administered 2013-05-25: 0.5 via ORAL
  Filled 2013-05-25: qty 0.5
  Filled 2013-05-25: qty 7
  Filled 2013-05-25: qty 0.5
  Filled 2013-05-25: qty 7
  Filled 2013-05-25: qty 0.5

## 2013-05-25 MED ORDER — HYDROXYZINE HCL 50 MG PO TABS: 50.0000 mg | ORAL_TABLET | Freq: Every evening | ORAL | Status: DC | PRN

## 2013-05-25 MED ORDER — BENZTROPINE MESYLATE 1 MG PO TABS: 1.0000 mg | ORAL_TABLET | Freq: Every day | ORAL | Status: DC

## 2013-05-25 MED ORDER — ACETAMINOPHEN 325 MG PO TABS
975.0000 mg | ORAL_TABLET | Freq: Once | ORAL | Status: AC
  Administered 2013-05-25: 975 mg via ORAL
  Filled 2013-05-25: qty 3

## 2013-05-25 MED ORDER — ASPIRIN 81 MG PO CHEW: 162.0000 mg | CHEWABLE_TABLET | ORAL | Status: DC

## 2013-05-25 MED ORDER — LISINOPRIL 40 MG PO TABS: 40.0000 mg | ORAL_TABLET | Freq: Every day | ORAL | Status: DC

## 2013-05-25 MED ORDER — FLUPHENAZINE HCL 10 MG PO TABS: 10.0000 mg | ORAL_TABLET | Freq: Every day | ORAL | Status: DC

## 2013-05-25 MED ORDER — HYDROXYZINE HCL 25 MG PO TABS: 25.0000 mg | ORAL_TABLET | Freq: Three times a day (TID) | ORAL | Status: DC

## 2013-05-25 MED ORDER — HYDROCHLOROTHIAZIDE 25 MG PO TABS
25.0000 mg | ORAL_TABLET | Freq: Every day | ORAL | Status: DC
  Filled 2013-05-25: qty 14

## 2013-05-25 MED ORDER — HYDROCHLOROTHIAZIDE 25 MG PO TABS: 25.0000 mg | ORAL_TABLET | Freq: Every day | ORAL | Status: DC

## 2013-05-25 MED ORDER — CLONIDINE HCL 0.2 MG PO TABS
0.2000 mg | ORAL_TABLET | Freq: Once | ORAL | Status: AC
  Administered 2013-05-25: 0.2 mg via ORAL

## 2013-05-25 MED ORDER — ISOSORB DINITRATE-HYDRALAZINE 20-37.5 MG PO TABS: 0.5000 | ORAL_TABLET | Freq: Two times a day (BID) | ORAL | Status: DC

## 2013-05-25 NOTE — ED Notes (Signed)
Bed: [facility] Expected date:  Expected time:  Means of arrival:  Comments: [employer]

## 2013-05-25 NOTE — Progress Notes (Signed)
Contacted by Fransisca Kaufmann NP at [facility] for consultation on antihypertensive agents to be prescribed at discharge for long term treatment on Mr. Timothy Jackson; BMET demonstrated normal electrolytes and renal function. Patient recently started on lisinopril, HCTZ and metoprolol (approx 3 days of treatment); BP fluctuating but overall improving in control. Discuss about needs of at least 3 weeks before seen full effect of antihypertensive agents and the need for close follow up to monitor renal function and electrolytes. In order to prevent any hypertensive crisis since BP is still uncontrolled, will use bidil BID (0.5 tablet) and the patient will have appointment in 10 days for BP reassessment and medication adjustment. There was no complaints suggesting end organ damages due to elevated BP at this time. NP appreciated recommendations and will follow instructions.  VS: 98.2/73/18/ 158 over 98  Kevin Space [phone]

## 2013-05-25 NOTE — Discharge Summary (Signed)
Physician Discharge Summary Note  Patient:  Timothy Jackson is an 42 y.o., male MRN:  [id number] DOB:  [birth year] Patient phone:  [phone] (home)  Patient address:   [address] [place] [address]   Date of Admission:  05/16/2013 Date of Discharge: 05/25/2013  Discharge Diagnoses: Principal Problem:   Delusional disorder(297.1) Active Problems:   Generalized anxiety disorder   Cannabis abuse  Axis Diagnosis:  AXIS I: Generalized Anxiety Disorder . Delusional disorder, NOS. Cannabis use disorder  AXIS II: Deferred  AXIS III:  Past Medical History   Diagnosis  Date   .  Hypertension    .  AVM (arteriovenous malformation) spine      MRI on 04/20/2000   .  Deaf      Right ear   .  History of cardiac cath  02/24/2010     LVH, EF of 50-55%, LAD has 10-15% proximal stenosis, by Dr. Sharyn Lull   .  Erectile dysfunction    AXIS IV: economic problems, other psychosocial or environmental problems and problems related to social environment  AXIS V: 61-70 mild symptoms  Level of Care:  OP  Hospital Course:   Timothy Jackson is a 42 year old single male who presented to [facility] at the urging of his family for treatment of physical complaints and persecutory delusions. The patient presents as very paranoid during the initial assessment by writer stating "Is it confidential here. I need to tell my story to get it off my chest but I don't want to tell it to the wrong person. I'm here to discredit my story. 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 I was getting all kind of messages on facebook. But then people started blocking everything I did and following me. I could not figure out who was doing it. Now I think I have. I had no idea that my family was against me. They are part of the conspiracy. They have all worked in the [employer]. They have been calling to find out about me and I can't believe the staff has been talking to me. I think they  want to stop me from revealing special family secrets. My grandfather was involved in the conspiracy about Ignacia Bayley and was killed shortly after him." The patient is very tangential and wants to "sit down and tell my whole story for hours to get it off my chest like my Uncle suggested." He is very suspicious and seems to doubt that Dr. Jannifer Franklin is in fact a qualified MD. The patient is worried about who writer "will tell this information to" and offers on several occasions to get the movie he made out of his bookbag. 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 The patient required frequent redirection to complete basic questions related to the assessment.   While a patient in this hospital, Timothy Jackson was enrolled in group counseling and activities as well as received the following medication No current facility-administered medications for this encounter. Current outpatient prescriptions:aspirin 81 MG chewable tablet, Chew 162 mg by mouth daily., Disp: , Rfl: ;  benztropine (COGENTIN) 1 MG tablet, Take 1 mg by mouth at bedtime., Disp: , Rfl: ;  fluPHENAZine (PROLIXIN) 10 MG tablet, Take 1 tablet (10 mg total) by mouth at bedtime., Disp: 30 tablet, Rfl: 0;  hydrochlorothiazide (HYDRODIURIL) 25 MG tablet, Take 1 tablet (25 mg total) by mouth daily., Disp: 30 tablet, Rfl: 0 hydrOXYzine (ATARAX/VISTARIL) 25 MG tablet, Take 1 tablet (25 mg total) by mouth 3 (three) times daily., Disp: 90 tablet, Rfl: 0;  hydrOXYzine (ATARAX/VISTARIL) 50 MG tablet, Take 25 mg by mouth every 8 (eight) hours as needed for itching., Disp: , Rfl: ;  isosorbide-hydrALAZINE (BIDIL) 20-37.5 MG per tablet, Take 0.5 tablets by mouth 2 (two) times daily., Disp: 60 tablet, Rfl: 0 lisinopril  (PRINIVIL,ZESTRIL) 40 MG tablet, Take 1 tablet (40 mg total) by mouth daily., Disp: 30 tablet, Rfl: 0;  metoprolol (LOPRESSOR) 50 MG tablet, Take 1 tablet (50 mg total) by mouth 2 (two) times daily., Disp: 60 tablet, Rfl: 0;  potassium chloride (K-DUR,KLOR-CON) 10 MEQ tablet, Take 1 tablet (10 mEq total) by mouth daily., Disp: 30 tablet, Rfl: 0 The patient was not on any medications for his mental health prior to admission. The patient was started on Vistaril 25 mg TID for anxiety and Prolixin at hs for symptoms of paranoia. His Prolixin was increased to 10 mg at hs to target symptoms of paranoia. Patient was started on blood pressure due to severe elevations. He was started on Lisinopril. HCTZ, and lopressor. A medical consult was obtained and another agent was added. The patient reported that his anxiety was lower but patient still expressed delusions about private information being shared with his peers and feeling that people are out to get him. Patient reported a decrease in his symptoms and was found stable enough for d/c. He was given prescriptions for his medications and sample medicine. An appointment was made for the patient to follow up with Primary Care for his blood pressure. Patient attended treatment team meeting this am and met with treatment team members. Pt symptoms, treatment plan and response to treatment discussed. Timothy Jackson endorsed that their symptoms have improved. Pt also stated that they are stable for discharge.  In other to control Principal Problem:   Delusional disorder(297.1) Active Problems:   Generalized anxiety disorder   Cannabis abuse , they will continue psychiatric care on outpatient basis. They will follow-up at  Follow-up Information   Follow up with [facility]. (Go to the walk-in clinic M-F between 8 and 9AM for your hospital follow up appointment)    Contact information:   [address]  [place]  [336] [phone]      Follow up with [facility] On 06/06/2013. (Wed at 2:00)    Contact information:   [address]  [[phone]    .  In addition they were instructed to take all your medications as prescribed by your mental healthcare provider, to report any adverse effects and or reactions from your medicines to your outpatient provider promptly, patient is instructed and cautioned to not engage in alcohol and or illegal drug use while on prescription medicines, in the event of worsening symptoms, patient is instructed to call the crisis hotline, 911 and or go to the nearest ED for appropriate evaluation and treatment of symptoms.   Upon discharge, patient adamantly denies suicidal, homicidal ideations, auditory, visual hallucinations and or delusional thinking. They left [facility] with all personal belongings in no apparent distress.  Consults:  See electronic record for details  Significant Diagnostic Studies:  See electronic record for details  Discharge Vitals:   Blood pressure 150/98, pulse 73, temperature 98.2 F (36.8 C), temperature source Oral, resp. rate 18, height 5\' 9"  (1.753 m), weight 123.378 kg (272 lb), SpO2 98.00%..  Mental Status Exam: See Mental Status Examination and Suicide Risk Assessment completed by Attending Physician prior to discharge.  Discharge destination:  Home  Is patient on multiple antipsychotic therapies at discharge:  No  Has Patient had three or more failed trials of antipsychotic monotherapy by history: N/A Recommended Plan for Multiple Antipsychotic Therapies: N/A     Discharge Orders   Future Appointments Provider Department Dept Phone   06/06/2013 2:00 PM Gaylord Shih, MD [facility] [phone]   Future Orders Complete By Expires   Discharge instructions  As directed    Comments:     Please make sure to see a Primary Care MD in ten days for follow up of severely elevated blood pressures.       Medication List    Notice   Cannot  display patient medications because the patient has not yet arrived.     Follow-up Information   Follow up with [facility]. (Go to the walk-in clinic M-F between 8 and 9AM for your hospital follow up appointment)    Contact information:   [address]  [place]  [336] [phone]      Follow up with [facility] On 06/06/2013. (Wed at 2:00)    Contact information:   [address]  [336] [phone]     Follow-up recommendations:   Activities: Resume typical activities Diet: Resume typical diet Tests: none Other: Follow up with outpatient provider and report any side effects to out patient prescriber.  Comments:  Take all your medications as prescribed by your mental healthcare provider. Report any adverse effects and or reactions from your medicines to your outpatient provider promptly. Patient is instructed and cautioned to not engage in alcohol and or illegal drug use while on prescription medicines. In the event of worsening symptoms, patient is instructed to call the crisis hotline, 911 and or go to the nearest ED for appropriate evaluation and treatment of symptoms. Follow-up with your primary care provider for your other medical issues, concerns and or health care needs.  SignedFransisca Kaufmann NP-C 05/25/2013 2:40 PM

## 2013-05-25 NOTE — Progress Notes (Signed)
Seen and agreed. Zaylah Blecha, MD 

## 2013-05-25 NOTE — Progress Notes (Signed)
Adult Psychoeducational Group Note  Date:  05/25/2013 Time:  1:52 AM  Group Topic/Focus:  Wrap-Up Group:   The focus of this group is to help patients review their daily goal of treatment and discuss progress on daily workbooks.  Participation Level:  Active  Participation Quality:  Appropriate  Affect:  Appropriate  Cognitive:  Appropriate  Insight: Appropriate  Engagement in Group:  Engaged  Modes of Intervention:  Discussion  Additional Comments:  Pt attended wrap-up group this evening. Pt described his day as being "pretty good'. Pt stated he enjoyed all of his groups today and that he learned so much today. Pt shared several positive comments with peers.   Breckyn Ticas A 05/25/2013, 1:52 AM

## 2013-05-25 NOTE — ED Notes (Signed)
Pt was just discharged from [facility], hx of HTN. States his blood pressure before leaving [facility] was 164/110, has never been this high and he is concerned. Pt is complaining of a throbbing headache and appears fatigued. States he has a lot of stress in his life right now. Was given lisinopril before discharged. BP now is 138/78
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
## 2013-05-25 NOTE — Progress Notes (Signed)
Patient ID: Timothy Jackson, male   DOB: 10-27-1970, 42 y.o.   MRN: [id number] Patient has been calm and cooperative throughout shift thus far, orders received for discharge. Patient has been interactive on the unit, attending unit programming and interactive with peers. Pt compliant with medications and denies any SI/HI/A/V Hallucinations. Pt aware of pending discharge and states '' yeah I've got a friend I can go stay with here [place] I just need a bus pass ''  Patient provided copy of AVS , discharge instructions reviewed and bus pass provided. Rx given as well as 7 day free supply of medications. All belongings returned. Follow up care plans reviewed, and crisis services reviewed. No signs of acute decompensation. Patient escorted from unit with staff.

## 2013-05-25 NOTE — [facility] Suicide Risk Assessment (Signed)
Suicide Risk Assessment  Discharge Assessment     Demographic Factors:  Male, Low socioeconomic status and Unemployed  Mental Status Per Nursing Assessment::   On Admission:     Current Mental Status by Physician: patient denies suicidal ideation, intent or plan  Loss Factors: Financial problems/change in socioeconomic status  Historical Factors: Family history of mental illness or substance abuse and Impulsivity  Risk Reduction Factors:   Sense of responsibility to family and Positive coping skills or problem solving skills  Continued Clinical Symptoms:  Alcohol/Substance Abuse/Dependencies  Cognitive Features That Contribute To Risk:  Closed-mindedness Polarized thinking    Suicide Risk:  Minimal: No identifiable suicidal ideation.  Patients presenting with no risk factors but with morbid ruminations; may be classified as minimal risk based on the severity of the depressive symptoms  Discharge Diagnoses:   AXIS I:  Generalized Anxiety Disorder . Delusional disorder, NOS. Cannabis use disorder AXIS II:  Deferred AXIS III:   Past Medical History  Diagnosis Date  . Hypertension   . AVM (arteriovenous malformation) spine     MRI on 04/20/2000  . Deaf     Right ear  . History of cardiac cath 02/24/2010     LVH, EF of 50-55%, LAD has 10-15% proximal stenosis, by Dr. Sharyn Lull  . Erectile dysfunction    AXIS IV:  economic problems, other psychosocial or environmental problems and problems related to social environment AXIS V:  61-70 mild symptoms  Plan Of Care/Follow-up recommendations:  Activity:  as tolerated  Diet:  healthy Tests:  routine Other:  patient to keep his after care appointment  Is patient on multiple antipsychotic therapies at discharge:  No   Has Patient had three or more failed trials of antipsychotic monotherapy by history:  No  Recommended Plan for Multiple Antipsychotic Therapies: N/A  Monetta Lick,MD 05/25/2013, 10:00 AM

## 2013-05-25 NOTE — Progress Notes (Signed)
Spoke with Joni Reining, [facility] after pt evaluated by her. SW consult entered after speaking with Toma Copier, SW at [phone] about pt concerns with not having a place to stay, not being able to contact his friend Left [facility] and came to [facility] ED

## 2013-05-28 NOTE — Discharge Summary (Signed)
Seen and agreed. Tranisha Tissue, MD 

## 2013-05-28 NOTE — ED Provider Notes (Signed)
Medical screening examination/treatment/procedure(s) were performed by non-physician practitioner and as supervising physician I was immediately available for consultation/collaboration.    Gilda Crease, MD 05/28/13 [phone]

## 2013-05-30 NOTE — Progress Notes (Signed)
Patient Discharge Instructions:  After Visit Summary (AVS):   Faxed to:  04/29/13 Discharge Summary Note:   Faxed to:  04/29/13 Psychiatric Admission Assessment Note:   Faxed to:  04/29/13 Suicide Risk Assessment - Discharge Assessment:   Faxed to:  04/29/13 Faxed/Sent to the Next Level Care provider:  04/29/13 Next Level Care Provider Has Access to the EMR, 04/29/13 Faxed to [facility] @ [phone] Records provided to [facility] via [facility]/Epic access.  Jerelene Redden, 05/30/2013, 3:35 PM

## 2013-06-01 ENCOUNTER — Emergency Department (HOSPITAL_COMMUNITY)
Admission: EM | Admit: 2013-06-01 | Discharge: 2013-06-04 | Disposition: A | Discharge status: Other Acute Inpatient Hospital | Payer: No Typology Code available for payment source | Attending: Emergency Medicine | Admitting: Emergency Medicine

## 2013-06-01 ENCOUNTER — Encounter (HOSPITAL_COMMUNITY): Payer: Self-pay

## 2013-06-01 DIAGNOSIS — Z87448 Personal history of other diseases of urinary system: Secondary | ICD-10-CM | POA: Insufficient documentation

## 2013-06-01 DIAGNOSIS — Z8669 Personal history of other diseases of the nervous system and sense organs: Secondary | ICD-10-CM | POA: Insufficient documentation

## 2013-06-01 DIAGNOSIS — F411 Generalized anxiety disorder: Secondary | ICD-10-CM | POA: Insufficient documentation

## 2013-06-01 DIAGNOSIS — Z87728 Personal history of other specified (corrected) congenital malformations of nervous system and sense organs: Secondary | ICD-10-CM | POA: Insufficient documentation

## 2013-06-01 DIAGNOSIS — F22 Delusional disorders: Secondary | ICD-10-CM | POA: Insufficient documentation

## 2013-06-01 DIAGNOSIS — Z7982 Long term (current) use of aspirin: Secondary | ICD-10-CM | POA: Insufficient documentation

## 2013-06-01 DIAGNOSIS — F309 Manic episode, unspecified: Secondary | ICD-10-CM | POA: Insufficient documentation

## 2013-06-01 DIAGNOSIS — Z9861 Coronary angioplasty status: Secondary | ICD-10-CM | POA: Insufficient documentation

## 2013-06-01 DIAGNOSIS — F17200 Nicotine dependence, unspecified, uncomplicated: Secondary | ICD-10-CM | POA: Insufficient documentation

## 2013-06-01 DIAGNOSIS — F29 Unspecified psychosis not due to a substance or known physiological condition: Secondary | ICD-10-CM

## 2013-06-01 DIAGNOSIS — Z79899 Other long term (current) drug therapy: Secondary | ICD-10-CM | POA: Insufficient documentation

## 2013-06-01 DIAGNOSIS — I10 Essential (primary) hypertension: Secondary | ICD-10-CM | POA: Insufficient documentation

## 2013-06-01 HISTORY — DX: Arteriovenous malformation of cerebral vessels: Q28.2

## 2013-06-01 HISTORY — DX: Delusional disorders: F22

## 2013-06-01 LAB — BASIC METABOLIC PANEL
Calcium: 9.8 mg/dL (ref 8.4–10.5)
Creatinine, Ser: 1.07 mg/dL (ref 0.50–1.35)
GFR calc Af Amer: >90 mL/min (ref >90)

## 2013-06-01 LAB — CBC WITH DIFFERENTIAL/PLATELET
Basophils Absolute: 0 10*3/uL (ref 0.0–0.1)
Lymphocytes Relative: 33 % (ref 12–46)
Neutro Abs: 2.7 10*3/uL (ref 1.7–7.7)
Platelets: 264 10*3/uL (ref 150–400)
RDW: 13.5 % (ref 11.5–15.5)
WBC: 4.8 10*3/uL (ref 4.0–10.5)

## 2013-06-01 LAB — RAPID URINE DRUG SCREEN, HOSP PERFORMED
Amphetamines: NOT DETECTED
Benzodiazepines: NOT DETECTED
Opiates: NOT DETECTED

## 2013-06-01 MED ORDER — BENZTROPINE MESYLATE 1 MG PO TABS
1.0000 mg | ORAL_TABLET | Freq: Every day | ORAL | Status: DC
  Administered 2013-06-01 – 2013-06-03 (×3): 1 mg via ORAL
  Filled 2013-06-01 (×3): qty 1

## 2013-06-01 MED ORDER — METOPROLOL TARTRATE 25 MG PO TABS
50.0000 mg | ORAL_TABLET | Freq: Two times a day (BID) | ORAL | Status: DC
  Administered 2013-06-01 – 2013-06-03 (×6): 50 mg via ORAL
  Filled 2013-06-01 (×4): qty 2
  Filled 2013-06-01 (×2): qty 1
  Filled 2013-06-01: qty 2

## 2013-06-01 MED ORDER — HYDROXYZINE HCL 25 MG PO TABS
50.0000 mg | ORAL_TABLET | Freq: Three times a day (TID) | ORAL | Status: DC | PRN
  Administered 2013-06-03: 50 mg via ORAL
  Filled 2013-06-01: qty 2

## 2013-06-01 MED ORDER — FLUPHENAZINE HCL 10 MG PO TABS
10.0000 mg | ORAL_TABLET | Freq: Every day | ORAL | Status: DC
  Administered 2013-06-01: 10 mg via ORAL
  Filled 2013-06-01 (×2): qty 1

## 2013-06-01 MED ORDER — HYDROCHLOROTHIAZIDE 25 MG PO TABS
25.0000 mg | ORAL_TABLET | Freq: Every day | ORAL | Status: DC
  Administered 2013-06-01 – 2013-06-04 (×4): 25 mg via ORAL
  Filled 2013-06-01 (×5): qty 1

## 2013-06-01 MED ORDER — BENZTROPINE MESYLATE 1 MG PO TABS
1.0000 mg | ORAL_TABLET | Freq: Once | ORAL | Status: AC
  Administered 2013-06-01: 1 mg via ORAL
  Filled 2013-06-01: qty 1

## 2013-06-01 MED ORDER — HYDROXYZINE HCL 25 MG PO TABS: 25.0000 mg | ORAL_TABLET | Freq: Three times a day (TID) | ORAL | Status: DC | PRN

## 2013-06-01 MED ORDER — HALOPERIDOL 5 MG PO TABS
10.0000 mg | ORAL_TABLET | Freq: Once | ORAL | Status: AC
  Administered 2013-06-01: 10 mg via ORAL
  Filled 2013-06-01: qty 2

## 2013-06-01 MED ORDER — ASPIRIN 81 MG PO CHEW
162.0000 mg | CHEWABLE_TABLET | Freq: Every day | ORAL | Status: DC
  Administered 2013-06-01 – 2013-06-04 (×4): 162 mg via ORAL
  Filled 2013-06-01: qty 1
  Filled 2013-06-01 (×3): qty 2
  Filled 2013-06-01: qty 1

## 2013-06-01 MED ORDER — LISINOPRIL 40 MG PO TABS
40.0000 mg | ORAL_TABLET | Freq: Every day | ORAL | Status: DC
  Administered 2013-06-01 – 2013-06-04 (×4): 40 mg via ORAL
  Filled 2013-06-01 (×5): qty 1

## 2013-06-01 MED ORDER — LORAZEPAM 1 MG PO TABS
2.0000 mg | ORAL_TABLET | Freq: Once | ORAL | Status: AC
  Administered 2013-06-01: 2 mg via ORAL
  Filled 2013-06-01: qty 2

## 2013-06-01 MED ORDER — ISOSORB DINITRATE-HYDRALAZINE 20-37.5 MG PO TABS
0.5000 | ORAL_TABLET | Freq: Two times a day (BID) | ORAL | Status: DC
  Administered 2013-06-01 – 2013-06-04 (×7): 0.5 via ORAL
  Filled 2013-06-01 (×10): qty 0.5

## 2013-06-01 NOTE — ED Notes (Signed)
Pt talking non stop, states wants to see a psych MD, pt states just left here, pt manic unable to follow commands, pt states he is AH/HI, denies SI
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
## 2013-06-01 NOTE — ED Provider Notes (Signed)
CSN: [id number]     Arrival date & time 06/01/13  1318 History   First MD Initiated Contact with Patient 06/01/13 1308     Chief Complaint  Patient presents with  . Medical Clearance    The history is provided by the patient. The history is limited by the condition of the patient (delusional disorder, psychosis, mania).  Pt was seen at 1310.  Per pt, c/o gradual onset and worsening of persistent "talking too fast" and "people are out to get me" since he was discharged from [facility] several weeks ago. Pt states he has "not been taking my schizophrenic medicines" because "they messed my mind up." States he "threw them away" after he was discharged. States he came to the ED today "because I need to speak with a Psych doctor" and "I feel anxious."  Denies hallucinations, denies SI and HI.    Past Medical History  Diagnosis Date  . Hypertension   . Deaf     Right ear  . History of cardiac cath 02/24/2010     LVH, EF of 50-55%, LAD has 10-15% proximal stenosis, by Dr. Sharyn Lull  . Anxiety, generalized   . Erectile dysfunction   . Delusional disorder   . 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 Cardiac catheterization  01/2010    "essentially negative"  . Fracture surgery Left     ORIF left arm as a child     Family History  Problem Relation Age of Onset  . Hypertension Father   . Coronary artery disease Maternal Grandmother   . Hypertension Maternal Grandmother   . Diabetes Maternal Grandmother   . Coronary artery disease Maternal Uncle   . Hypertension Maternal Uncle   . Diabetes Maternal Aunt   . Leukemia Maternal Uncle    History  Substance Use Topics  . Smoking status: Current Some Day Smoker  . Smokeless tobacco: Not on file  . Alcohol Use: 0.0 oz/week     Comment: occassionally    Review of Systems  Unable to perform ROS: Psychiatric disorder    Allergies  Review of patient's allergies indicates no known  allergies.  Home Medications   Current Outpatient Rx  Name  Route  Sig  Dispense  Refill  . aspirin 81 MG chewable tablet   Oral   Chew 162 mg by mouth daily.         . benztropine (COGENTIN) 1 MG tablet   Oral   Take 1 mg by mouth at bedtime.         . fluPHENAZine (PROLIXIN) 10 MG tablet   Oral   Take 1 tablet (10 mg total) by mouth at bedtime.   30 tablet   0   . hydrochlorothiazide (HYDRODIURIL) 25 MG tablet   Oral   Take 1 tablet (25 mg total) by mouth daily.   30 tablet   0   . hydrOXYzine (ATARAX/VISTARIL) 50 MG tablet   Oral   Take 25 mg by mouth every 8 (eight) hours as needed for itching.         . isosorbide-hydrALAZINE (BIDIL) 20-37.5 MG per tablet   Oral   Take 0.5 tablets by mouth 2 (two) times daily.   60 tablet   0   . lisinopril (PRINIVIL,ZESTRIL) 40 MG tablet   Oral   Take 1 tablet (40 mg total) by mouth daily.   30 tablet   0   . metoprolol (LOPRESSOR) 50 MG  tablet   Oral   Take 1 tablet (50 mg total) by mouth 2 (two) times daily.   60 tablet   0   . potassium chloride (K-DUR,KLOR-CON) 10 MEQ tablet   Oral   Take 1 tablet (10 mEq total) by mouth daily.   30 tablet   0   . hydrALAZINE (APRESOLINE) 50 MG tablet   Oral   Take 50 mg by mouth 3 (three) times daily.          BP 184/116  Pulse 95  Temp(Src) 98.9 F (37.2 C) (Oral)  Resp 20  SpO2 98% Physical Exam 1315: Physical examination:  Nursing notes reviewed; Vital signs and O2 SAT reviewed;  Constitutional: Well developed, Well nourished, Well hydrated, In no acute distress; Head:  Normocephalic, atraumatic; Eyes: EOMI, PERRL, No scleral icterus; ENMT: Mouth and pharynx normal, Mucous membranes moist; Neck: Supple, Full range of motion, No lymphadenopathy; Cardiovascular: Regular rate and rhythm, No gallop; Respiratory: Breath sounds clear & equal bilaterally, No wheezes.  Speaking full sentences with ease, Normal respiratory effort/excursion; Chest: Nontender, Movement  normal; Abdomen: Soft, Nontender, Nondistended, Normal bowel sounds; Genitourinary: No CVA tenderness; Extremities: Pulses normal, No tenderness, No edema, No calf edema or asymmetry.; Neuro: Awake, alert.  No facial droop. Speech clear. Climbs on and off chair by himself easily. Gait steady. No gross focal motor deficits in extremities.; Skin: Color normal, Warm, Dry.; Psych:  Delusional, loud rapid pressured speech, tangential.    ED Course  Procedures   MDM  MDM Reviewed: previous chart, nursing note and vitals Interpretation: labs   Results for orders placed during the hospital encounter of 06/01/13  BASIC METABOLIC PANEL      Result Value Range   Sodium 134 (*) 135 - 145 mEq/L   Potassium 3.8  3.5 - 5.1 mEq/L   Chloride 100  96 - 112 mEq/L   CO2 26  19 - 32 mEq/L   Glucose, Bld 127 (*) 70 - 99 mg/dL   BUN 14  6 - 23 mg/dL   Creatinine, Ser 4.78  0.50 - 1.35 mg/dL   Calcium 9.8  8.4 - 29.5 mg/dL   GFR calc non Af Amer 83 (*) >90 mL/min   GFR calc Af Amer >90  >90 mL/min  CBC WITH DIFFERENTIAL      Result Value Range   WBC 4.8  4.0 - 10.5 K/uL   RBC 5.22  4.22 - 5.81 MIL/uL   Hemoglobin 14.2  13.0 - 17.0 g/dL   HCT 62.1  30.8 - 65.7 %   MCV 80.3  78.0 - 100.0 fL   MCH 27.2  26.0 - 34.0 pg   MCHC 33.9  30.0 - 36.0 g/dL   RDW 84.6  96.2 - 95.2 %   Platelets 264  150 - 400 K/uL   Neutrophils Relative % 57  43 - 77 %   Neutro Abs 2.7  1.7 - 7.7 K/uL   Lymphocytes Relative 33  12 - 46 %   Lymphs Abs 1.6  0.7 - 4.0 K/uL   Monocytes Relative 8  3 - 12 %   Monocytes Absolute 0.4  0.1 - 1.0 K/uL   Eosinophils Relative 2  0 - 5 %   Eosinophils Absolute 0.1  0.0 - 0.7 K/uL   Basophils Relative 0  0 - 1 %   Basophils Absolute 0.0  0.0 - 0.1 K/uL  URINE RAPID DRUG SCREEN (HOSP PERFORMED)      Result Value Range  Opiates NONE DETECTED  NONE DETECTED   Cocaine NONE DETECTED  NONE DETECTED   Benzodiazepines NONE DETECTED  NONE DETECTED   Amphetamines NONE DETECTED  NONE  DETECTED   Tetrahydrocannabinol POSITIVE (*) NONE DETECTED   Barbiturates NONE DETECTED  NONE DETECTED  ETHANOL      Result Value Range   Alcohol, Ethyl (B) <11  0 - 11 mg/dL    4098:  Pt stated he would take a PO dose of ativan "for my anxiety."  Pt will need Psych to evaluate. He remains voluntary at this time. Holding orders written.        Laray Anger, DO 06/01/13 [phone]

## 2013-06-01 NOTE — Consult Note (Signed)
Attempted assessing this patient but could not at this time.  He asked this Clinical research associate to leave his room because he does not trust her and any of the nurses.  He was discharged from our inpatient unit yesterday and he turned around and came back to our ER.  Patient is asking for a police protection from nurses and he will only talk to a psychiatrist who will keep his information secret by not publishing it in the Internet.  Patient is hypervigilant peeping through the door.  At this time, we will give him emergency medication to control his paranoia and evaluate later. Diagnosis: Paranoid Schizophrenia, Chronic Plan:  Haldol 10 mg po now, Cogentin I mg po now. Dahlia Byes   PMHNP-BC

## 2013-06-01 NOTE — Progress Notes (Signed)
[facility] CL provided pt with a [facility] Orange Card application.  °

## 2013-06-02 DIAGNOSIS — F29 Unspecified psychosis not due to a substance or known physiological condition: Secondary | ICD-10-CM

## 2013-06-02 MED ORDER — RISPERIDONE 1 MG PO TBDP
1.0000 mg | ORAL_TABLET | Freq: Two times a day (BID) | ORAL | Status: DC
  Administered 2013-06-02 – 2013-06-04 (×5): 1 mg via ORAL
  Filled 2013-06-02 (×6): qty 1

## 2013-06-02 NOTE — ED Notes (Signed)
Dr.Campos made aware of BP 179/108, P 50. No new orders received. Will continue to monitor.

## 2013-06-02 NOTE — Consult Note (Signed)
[facility] Face-to-Face Psychiatry Consult   Reason for Consult:  Psychosis NOS Referring Physician:  Emergency room physician Timothy Jackson is an 42 y.o. male.  Assessment: AXIS I:  Psychotic Disorder NOS AXIS II:  Deferred AXIS III:   Past Medical History  Diagnosis Date  . Hypertension   . Deaf     Right ear  . History of cardiac cath 02/24/2010     LVH, EF of 50-55%, LAD has 10-15% proximal stenosis, by Dr. Sharyn Lull  . Anxiety, generalized   . Erectile dysfunction   . Delusional disorder   . 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  Subjective:   Timothy Jackson is a 42 y.o. male patient admitted with psychosis.Marland Kitchen  HPI:  Patient is a 42 year old African American man who came to the emergency room because " I wants to see psychiatrist".  The patient was discharged from behavioral center on August 29 with a followup to see psychiatrist at [facility].  However patient did not kept appointment.  He stopped taking his Prolixin believing that he does not have any psychiatric illness.  Patient presented with paranoia believing people are following him.  He wants to see psychiatrist because he does not believe any other health care provider.  He admitted poor sleep, irritability anger and racing thoughts.  He refused to talk with nurse practitioner.  Patient told his family requested to see psychiatrist and ex-wife he is here.  Patient told that he may be telling a long story but does not make any sense but it is very important to save the world.  He believes people are trying to take over the world.  Patient appears many paranoid and keep reporting that whatever he is seen should be confidential.   Patient reported that he has very high level agitation and acting.  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  The patient told that he was very popular on face but recently people have blocked him.  He also believed that family is trying to hurt him and is a hospice he going on against him.  Initially he refused that he ever been admitted to the psych hospital however later he accepted that he has been to [facility] in [place] a few times.  He does not believe he has any psychiatric illness.  He believes that his grandfather was involved in constancy about mathematics and he was killed shortly after that.  He also believed that God has given a lot of talent and he is a smart and very intelligent person who can solve everybody's problem.  However he also admitted that recently he has been very anxious because people are trying to kill him and they have been following him.  Patient remains very tangential, hyperverbal and very labile.  Patient does not believe he has any psychiatric illness.  He stopped taking Prolixin which was given on his last discharge.  His attention and focus is limited.  Denies any suicidal thoughts or homicidal thoughts but admitted severe anxiety and racing thoughts.  He appears grandiose, paranoid and hypervigilant.  Patient told his family left him and he moved from [place] 3 weeks ago.  He was living  in a shelter however he was kicked out from the shelter but did not provide much details.   HPI Elements:   Location:  Emergency room. Quality:  Poor. Severity:  Unable to function. Duration:  Ongoing.  Past Psychiatric History: Past Medical History  Diagnosis Date  . Hypertension   . Deaf     Right ear  . History of cardiac cath 02/24/2010     LVH, EF of 50-55%, LAD has 10-15% proximal stenosis, by Dr. Sharyn Lull  . Anxiety, generalized   . Erectile dysfunction   . Delusional disorder   . AVM (arteriovenous malformation) brain  04/20/2000    reports that he has been smoking.  He does not have any smokeless tobacco history on file. He reports that  drinks alcohol. He reports that he uses illicit drugs (Marijuana). Family History  Problem Relation Age of Onset  . Hypertension Father   . Coronary artery disease Maternal Grandmother   . Hypertension Maternal Grandmother   . Diabetes Maternal Grandmother   . Coronary artery disease Maternal Uncle   . Hypertension Maternal Uncle   . Diabetes Maternal Aunt   . Leukemia Maternal Uncle            Allergies:  No Known Allergies  ACT Assessment Complete:  No:   Past Psychiatric History: Diagnosis:  Delusional disorder   Hospitalizations:  Did not provide details but admitted have been to [facility] in [place]   Outpatient Care:  None   Substance Abuse Care:  Denies   Self-Mutilation:  Denies   Suicidal Attempts:  Unknown   Homicidal Behaviors:  Unknown    Violent Behaviors:  Unknown    Place of Residence:  Homeless Marital Status:  The patient has one 58 year old child who lives in [place] Employed/Unemployed:  Unknown Education:  Unknown Family Supports:  None Objective: Blood pressure 128/85, pulse 58, temperature 97.9 F (36.6 C), temperature source Oral, resp. rate 20, SpO2 98.00%.There is no weight on file to calculate BMI. Results for orders placed during the hospital encounter of 06/01/13 (from the past 72 hour(s))  BASIC METABOLIC PANEL     Status: Abnormal   Collection Time    06/01/13  1:37 PM      Result Value Range   Sodium 134 (*) 135 - 145 mEq/L   Potassium 3.8  3.5 - 5.1 mEq/L   Chloride 100  96 - 112 mEq/L   CO2 26  19 - 32 mEq/L   Glucose, Bld 127 (*) 70 - 99 mg/dL   BUN 14  6 - 23 mg/dL   Creatinine, Ser 1.61  0.50 - 1.35 mg/dL   Calcium 9.8  8.4 - 09.6 mg/dL   GFR calc non Af Amer 83 (*) >90 mL/min   GFR calc Af Amer >90  >90 mL/min   Comment: (NOTE)     The eGFR has been calculated using the CKD EPI equation.      This calculation has not been validated in all clinical situations.     eGFR's persistently <90 mL/min signify possible Chronic Kidney     Disease.  CBC WITH DIFFERENTIAL     Status: None   Collection Time    06/01/13  1:37 PM      Result Value Range   WBC 4.8  4.0 - 10.5 K/uL   RBC 5.22  4.22 - 5.81 MIL/uL   Hemoglobin 14.2  13.0 - 17.0 g/dL   HCT 04.5  40.9 - 81.1 %   MCV  80.3  78.0 - 100.0 fL   MCH 27.2  26.0 - 34.0 pg   MCHC 33.9  30.0 - 36.0 g/dL   RDW 16.1  09.6 - 04.5 %   Platelets 264  150 - 400 K/uL   Neutrophils Relative % 57  43 - 77 %   Neutro Abs 2.7  1.7 - 7.7 K/uL   Lymphocytes Relative 33  12 - 46 %   Lymphs Abs 1.6  0.7 - 4.0 K/uL   Monocytes Relative 8  3 - 12 %   Monocytes Absolute 0.4  0.1 - 1.0 K/uL   Eosinophils Relative 2  0 - 5 %   Eosinophils Absolute 0.1  0.0 - 0.7 K/uL   Basophils Relative 0  0 - 1 %   Basophils Absolute 0.0  0.0 - 0.1 K/uL  ETHANOL     Status: None   Collection Time    06/01/13  1:37 PM      Result Value Range   Alcohol, Ethyl (B) <11  0 - 11 mg/dL   Comment:            LOWEST DETECTABLE LIMIT FOR     SERUM ALCOHOL IS 11 mg/dL     FOR MEDICAL PURPOSES ONLY  URINE RAPID DRUG SCREEN (HOSP PERFORMED)     Status: Abnormal   Collection Time    06/01/13  1:50 PM      Result Value Range   Opiates NONE DETECTED  NONE DETECTED   Cocaine NONE DETECTED  NONE DETECTED   Benzodiazepines NONE DETECTED  NONE DETECTED   Amphetamines NONE DETECTED  NONE DETECTED   Tetrahydrocannabinol POSITIVE (*) NONE DETECTED   Barbiturates NONE DETECTED  NONE DETECTED   Comment:            DRUG SCREEN FOR MEDICAL PURPOSES     ONLY.  IF CONFIRMATION IS NEEDED     FOR ANY PURPOSE, NOTIFY LAB     WITHIN 5 DAYS.                LOWEST DETECTABLE LIMITS     FOR URINE DRUG SCREEN     Drug Class       Cutoff (ng/mL)     Amphetamine      1000     Barbiturate      200     Benzodiazepine   200     Tricyclics       300     Opiates          300     Cocaine           300     THC              50   Labs are reviewed and are pertinent for cannabis.  Current Facility-Administered Medications  Medication Dose Route Frequency Provider Last Rate Last Dose  . aspirin chewable tablet 162 mg  162 mg Oral Daily Laray Anger, DO   162 mg at 06/02/13 1024  . benztropine (COGENTIN) tablet 1 mg  1 mg Oral QHS Laray Anger, DO   1 mg at 06/01/13 2223  . hydrochlorothiazide (HYDRODIURIL) tablet 25 mg  25 mg Oral Daily Laray Anger, DO   25 mg at 06/02/13 1024  . hydrOXYzine (ATARAX/VISTARIL) tablet 50 mg  50 mg Oral Q8H PRN Earney Navy, NP      . isosorbide-hydrALAZINE (BIDIL) 20-37.5 MG per tablet 0.5 tablet  0.5 tablet Oral  BID Laray Anger, DO   0.5 tablet at 06/02/13 1024  . lisinopril (PRINIVIL,ZESTRIL) tablet 40 mg  40 mg Oral Daily Laray Anger, DO   40 mg at 06/02/13 1024  . metoprolol tartrate (LOPRESSOR) tablet 50 mg  50 mg Oral BID Laray Anger, DO   50 mg at 06/02/13 1024  . risperiDONE (RISPERDAL M-TABS) disintegrating tablet 1 mg  1 mg Oral BID Cleotis Nipper, MD   1 mg at 06/02/13 1131   Current Outpatient Prescriptions  Medication Sig Dispense Refill  . aspirin 81 MG chewable tablet Chew 162 mg by mouth daily.      . benztropine (COGENTIN) 1 MG tablet Take 1 mg by mouth at bedtime.      . fluPHENAZine (PROLIXIN) 10 MG tablet Take 1 tablet (10 mg total) by mouth at bedtime.  30 tablet  0  . hydrochlorothiazide (HYDRODIURIL) 25 MG tablet Take 1 tablet (25 mg total) by mouth daily.  30 tablet  0  . hydrOXYzine (ATARAX/VISTARIL) 50 MG tablet Take 25 mg by mouth every 8 (eight) hours as needed for itching.      . isosorbide-hydrALAZINE (BIDIL) 20-37.5 MG per tablet Take 0.5 tablets by mouth 2 (two) times daily.  60 tablet  0  . lisinopril (PRINIVIL,ZESTRIL) 40 MG tablet Take 1 tablet (40 mg total) by mouth daily.  30 tablet  0  . metoprolol (LOPRESSOR) 50 MG tablet Take 1 tablet (50 mg total) by mouth 2 (two)  times daily.  60 tablet  0  . potassium chloride (K-DUR,KLOR-CON) 10 MEQ tablet Take 1 tablet (10 mEq total) by mouth daily.  30 tablet  0  . hydrALAZINE (APRESOLINE) 50 MG tablet Take 50 mg by mouth 3 (three) times daily.        Psychiatric Specialty Exam:     Blood pressure 128/85, pulse 58, temperature 97.9 F (36.6 C), temperature source Oral, resp. rate 20, SpO2 98.00%.There is no weight on file to calculate BMI.  General Appearance: Casual  Eye Contact::  Poor  Speech:  Pressured and fast and incoherent at times  Volume:  Increased  Mood:  Euphoric and Irritable  Affect:  Inappropriate and Labile  Thought Process:  Tangential  Orientation:  NA  Thought Content:  Delusions and Obsessions  Suicidal Thoughts:  No  Homicidal Thoughts:  No  Memory:  NA  Judgement:  Poor  Insight:  Lacking  Psychomotor Activity:  Increased  Concentration:  Fair  Recall:  Poor  Akathisia:  No  Handed:  Right  AIMS (if indicated):     Assets:  Physical Health  Sleep:      Treatment Plan Summary: Patient currently have psychosis, delusions, paranoia and grandiosity.  He is unable to function.  He does not believe he has any psychiatric illness.  He stopped taking his Prolixin.  Patient requires inpatient psychiatric treatment when medically cleared.  We will start Risperdal disintegrating tablets.  Recommended inpatient psychiatric treatment.  Jovee Dettinger T. 06/02/2013 11:39 AM

## 2013-06-02 NOTE — ED Notes (Signed)
Pt pleasant with bright affect on approach. Pt denies SI/HI ar plans to harm himself.

## 2013-06-03 ENCOUNTER — Encounter (HOSPITAL_COMMUNITY): Payer: Self-pay | Admitting: Psychiatry

## 2013-06-03 DIAGNOSIS — F22 Delusional disorders: Secondary | ICD-10-CM

## 2013-06-03 MED ORDER — HYDRALAZINE HCL 50 MG PO TABS
50.0000 mg | ORAL_TABLET | Freq: Three times a day (TID) | ORAL | Status: DC
  Administered 2013-06-03 – 2013-06-04 (×3): 50 mg via ORAL
  Filled 2013-06-03 (×8): qty 1

## 2013-06-03 MED ORDER — POTASSIUM CHLORIDE CRYS ER 20 MEQ PO TBCR
10.0000 meq | EXTENDED_RELEASE_TABLET | Freq: Every day | ORAL | Status: DC
  Administered 2013-06-03 – 2013-06-04 (×2): 10 meq via ORAL
  Filled 2013-06-03 (×2): qty 1

## 2013-06-03 NOTE — ED Notes (Signed)
Voice mail left for [employer] at [phone] for request for transportation of patient to [facility] in [place], [place].

## 2013-06-03 NOTE — ED Notes (Signed)
Pt on the phone with a family member, became anxious and requested a "nerve pill". Pt pleasant and cooperative with care. No agitation noted.
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
## 2013-06-03 NOTE — BHH Counselor (Signed)
Pt accepted by Dr. Lolly Mustache to a 400 hall bed when available at [facility]

## 2013-06-03 NOTE — ED Notes (Signed)
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 Both have been verified.
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
## 2013-06-03 NOTE — BH Assessment (Signed)
BHH Assessment Progress Note     Olegario Messier from [facility] contacted stating that Dr. Carroll Sage has accepted the patient. She requested that the nurse call report to [phone] and that when he gets ready to leave, then to please call when he got ready to leave.  Relayed information to [place] in ED. Some issue with whether the patient could go IVC, [facility] or by family. Marcelino Duster, RN states patient is very cooperative, doesn't feel IVC would be appropriate. Suggested maybe he could ask family as I do not know if [facility] will transport that far away. AC at [facility] recommended IVC so patient would have a way to get back to the area.   Shon Baton, LCSW, LCASA, CSW-G

## 2013-06-03 NOTE — ED Notes (Signed)
Patient resting in bed; no s/s of distress noted at this time. Respirations regular and unlabored. Pt states he is looking forward to going for treatment. Pt with bright affect, denies SI.

## 2013-06-03 NOTE — BH Assessment (Signed)
BHH Assessment Progress Note      Patient is in need of a 400 bed, however, there are no beds at this time. Patient is being referred to out of system providers.  [facility]-per Larita Fife, no beds available at this time. [facility]-no beds available, Beth. Sammuel Cooper stated there are no beds available at this time [facility] only have geriatric beds currently, per Lloyd Huger. [facility]-No Beds at this time, per [facility] states they do not have any beds currently, per [facility] does not have any adult beds, per Gunnar Fusi, but will have discharges on Monday.  [facility], [facility] and [facility] have some adult beds; information faxed and awaiting call-backs.  Shon Baton, LCSW, LCASA, CSW-G

## 2013-06-04 NOTE — ED Notes (Signed)
Patient has been discharged to sheriff being admitted to [facility] in [place], [place]. Report has been given to [facility]. Patient denies suicidality, homicidality and no AVH

## 2013-06-04 NOTE — ED Notes (Signed)
Pt is awake and alert, pleasant and cooperative. Pt denies HI,SI, AH or VH. Interacts well with staff. Will continue to monitor for safety. [email or web address]

## 2013-06-05 NOTE — Consult Note (Signed)
Note reviewed and agreed with  

## 2013-06-06 ENCOUNTER — Ambulatory Visit: Payer: Self-pay | Admitting: Cardiology

## 2016-01-28 ENCOUNTER — Encounter: Payer: Self-pay | Admitting: Emergency Medicine

## 2016-01-28 ENCOUNTER — Encounter: Payer: Self-pay | Admitting: Psychiatry

## 2016-01-28 ENCOUNTER — Inpatient Hospital Stay
Admission: EM | Admit: 2016-01-28 | Discharge: 2016-02-12 | DRG: 885 | Disposition: A | Discharge status: Home / Self Care | Payer: Medicare Other | Source: Intra-hospital | Attending: Psychiatry | Admitting: Psychiatry

## 2016-01-28 ENCOUNTER — Emergency Department
Admission: EM | Admit: 2016-01-28 | Discharge: 2016-01-28 | Disposition: A | Discharge status: Other Acute Inpatient Hospital | Payer: Self-pay | Attending: Emergency Medicine | Admitting: Emergency Medicine

## 2016-01-28 DIAGNOSIS — F1020 Alcohol dependence, uncomplicated: Secondary | ICD-10-CM | POA: Diagnosis present

## 2016-01-28 DIAGNOSIS — F17210 Nicotine dependence, cigarettes, uncomplicated: Secondary | ICD-10-CM | POA: Insufficient documentation

## 2016-01-28 DIAGNOSIS — E669 Obesity, unspecified: Secondary | ICD-10-CM | POA: Diagnosis present

## 2016-01-28 DIAGNOSIS — Z638 Other specified problems related to primary support group: Secondary | ICD-10-CM

## 2016-01-28 DIAGNOSIS — Z806 Family history of leukemia: Secondary | ICD-10-CM | POA: Diagnosis not present

## 2016-01-28 DIAGNOSIS — H9191 Unspecified hearing loss, right ear: Secondary | ICD-10-CM | POA: Diagnosis present

## 2016-01-28 DIAGNOSIS — F29 Unspecified psychosis not due to a substance or known physiological condition: Secondary | ICD-10-CM | POA: Insufficient documentation

## 2016-01-28 DIAGNOSIS — Z6841 Body Mass Index (BMI) 40.0 and over, adult: Secondary | ICD-10-CM

## 2016-01-28 DIAGNOSIS — Z8249 Family history of ischemic heart disease and other diseases of the circulatory system: Secondary | ICD-10-CM | POA: Diagnosis not present

## 2016-01-28 DIAGNOSIS — F200 Paranoid schizophrenia: Secondary | ICD-10-CM

## 2016-01-28 DIAGNOSIS — F22 Delusional disorders: Secondary | ICD-10-CM | POA: Insufficient documentation

## 2016-01-28 DIAGNOSIS — I10 Essential (primary) hypertension: Secondary | ICD-10-CM | POA: Diagnosis present

## 2016-01-28 DIAGNOSIS — Z833 Family history of diabetes mellitus: Secondary | ICD-10-CM | POA: Diagnosis not present

## 2016-01-28 DIAGNOSIS — F1220 Cannabis dependence, uncomplicated: Secondary | ICD-10-CM | POA: Diagnosis present

## 2016-01-28 DIAGNOSIS — F333 Major depressive disorder, recurrent, severe with psychotic symptoms: Secondary | ICD-10-CM | POA: Diagnosis present

## 2016-01-28 DIAGNOSIS — F17200 Nicotine dependence, unspecified, uncomplicated: Secondary | ICD-10-CM | POA: Diagnosis present

## 2016-01-28 DIAGNOSIS — R45851 Suicidal ideations: Secondary | ICD-10-CM

## 2016-01-28 DIAGNOSIS — F1290 Cannabis use, unspecified, uncomplicated: Secondary | ICD-10-CM | POA: Insufficient documentation

## 2016-01-28 DIAGNOSIS — Z818 Family history of other mental and behavioral disorders: Secondary | ICD-10-CM

## 2016-01-28 DIAGNOSIS — Z9119 Patient's noncompliance with other medical treatment and regimen: Secondary | ICD-10-CM

## 2016-01-28 DIAGNOSIS — Z9114 Patient's other noncompliance with medication regimen: Secondary | ICD-10-CM | POA: Diagnosis not present

## 2016-01-28 DIAGNOSIS — G4700 Insomnia, unspecified: Secondary | ICD-10-CM | POA: Diagnosis present

## 2016-01-28 DIAGNOSIS — Q282 Arteriovenous malformation of cerebral vessels: Secondary | ICD-10-CM

## 2016-01-28 LAB — URINE DRUG SCREEN, QUALITATIVE (ARMC ONLY)
Amphetamines, Ur Screen: NOT DETECTED
BARBITURATES, UR SCREEN: NOT DETECTED
BENZODIAZEPINE, UR SCRN: NOT DETECTED
Cannabinoid 50 Ng, Ur ~~LOC~~: POSITIVE — AB
Cocaine Metabolite,Ur ~~LOC~~: NOT DETECTED
MDMA (Ecstasy)Ur Screen: NOT DETECTED
METHADONE SCREEN, URINE: NOT DETECTED
OPIATE, UR SCREEN: NOT DETECTED
PHENCYCLIDINE (PCP) UR S: NOT DETECTED
Tricyclic, Ur Screen: NOT DETECTED

## 2016-01-28 LAB — CBC
HCT: 44.5 % (ref 40.0–52.0)
HEMOGLOBIN: 14.6 g/dL (ref 13.0–18.0)
MCH: 26.6 pg (ref 26.0–34.0)
MCHC: 32.8 g/dL (ref 32.0–36.0)
MCV: 81.2 fL (ref 80.0–100.0)
PLATELETS: 291 10*3/uL (ref 150–440)
RBC: 5.49 MIL/uL (ref 4.40–5.90)
RDW: 14.9 % — AB (ref 11.5–14.5)
WBC: 6.4 10*3/uL (ref 3.8–10.6)

## 2016-01-28 LAB — COMPREHENSIVE METABOLIC PANEL
ALK PHOS: 56 U/L (ref 38–126)
ALT: 30 U/L (ref 17–63)
ANION GAP: 11 (ref 5–15)
AST: 23 U/L (ref 15–41)
Albumin: 4.6 g/dL (ref 3.5–5.0)
BILIRUBIN TOTAL: 0.5 mg/dL (ref 0.3–1.2)
BUN: 10 mg/dL (ref 6–20)
CALCIUM: 9.3 mg/dL (ref 8.9–10.3)
CO2: 25 mmol/L (ref 22–32)
CREATININE: 1.12 mg/dL (ref 0.61–1.24)
Chloride: 101 mmol/L (ref 101–111)
Glucose, Bld: 118 mg/dL — ABNORMAL HIGH (ref 65–99)
Potassium: 3.9 mmol/L (ref 3.5–5.1)
SODIUM: 137 mmol/L (ref 135–145)
TOTAL PROTEIN: 8.3 g/dL — AB (ref 6.5–8.1)

## 2016-01-28 LAB — SALICYLATE LEVEL

## 2016-01-28 LAB — ETHANOL

## 2016-01-28 LAB — ACETAMINOPHEN LEVEL

## 2016-01-28 MED ORDER — ALUM & MAG HYDROXIDE-SIMETH 200-200-20 MG/5ML PO SUSP: 30.0000 mL | ORAL | Status: DC | PRN

## 2016-01-28 MED ORDER — PALIPERIDONE ER 3 MG PO TB24: 3.0000 mg | ORAL_TABLET | Freq: Every day | ORAL | Status: DC

## 2016-01-28 MED ORDER — MAGNESIUM HYDROXIDE 400 MG/5ML PO SUSP
30.0000 mL | Freq: Every day | ORAL | Status: DC | PRN
  Administered 2016-01-31: 30 mL via ORAL
  Filled 2016-01-28: qty 30

## 2016-01-28 MED ORDER — ASPIRIN EC 81 MG PO TBEC
81.0000 mg | DELAYED_RELEASE_TABLET | Freq: Every day | ORAL | Status: DC
  Administered 2016-01-28 – 2016-02-02 (×6): 81 mg via ORAL
  Filled 2016-01-28 (×6): qty 1

## 2016-01-28 MED ORDER — PALIPERIDONE ER 3 MG PO TB24
3.0000 mg | ORAL_TABLET | Freq: Every day | ORAL | Status: DC
  Administered 2016-01-28: 3 mg via ORAL
  Filled 2016-01-28 (×2): qty 1

## 2016-01-28 MED ORDER — HYDROCHLOROTHIAZIDE 25 MG PO TABS
25.0000 mg | ORAL_TABLET | Freq: Every day | ORAL | Status: DC
  Administered 2016-01-28: 25 mg via ORAL
  Filled 2016-01-28: qty 1

## 2016-01-28 MED ORDER — ACETAMINOPHEN 325 MG PO TABS: 650.0000 mg | ORAL_TABLET | Freq: Four times a day (QID) | ORAL | Status: DC | PRN

## 2016-01-28 MED ORDER — LISINOPRIL 10 MG PO TABS
20.0000 mg | ORAL_TABLET | Freq: Every day | ORAL | Status: DC
  Administered 2016-01-28 – 2016-01-30 (×3): 20 mg via ORAL
  Filled 2016-01-28 (×2): qty 2

## 2016-01-28 MED ORDER — HYDROCHLOROTHIAZIDE 25 MG PO TABS
25.0000 mg | ORAL_TABLET | Freq: Every day | ORAL | Status: DC
  Administered 2016-01-29: 25 mg via ORAL
  Filled 2016-01-28: qty 1

## 2016-01-28 MED ORDER — LORAZEPAM 1 MG PO TABS
1.0000 mg | ORAL_TABLET | ORAL | Status: AC
  Administered 2016-01-28: 1 mg via ORAL
  Filled 2016-01-28: qty 1
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
## 2016-01-28 NOTE — ED Notes (Addendum)
Patient appears anxious and diaphoretic with [place] [employer] from [facility] with IVC papers.  Patient states, "it feels like ending it would be more of a release."  Patient reports paranoia and anxiety.  Patient reports overeating, drinking alcohol and smoking cigarettes.  Patient is currently making sense at this time.  Patient reports an, "AVM".  Patient states, the doctor told him, "I won't make it to 48."  Patient has history of schizophrenia.  Patient is very tearful in triage.  Reports making poor decisions lately.  Reports unprotected sex, marijuana use, and daily alcohol use.  Patient reports plan to commit suicide by cutting his wrists.
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
## 2016-01-28 NOTE — ED Provider Notes (Signed)
[facility] Emergency Department Provider Note  ____________________________________________  Time seen: Approximately 3:30 PM  I have reviewed the triage vital signs and the nursing notes.   HISTORY  Chief Complaint Medical Clearance    HPI Timothy Jackson is a 45 y.o. male presents for evaluation under involuntary commitment.  The patient reports that he is feeling others want to hurt him, he is having thoughts about hurting himself, and is tearful.  He denies any attempt to injure himself.  Reports his symptoms seem be worsening. This is recently let him to want to start drinking and smoking and is normally not a smoker.  Denies any fevers or chills. States he supposed to be on some medications for schizophrenia, but he stopped taking them a while ago.  He feels like people are watching him and wanting to hurt him.   Past Medical History  Diagnosis Date  . Hypertension   . Deaf     Right ear  . History of cardiac cath 02/24/2010     LVH, EF of 50-55%, LAD has 10-15% proximal stenosis, by Dr. Sharyn Lull  . Anxiety, generalized   . Erectile dysfunction   . Delusional disorder (HCC)   . 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 Delusional disorder(297.1) 05/18/2013  . Generalized anxiety disorder 05/18/2013  . Cannabis abuse 05/18/2013    Past Surgical History  Procedure Laterality Date  . Cardiac catheterization  01/2010    "essentially negative"  . Fracture surgery Left     ORIF left arm as a child    Current Outpatient Rx  Name  Route  Sig  Dispense  Refill  . aspirin 81 MG chewable tablet   Oral   Chew 162 mg by mouth daily.         . benztropine (COGENTIN) 1 MG tablet   Oral   Take 1 mg by mouth at bedtime.         . fluPHENAZine (PROLIXIN) 10 MG tablet   Oral   Take 1 tablet (10 mg total) by mouth at bedtime.   30 tablet   0   . hydrALAZINE (APRESOLINE)  50 MG tablet   Oral   Take 50 mg by mouth 3 (three) times daily.         . hydrochlorothiazide (HYDRODIURIL) 25 MG tablet   Oral   Take 1 tablet (25 mg total) by mouth daily.   30 tablet   0   . hydrOXYzine (ATARAX/VISTARIL) 50 MG tablet   Oral   Take 25 mg by mouth every 8 (eight) hours as needed for itching.         . isosorbide-hydrALAZINE (BIDIL) 20-37.5 MG per tablet   Oral   Take 0.5 tablets by mouth 2 (two) times daily.   60 tablet   0   . lisinopril (PRINIVIL,ZESTRIL) 40 MG tablet   Oral   Take 1 tablet (40 mg total) by mouth daily.   30 tablet   0   . metoprolol (LOPRESSOR) 50 MG tablet   Oral   Take 1 tablet (50 mg total) by mouth 2 (two) times daily.   60 tablet   0   . potassium chloride (K-DUR,KLOR-CON) 10 MEQ tablet   Oral   Take 1 tablet (10 mEq total) by mouth daily.   30 tablet   0     Allergies Review of patient's allergies indicates no known allergies.  Family  History  Problem Relation Age of Onset  . Hypertension Father   . Coronary artery disease Maternal Grandmother   . Hypertension Maternal Grandmother   . Diabetes Maternal Grandmother   . Coronary artery disease Maternal Uncle   . Hypertension Maternal Uncle   . Diabetes Maternal Aunt   . Leukemia Maternal Uncle     Social History Social History  Substance Use Topics  . Smoking status: Current Some Day Smoker    Types: Cigarettes  . Smokeless tobacco: None  . Alcohol Use: 0.0 oz/week     Comment: daily    Review of Systems Constitutional: No fever/chills Eyes: No visual changes. ENT: No sore throat. Cardiovascular: Denies chest pain. Respiratory: Denies shortness of breath. Gastrointestinal: No abdominal pain.  No nausea, no vomiting.  No diarrhea.  No constipation. Genitourinary: Negative for dysuria. Musculoskeletal: Negative for back pain. Skin: Negative for rash. Neurological: Negative for headaches, focal weakness or numbness.  10-point ROS otherwise  negative.  ____________________________________________   PHYSICAL EXAM:  VITAL SIGNS: ED Triage Vitals  Enc Vitals Group     BP 01/28/16 1406 155/65 mmHg     Pulse Rate 01/28/16 1406 99     Resp 01/28/16 1406 20     Temp 01/28/16 1406 98.1 F (36.7 C)     Temp Source 01/28/16 1406 Oral     SpO2 01/28/16 1406 100 %     Weight 01/28/16 1406 315 lb (142.883 kg)     Height 01/28/16 1406  (1.753 m)     Head Cir --      Peak Flow --      Pain Score 01/28/16 1404 0     Pain Loc --      Pain Edu? --      Excl. in GC? --    Constitutional: Alert and oriented. Comfortably, sitting water but looking about somewhat paranoid appearance questionably hearing in tenderness stimuli. Eyes: Conjunctivae are normal. PERRL. EOMI. Head: Atraumatic. Nose: No congestion/rhinnorhea. Mouth/Throat: Mucous membranes are moist.  Oropharynx non-erythematous. Neck: No stridor.   Cardiovascular: Normal rate, regular rhythm. Grossly normal heart sounds.  Good peripheral circulation. Respiratory: Normal respiratory effort.  No retractions. Lungs CTAB. Musculoskeletal: Normal gait. No deficits in arms or legs. Neurologic:  Normal speech and language. No gross focal neurologic deficits are appreciated. No gait instability. Skin:  Skin is warm, dry and intact. No rash noted. Psychiatric: Slightly tearful, slightly paranoid, questionably responding to internal stimuli.  ____________________________________________   LABS (all labs ordered are listed, but only abnormal results are displayed)  Labs Reviewed  COMPREHENSIVE METABOLIC PANEL - Abnormal; Notable for the following:    Glucose, Bld 118 (*)    Total Protein 8.3 (*)    All other components within normal limits  ACETAMINOPHEN LEVEL - Abnormal; Notable for the following:    Acetaminophen (Tylenol), Serum <10 (*)    All other components within normal limits  CBC - Abnormal; Notable for the following:    RDW 14.9 (*)    All other components  within normal limits  URINE DRUG SCREEN, QUALITATIVE ([facility] ONLY) - Abnormal; Notable for the following:    Cannabinoid 50 Ng, Ur [place] POSITIVE (*)    All other components within normal limits  ETHANOL  SALICYLATE LEVEL   ____________________________________________  EKG   ____________________________________________  RADIOLOGY    ____________________________________________   PROCEDURES  Procedure(s) performed: None  Critical Care performed: No  ____________________________________________   INITIAL IMPRESSION / ASSESSMENT AND PLAN / ED COURSE  Pertinent labs & imaging results that were available during my care of the patient were reviewed by me and considered in my medical decision making (see chart for details).  Patient presents for evaluation of paranoia, under involuntary commitment. A history of schizophrenia but off medication.  No acute medical complaint, labs appear medically stable and he is felt to be stable for psychiatric evaluation based on my assessment.  Case discussed with Dr. Toni Amendlapacs, admitting to hospital. ____________________________________________   FINAL CLINICAL IMPRESSION(S) / ED DIAGNOSES  Final diagnoses:  Psychosis, unspecified psychosis type      Sharyn CreamerMark Anyia Gierke, MD 01/28/16 1534

## 2016-01-28 NOTE — ED Notes (Signed)
Report called to Ruthie RN   Pt to transfer at this time

## 2016-01-28 NOTE — BH Assessment (Signed)
[facility] by Dr. Toni Amendlapacs.  Attending Physician will be Dr. Jennet MaduroPucilowska.   Patient [facility], [facility] Nurse Gwen.   Intake Paper Work has been signed and placed on patient chart.  ER staff is aware of the admission Timothy Jackson(Luann, ER Sect.; Dr. York CeriseForbach, ER MD; Windell Mouldinguth Patient's Nurse & Byrd HesselbachMaria, Patient Access).

## 2016-01-28 NOTE — Progress Notes (Signed)
Admission Note:  D: Pt appeared depressed  With  a flat affect.  cooperative with assessment.      A: Pt admitted to unit per protocol, skin assessment patient obese, lower legs  Edematous and dry.  and search done and no contraband found.  Pt  educated on therapeutic milieu rules. Pt was introduced to milieu by nursing staff.  Blood pressure 211/128  R: Pt was receptive to education about the milieu .  15 min safety checks started. Clinical research associatewriter offered support
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
## 2016-01-28 NOTE — Progress Notes (Signed)
Patient ID: Timothy Jackson, male   DOB: 02-Aug-1971, 45 y.o.   MRN: [id number] Called by RN to notify me of very high BP on arrival to ward. BP had been high but not extreme in the ER. PRior notes show a past history of requiring several anti-HTN meds but that was years ago. Ordered one tine HCTZ 25mg  and lisinopril 20mg  but daily only started HCTZ 25 mg until his med needs are better understood. Changed vitals to q shift.

## 2016-01-28 NOTE — ED Notes (Signed)
Pt explained adm process and bhu protocol

## 2016-01-28 NOTE — Progress Notes (Signed)
Called MD concerning BP of 211/133.  MD instructs to continue to monitor patient.  No new orders given.

## 2016-01-29 DIAGNOSIS — F22 Delusional disorders: Secondary | ICD-10-CM

## 2016-01-29 DIAGNOSIS — Q282 Arteriovenous malformation of cerebral vessels: Secondary | ICD-10-CM

## 2016-01-29 DIAGNOSIS — I10 Essential (primary) hypertension: Secondary | ICD-10-CM | POA: Diagnosis present

## 2016-01-29 LAB — LIPID PANEL
CHOL/HDL RATIO: 5.2 ratio
Cholesterol: 193 mg/dL (ref 0–200)
HDL: 37 mg/dL — AB (ref >40)
LDL CALC: 111 mg/dL — AB (ref 0–99)
TRIGLYCERIDES: 226 mg/dL — AB (ref <150)
VLDL: 45 mg/dL — ABNORMAL HIGH (ref 0–40)

## 2016-01-29 LAB — TSH: TSH: 0.814 u[IU]/mL (ref 0.350–4.500)

## 2016-01-29 LAB — HEMOGLOBIN A1C: Hgb A1c MFr Bld: 6.3 % — ABNORMAL HIGH (ref 4.0–6.0)

## 2016-01-29 MED ORDER — LORAZEPAM 1 MG PO TABS
1.0000 mg | ORAL_TABLET | Freq: Once | ORAL | Status: AC
  Administered 2016-01-29: 1 mg via ORAL
  Filled 2016-01-29: qty 1

## 2016-01-29 MED ORDER — HYDROCHLOROTHIAZIDE 50 MG PO TABS
50.0000 mg | ORAL_TABLET | Freq: Every day | ORAL | Status: DC
  Filled 2016-01-29: qty 1

## 2016-01-29 MED ORDER — CLONIDINE HCL 0.1 MG PO TABS
0.1000 mg | ORAL_TABLET | Freq: Once | ORAL | Status: AC
  Administered 2016-01-29: 0.1 mg via ORAL
  Filled 2016-01-29: qty 1

## 2016-01-29 MED ORDER — TRAZODONE HCL 100 MG PO TABS
100.0000 mg | ORAL_TABLET | Freq: Every day | ORAL | Status: DC
  Administered 2016-01-29 – 2016-02-09 (×12): 100 mg via ORAL
  Filled 2016-01-29 (×13): qty 1

## 2016-01-29 MED ORDER — CLONIDINE HCL 0.1 MG PO TABS
0.2000 mg | ORAL_TABLET | Freq: Once | ORAL | Status: AC
  Administered 2016-01-29: 0.2 mg via ORAL
  Filled 2016-01-29: qty 2
  Filled 2016-01-29: qty 1

## 2016-01-29 MED ORDER — PRAZOSIN HCL 1 MG PO CAPS
2.0000 mg | ORAL_CAPSULE | Freq: Two times a day (BID) | ORAL | Status: DC
Start: 2016-01-29 — End: 2016-01-30
  Administered 2016-01-29 – 2016-01-30 (×3): 2 mg via ORAL
  Filled 2016-01-29 (×4): qty 2

## 2016-01-29 NOTE — [facility] Group Notes (Signed)
[facility] Note   01/29/2016 12:13 PM  Participation Quality:  Patient was called to group but did not attend.    Lulu RidingIngle, Ryonna Cimini T, MSW, LCSW
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
## 2016-01-29 NOTE — [facility] Group Notes (Signed)
[facility] Group Notes:  (Nursing/MHT/Case Management/Adjunct)  Date:  01/29/2016  Time:  4:17 AM  Type of Therapy:  Group Therapy  Participation Level:  Did Not Attend    Summary of Progress/Problems:  Timothy Jackson 01/29/2016, 4:17 AM
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
## 2016-01-29 NOTE — Progress Notes (Signed)
Recreation Therapy Notes  Date: 05.04.17 Time: 9:30 am Location: Craft Room  Group Topic: Leisure Education  Goal Area(s) Addresses:  Patient will identify things they are grateful for.  Behavioral Response: Did not attend  Intervention: Grateful Wheel  Activity: Patients were given an "I Am Grateful For" worksheet and instructed to write things they are grateful for under each category.  Education: LRT educated patients on why it is important to be grateful.  Education Outcome: Patient did not attend group.  Clinical Observations/Feedback: Patient did not attend group.  Jacquelynn CreeGreene,Thekla Colborn M, LRT/CTRS 01/29/2016 10:17 AM

## 2016-01-29 NOTE — [facility] Suicide Risk Assessment (Addendum)
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 HTN (hypertension) [I10] 01/29/2016  . 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 Cannabis use disorder, moderate, dependence (HCC) [F12.20] 01/28/2016  . Alcohol use disorder, moderate, dependence (HCC) [F10.20] 01/28/2016  . Suicidal ideation [R45.851] 01/28/2016  . Tobacco use disorder [F17.200] 01/28/2016  . Generalized anxiety disorder [F41.1] 05/18/2013   Subjective Data: Depression, anxiety, psychosis, substance abuse, suicidal ideation.  Continued Clinical Symptoms:  Alcohol Use Disorder Identification Test Final Score (AUDIT): 4 The "Alcohol Use Disorders Identification Test", Guidelines for Use in Primary Care, Second Edition.  World Science [employer] [facility](WHO). Score between 0-7:  no or low risk or alcohol related problems. Score between 8-15:  moderate risk of alcohol related problems. Score between 16-19:  high risk of alcohol related problems. Score 20 or above:  warrants further diagnostic evaluation for alcohol dependence and treatment.   CLINICAL FACTORS:   Severe Anxiety and/or Agitation Panic Attacks Depression:   Delusional Hopelessness Impulsivity Insomnia Severe Currently Psychotic Previous Psychiatric Diagnoses and Treatments Medical Diagnoses and  Treatments/Surgeries   Musculoskeletal: Strength & Muscle Tone: within normal limits Gait & Station: normal Patient leans: N/A  Psychiatric Specialty Exam: Review of Systems  Neurological: Positive for headaches.  Psychiatric/Behavioral: Positive for depression, suicidal ideas, hallucinations and substance abuse. The patient is nervous/anxious and has insomnia.   All other systems reviewed and are negative.   Blood pressure 208/134, pulse 84, temperature 98.4 F (36.9 C), temperature source Oral, resp. rate 20, height 5\' 9"  (1.753 m), weight 180.078 kg (397 lb), SpO2 97 %.Body mass index is 58.6 kg/(m^2).  General Appearance: Casual  Eye Contact::  Good  Speech:  Clear and Coherent  Volume:  Normal  Mood:  Anxious  Affect:  Blunt and Tearful  Thought Process:  Disorganized  Orientation:  Full (Time, Place, and Person)  Thought Content:  Delusions, Obsessions and Paranoid Ideation  Suicidal Thoughts:  Yes.  with intent/plan  Homicidal Thoughts:  No  Memory:  Immediate;   Fair Recent;   Fair Remote;   Fair  Judgement:  Poor  Insight:  Lacking  Psychomotor Activity:  Normal  Concentration:  Fair  Recall:  [employer]  Fund of Knowledge:Fair  Language: Fair  Akathisia:  No  Handed:  Right  AIMS (if indicated):     Assets:  Communication Skills Desire for Improvement Financial Resources/Insurance Housing Resilience Social Support  Sleep:  Number of Hours: 6.75  Cognition: WNL  ADL's:  Intact    COGNITIVE FEATURES THAT CONTRIBUTE TO RISK:  None    SUICIDE RISK:   Severe:  Frequent, intense, and enduring suicidal ideation, specific plan, no subjective intent, but some objective markers of intent (i.e., choice of lethal method), the method is accessible, some limited preparatory behavior, evidence of impaired self-control, severe dysphoria/symptomatology, multiple risk factors present, and few if any protective factors, particularly a lack of social support.  PLAN OF CARE:  Hospital  admission, medication management, substance abuse counseling, discharge planning.  Mr. Thompson is a 45 year old male with a history of delusional disorder admitted for worsening of depression, anxiety and aborted suicide attempt.  1. Suicidal ideation. The patient is able to contract for safety in the hospital.  2. Psychosis. He was started on Invega last night but already doesn't like it. Will try to encourage medication compliance.  3. History of AVM. He was diagnosed years ago, prior to onset of psychosis. He has not been compliant with his follow-up appointments. We will order a head CT scan with and without contrast.  4. Diagnostic clarification. We'll order an MMPI and psychological consults.  5. Anxiety. We will offer hydroxyzine and Minipress for PTSD type symptoms.   6. Hypertension. The patient is on hydrochlorothiazide and lisinopril. He received clonidine this morning due to elevated blood pressure. We will add Minipress.  7. Metabolic syndromes screening. The patient is obese. His triglycerides are slightly elevated, TSH is normal. Hemoglobin A1c and prolactin are pending.  8. Alcohol abuse. The patient has been drinking more lately. We'll offer a brief taper.  9. Substance abuse treatment. The patient is a daily marijuana smoker. He minimizes his problems and declines treatment.   10. Smoking. Nicotine patch is available.  11. Insomnia. We'll start trazodone.   12. Disposition. He will be discharged to home. He will follow-up with a new psychiatrist.  I certify that inpatient services furnished can reasonably be expected to improve the patient's condition.   Kristine Linea, MD 01/29/2016, 10:07 AM

## 2016-01-29 NOTE — [facility] Group Notes (Signed)
[facility] Group Notes:  (Nursing/MHT/Case Management/Adjunct)  Date:  01/29/2016  Time:  4:06 PM  Type of Therapy:  Group Therapy  Participation Level:  Did Not Attend  [name] 01/29/2016, 4:06 PM

## 2016-01-29 NOTE — [facility] Suicide Risk Assessment (Signed)
[facility] INPATIENT:  Family/Significant Other Suicide Prevention Education  Suicide Prevention Education:  Patient Refusal for Family/Significant Other Suicide Prevention Education: The patient Timothy Jackson has refused to provide written consent for family/significant other to be provided Family/Significant Other Suicide Prevention Education during admission and/or prior to discharge.  Physician notified.  Pt refused SPE from the CSW.  Dorothe PeaJonathan F Praise Dolecki 01/29/2016, 2:16 PM

## 2016-01-29 NOTE — Plan of Care (Signed)
Problem: Alteration in mood Goal: LTG-Patient reports reduction in suicidal thoughts (Patient reports reduction in suicidal thoughts and is able to verbalize a safety plan for whenever patient is feeling suicidal)  Outcome: Progressing No SI/HI at this time.      
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
## 2016-01-29 NOTE — [facility] Group Notes (Signed)
[facility] LCSW Group Therapy  01/29/2016 2:19 PM  Type of Therapy:  Group Therapy  Participation Level:  Did Not Attend  Summary of Progress/Problems: Patient was called to group but did not attend.  Lulu RidingIngle, Keen Ewalt T, MSW, LCSW 01/29/2016, 2:19 PM

## 2016-01-29 NOTE — Progress Notes (Addendum)
Patient with depressed affect, slightly anxious. Cooperative behavior with meals, meds and plan of care. Poor adls, encouraged shower and wash clothes, patient states maybe tomorrow. Needs encouragement to leave room for meals and meds. VS monitored and recorded rt high blood pressure this am, early am Clonidine with good effect. Meets with MD and Clinical research associatewriter and patient discusses issues rt admission. Denies SI/HI at this time. Safety maintained. Patient with BLE edema +2, non pitting. MD aware and considers medications and medical consult.
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
## 2016-01-30 LAB — PROLACTIN: PROLACTIN: 31.1 ng/mL — AB (ref 4.0–15.2)

## 2016-01-30 MED ORDER — METOPROLOL TARTRATE 25 MG PO TABS
50.0000 mg | ORAL_TABLET | Freq: Two times a day (BID) | ORAL | Status: DC
  Administered 2016-01-30 – 2016-01-31 (×2): 50 mg via ORAL
  Filled 2016-01-30 (×2): qty 2

## 2016-01-30 MED ORDER — METOPROLOL TARTRATE 25 MG PO TABS
25.0000 mg | ORAL_TABLET | Freq: Two times a day (BID) | ORAL | Status: DC
  Filled 2016-01-30: qty 1

## 2016-01-30 MED ORDER — HYDROCHLOROTHIAZIDE 50 MG PO TABS: 50.0000 mg | ORAL_TABLET | Freq: Every day | ORAL | Status: DC

## 2016-01-30 MED ORDER — CLONIDINE HCL 0.1 MG PO TABS
0.1000 mg | ORAL_TABLET | Freq: Once | ORAL | Status: AC
  Administered 2016-01-31: 0.1 mg via ORAL
  Filled 2016-01-30: qty 1

## 2016-01-30 MED ORDER — HALOPERIDOL 5 MG PO TABS
5.0000 mg | ORAL_TABLET | Freq: Every day | ORAL | Status: DC
  Administered 2016-01-30 – 2016-01-31 (×2): 5 mg via ORAL
  Filled 2016-01-30 (×3): qty 1

## 2016-01-30 MED ORDER — PRAZOSIN HCL 2 MG PO CAPS
4.0000 mg | ORAL_CAPSULE | Freq: Two times a day (BID) | ORAL | Status: DC
  Filled 2016-01-30: qty 2

## 2016-01-30 MED ORDER — LORAZEPAM 2 MG PO TABS
2.0000 mg | ORAL_TABLET | Freq: Four times a day (QID) | ORAL | Status: DC | PRN
  Administered 2016-01-31: 2 mg via ORAL
  Filled 2016-01-30: qty 1

## 2016-01-30 MED ORDER — CLONIDINE HCL 0.1 MG PO TABS
0.1000 mg | ORAL_TABLET | Freq: Once | ORAL | Status: AC
  Administered 2016-01-30: 0.1 mg via ORAL
  Filled 2016-01-30: qty 1

## 2016-01-30 MED ORDER — ISOSORBIDE MONONITRATE ER 30 MG PO TB24
60.0000 mg | ORAL_TABLET | Freq: Every day | ORAL | Status: DC
  Administered 2016-01-31 – 2016-02-02 (×3): 60 mg via ORAL
  Filled 2016-01-30 (×3): qty 2

## 2016-01-30 MED ORDER — LISINOPRIL 10 MG PO TABS
40.0000 mg | ORAL_TABLET | Freq: Every day | ORAL | Status: DC
  Administered 2016-01-31 – 2016-02-02 (×3): 40 mg via ORAL
  Filled 2016-01-30 (×3): qty 4

## 2016-01-30 MED ORDER — HYDROCHLOROTHIAZIDE 25 MG PO TABS
50.0000 mg | ORAL_TABLET | Freq: Every day | ORAL | Status: DC
  Administered 2016-01-30 – 2016-02-02 (×4): 50 mg via ORAL
  Filled 2016-01-30 (×4): qty 2

## 2016-01-30 NOTE — Progress Notes (Signed)
[facility] MD Progress Note  01/30/2016 2:07 PM Timothy Jackson  MRN:  [id number]  Subjective:  Timothy Jackson is very delusional. He has 0 insight into his mental illness and refuses antipsychotics. He believes that he needs to go to a hospital stay for a long time to discuss these issues with the psychiatrist daily. He complains of severe anxiety and still feels suicidal if not helped. There are no somatic complaints. He participates in groups.  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 HTN (hypertension) [I10] 01/29/2016  . 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 Cannabis use disorder, moderate, dependence (HCC) [F12.20] 01/28/2016  . Alcohol use disorder, moderate, dependence (HCC) [F10.20] 01/28/2016  . Suicidal ideation [R45.851] 01/28/2016  . Tobacco use disorder [F17.200] 01/28/2016  . Generalized anxiety disorder [F41.1] 05/18/2013   Total Time spent with patient: 20 minutes  Past Psychiatric History: Delusional disorder.  Past Medical History:  Past Medical History  Diagnosis Date  . Hypertension   . Deaf     Right ear  . History of cardiac cath 02/24/2010     LVH, EF of 50-55%, LAD has 10-15% proximal stenosis, by Dr. Sharyn Lull  . Anxiety, generalized   . Erectile dysfunction   . Delusional disorder (HCC)   . 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 Cardiac catheterization  01/2010    "essentially negative"  . Fracture surgery Left     ORIF left arm as a child   Family History:  Family History  Problem Relation Age of Onset  . Hypertension Father   . Coronary artery disease Maternal Grandmother   . Hypertension Maternal Grandmother   . Diabetes Maternal  Grandmother   . Coronary artery disease Maternal Uncle   . Hypertension Maternal Uncle   . Diabetes Maternal Aunt   . Leukemia Maternal Uncle    Family Psychiatric  History: See H&P. Social History:  History  Alcohol Use  . 0.0 oz/week    Comment: daily     History  Drug Use  . Yes  . Special: Marijuana    Comment: Occassional marijuana use    Social History   Social History  . Marital Status: Single    Spouse Name: N/A  . Number of Children: N/A  . Years of Education: N/A   Occupational History  . desk job     Scientist, water quality   Social History Main Topics  . Smoking status: Current Some Day Smoker    Types: Cigarettes  . Smokeless tobacco: None  . Alcohol Use: 0.0 oz/week     Comment: daily  . Drug Use: Yes    Special: Marijuana     Comment: Occassional marijuana use  . Sexual Activity: Not Currently   Other Topics Concern  . None   Social History Narrative   Additional Social History:                         Sleep: Fair  Appetite:  Good  Current Medications: Current Facility-Administered Medications  Medication Dose Route Frequency Provider Last Rate Last Dose  . acetaminophen (TYLENOL) tablet 650 mg  650 mg Oral Q6H PRN Audery Amel, MD      . alum &  mag hydroxide-simeth (MAALOX/MYLANTA) 200-200-20 MG/5ML suspension 30 mL  30 mL Oral Q4H PRN Audery Amel, MD      . aspirin EC tablet 81 mg  81 mg Oral Daily Audery Amel, MD   81 mg at 01/30/16 0840  . hydrochlorothiazide (HYDRODIURIL) tablet 50 mg  50 mg Oral Daily Shari Prows, MD   50 mg at 01/30/16 1610  . lisinopril (PRINIVIL,ZESTRIL) tablet 20 mg  20 mg Oral Daily Audery Amel, MD   20 mg at 01/30/16 9604  . magnesium hydroxide (MILK OF MAGNESIA) suspension 30 mL  30 mL Oral Daily PRN Audery Amel, MD      . paliperidone (INVEGA) 24 hr tablet 3 mg  3 mg Oral QHS Audery Amel, MD   3 mg at 01/28/16 2148  . prazosin (MINIPRESS) capsule 4 mg  4 mg Oral BID Shari Prows, MD      . traZODone (DESYREL) tablet 100 mg  100 mg Oral QHS Shari Prows, MD   100 mg at 01/29/16 2137    Lab Results:  Results for orders placed or performed during the hospital encounter of 01/28/16 (from the past 48 hour(s))  Hemoglobin A1c     Status: Abnormal   Collection Time: 01/29/16  7:04 AM  Result Value Ref Range   Hgb A1c MFr Bld 6.3 (H) 4.0 - 6.0 %  Lipid panel     Status: Abnormal   Collection Time: 01/29/16  7:04 AM  Result Value Ref Range   Cholesterol 193 0 - 200 mg/dL   Triglycerides 540 (H) <150 mg/dL   HDL 37 (L) >98 mg/dL   Total CHOL/HDL Ratio 5.2 RATIO   VLDL 45 (H) 0 - 40 mg/dL   LDL Cholesterol 119 (H) 0 - 99 mg/dL    Comment:        Total Cholesterol/HDL:CHD Risk Coronary Heart Disease Risk Table                     Men   Women  1/2 Average Risk   3.4   3.3  Average Risk       5.0   4.4  2 X Average Risk   9.6   7.1  3 X Average Risk  23.4   11.0        Use the calculated Patient Ratio above and the CHD Risk Table to determine the patient's CHD Risk.        ATP III CLASSIFICATION (LDL):  <100     mg/dL   Optimal  147-829  mg/dL   Near or Above                    Optimal  130-159  mg/dL   Borderline  562-130  mg/dL   High  >865     mg/dL   Very High   Prolactin     Status: Abnormal   Collection Time: 01/29/16  7:04 AM  Result Value Ref Range   Prolactin 31.1 (H) 4.0 - 15.2 ng/mL    Comment: (NOTE) Performed At: [facility] [address] [place], [place] [id number] Mila Homer MD MW:[id number]   TSH     Status: None   Collection Time: 01/29/16  7:04 AM  Result Value Ref Range   TSH 0.814 0.350 - 4.500 uIU/mL    Blood Alcohol level:  Lab Results  Component Value Date   ETH <5 01/28/2016   ETH <  11 06/01/2013    Physical Findings: AIMS: Facial and Oral Movements Muscles of Facial Expression: None, normal Lips and Perioral Area: None, normal Jaw: None, normal Tongue: None, normal,Extremity  Movements Upper (arms, wrists, hands, fingers): None, normal Lower (legs, knees, ankles, toes): None, normal, Trunk Movements Neck, shoulders, hips: None, normal, Overall Severity Severity of abnormal movements (highest score from questions above): None, normal Incapacitation due to abnormal movements: None, normal Patient's awareness of abnormal movements (rate only patient's report): No Awareness, Dental Status Current problems with teeth and/or dentures?: No Does patient usually wear dentures?: No  CIWA:    COWS:     Musculoskeletal: Strength & Muscle Tone: within normal limits Gait & Station: normal Patient leans: N/A  Psychiatric Specialty Exam: Review of Systems  Psychiatric/Behavioral: Positive for hallucinations.  All other systems reviewed and are negative.   Blood pressure 171/102, pulse 78, temperature 97.9 F (36.6 C), temperature source Oral, resp. rate 16, height  (1.753 m), weight 180.078 kg (397 lb), SpO2 97 %.Body mass index is 58.6 kg/(m^2).  General Appearance: Casual  Eye Contact::  Good  Speech:  Clear and Coherent  Volume:  Normal  Mood:  Anxious  Affect:  Appropriate  Thought Process:  Goal Directed  Orientation:  Full (Time, Place, and Person)  Thought Content:  Delusions and Paranoid Ideation  Suicidal Thoughts:  Yes.  with intent/plan  Homicidal Thoughts:  No  Memory:  Immediate;   Fair Recent;   Fair Remote;   Fair  Judgement:  Poor  Insight:  Lacking  Psychomotor Activity:  Normal  Concentration:  Fair  Recall:  [employer] of Knowledge:Fair  Language: Fair  Akathisia:  No  Handed:  Right  AIMS (if indicated):     Assets:  Communication Skills Desire for Improvement Housing Physical Health Resilience Social Support  ADL's:  Intact  Cognition: WNL  Sleep:  Number of Hours: 6.5   Treatment Plan Summary: Daily contact with patient to assess and evaluate symptoms and progress in treatment and Medication management   Timothy Jackson  is a 45 year old male with a history of delusional disorder admitted for worsening of depression, anxiety and aborted suicide attempt.  1. Suicidal ideation. The patient is able to contract for safety in the hospital.  2. Psychosis. He was started on Invega last night but already doesn't like it. Will try to encourage medication compliance but he refuses.  3. History of AVM. He was diagnosed years ago, prior to onset of psychosis. He has not been compliant with his follow-up appointments. I spoke briefly with Dr. Thad Ranger, neurology, who would consider MRI when patient lass anxious/psychotic.  4. Diagnostic clarification. We'll order an MMPI and psychological consults.  5. Anxiety. We will offer hydroxyzine and Minipress for PTSD type symptoms.   6. Hypertension. The patient is on hydrochlorothiazide, lisinopril, and Metoprolol. He received clonidine this morning again due to elevated blood pressure. We will increase Minipress.  7. Metabolic syndromes screening. The patient is obese. His triglycerides are slightly elevated, TSH is normal. Hemoglobin A1c 6.3, prolactin 21.1.  8. Alcohol abuse. The patient has been drinking more lately, 2-3 beers a day. We will monitor for symptoms of withdrawal.  9. Substance abuse treatment. The patient is a daily marijuana smoker. He minimizes his problems and declines treatment.   10. Smoking. Nicotine patch is available.  11. Insomnia. We'll start trazodone.   12. Disposition. He will be discharged to home. He will follow-up with a new psychiatrist.  Kristine Linea,  MD 01/30/2016, 2:07 PM
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
## 2016-01-30 NOTE — Progress Notes (Signed)
BP elevated this AM. MD paged but no answer. 1000 scheduled BP meds given.

## 2016-01-30 NOTE — [facility] Group Notes (Signed)
[facility] LCSW Group Therapy  01/30/2016 2:03 PM  Type of Therapy:  Group Therapy  Participation Level:  Did Not Attend  Summary of Progress/Problems: Patient was called to group but did not attend.   Mera Gunkel T, MSW, LCSW 01/30/2016, 2:03 PM 

## 2016-01-30 NOTE — Consult Note (Signed)
Medical Consultation  Timothy Jackson:[id number] DOB: 04/08/1971 DOA: 01/28/2016 PCP: No PCP Per Patient   Requesting physician: Dr Fredia Beets Date of consultation: 01/30/2016 Reason for consultation: HTN  Impression/Recommendations 45 y/o male with hx of AVM and HTN (not taking medications for over 3 years) with accelerated HTN and likely rebound HTN from clonidine.  1. Accelerated HTN : Would not advise use of clonidine as this causes rebound HTN. Goal SBP should be about 160-180 over next week then after 1-2 weeks may decrease to goal <140/80. Increase dose of Lisinopril. D/c prazosin that was just added by primary team and start IMDUR and metoprolol. Continue HCTZ with BMP in am. Needs ECHO  2. Hx of AVM: Consult Neurology if needed.  3. Tobacco dependence: patient counseled 3 minutes. Nicotine patch offered.  4. Psychosis with SI: Management per Psychiatry  5. Morbid obesity with likely OSA: Encouraged weight loss and needs outpatient sleep evaluation.  6. EtOh abuse: Over past few months he hs been drinking on a regular basis, last drink a few days ago. Advise CIWA.   Chief Complaint: elevated BP  HPI:   45 y/o male with HTN not compliant with medications presented with SI and psychosis. BP has been elevated since hospitalization. He was given clonidine times two and started on minipress by primary team in addition to HCTZ and lisinopril.    Review of Systems  Constitutional: Negative for fever, chills weight loss HENT: Negative for ear pain, nosebleeds, congestion, facial swelling, rhinorrhea, neck pain, neck stiffness and ear discharge.   Respiratory: Negative for cough, shortness of breath, wheezing  Cardiovascular: Negative for chest pain, palpitations and +++ leg swelling.  Gastrointestinal: Negative for heartburn, abdominal pain, vomiting, diarrhea or consitpation Genitourinary: Negative for dysuria, urgency, frequency, hematuria Musculoskeletal: Negative for  back pain or joint pain Neurological: Negative for dizziness, seizures, syncope, focal weakness,  numbness and headaches.  Hematological: Does not bruise/bleed easily.  Psychiatric/Behavioral: +depressed +Si +anxiety  Past Medical History  Diagnosis Date  . Hypertension   . Deaf     Right ear  . History of cardiac cath 02/24/2010     LVH, EF of 50-55%, LAD has 10-15% proximal stenosis, by Dr. Sharyn Lull  . Anxiety, generalized   . Erectile dysfunction   . Delusional disorder (HCC)   . 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 Cardiac catheterization  01/2010    "essentially negative"  . Fracture surgery Left     ORIF left arm as a child   Social History:  reports that he has been smoking Cigarettes.  He does not have any smokeless tobacco  He reports that he drinks alcohol. He reports that he uses illicit drugs (Marijuana).  No Known Allergies Family History  Problem Relation Age of Onset  . Hypertension Father   . Coronary artery disease Maternal Grandmother   . Hypertension Maternal Grandmother   . Diabetes Maternal Grandmother   . Coronary artery disease Maternal Uncle   . Hypertension Maternal Uncle   . Diabetes Maternal Aunt   . Leukemia Maternal Uncle     Not taking medications prior to admission      Physical Exam: Blood pressure 156/98, pulse 78, temperature 97.9 F (36.6 C), temperature source Oral, resp. rate 16, height  (1.753 m), weight 180.078 kg (397 lb), SpO2 97 %. @ [employer]   01/28/16 1833  Weight: 180.078 kg (397 lb)    Intake/Output Summary (Last 24 hours)  at 01/30/16 2234 Last data filed at 01/30/16 1303  Gross per 24 hour  Intake    240 ml  Output      0 ml  Net    240 ml     Constitutional: Appears morbidly obese. No distress. HENT: Normocephalic. Marland Kitchen. Oropharynx is clear and moist.  Eyes: Conjunctivae and EOM are normal. PERRLA, no scleral icterus.  Neck:  Normal ROM. Neck supple. No JVD. No tracheal deviation. CVS: RRR, S1/S2 +, no murmurs, no gallops, no carotid bruit.  Pulmonary: Effort and breath sounds normal, no stridor, rhonchi, wheezes, rales.  Abdominal: Soft. BS +,  no distension, tenderness, rebound or guarding.  Musculoskeletal: Normal range of motion. ++LE edema,  no tenderness.  Neuro: Alert. CN 2-12 grossly intact. No focal deficits. Skin: Skin is warm and dry. No rash noted. Psychiatric: depressed affect    Labs  Basic Metabolic Panel:  Recent Labs Lab 01/28/16 1414  NA 137  K 3.9  CL 101  CO2 25  GLUCOSE 118*  BUN 10  CREATININE 1.12  CALCIUM 9.3   Liver Function Tests:  Recent Labs Lab 01/28/16 1414  AST 23  ALT 30  ALKPHOS 56  BILITOT 0.5  PROT 8.3*  ALBUMIN 4.6   No results for input(s): LIPASE, AMYLASE in the last 168 hours.  CBC:  Recent Labs Lab 01/28/16 1414  WBC 6.4  HGB 14.6  HCT 44.5  MCV 81.2  PLT 291   Cardiac Enzymes: No results for input(s): CKTOTAL, CKMB, CKMBINDEX, TROPONINI in the last 168 hours. BNP: Invalid input(s): POCBNP CBG: No results for input(s): GLUCAP in the last 168 hours.  Radiological Exams: No results found.  EKG: ordered for am  Thank you for allowing me to participate in the care of your patient. We will continue to follow.   Note: This dictation was prepared with Dragon dictation along with smaller phrase technology. Any transcriptional errors that result from this process are unintentional.  Time spent: 45 minutes  Parrie Rasco, MD

## 2016-01-30 NOTE — [facility] Group Notes (Signed)
[facility] Group Notes:  (Nursing/MHT/Case Management/Adjunct)  Date:  01/30/2016  Time:  4:05 PM  Type of Therapy:  Psychoeducational Skills  Participation Level:  Minimal  Participation Quality:  Inattentive  Affect:  Flat  Cognitive:  Lacking  Insight:  None  Engagement in Group:  None  Modes of Intervention:  Discussion, Education and Support  Summary of Progress/Problems:  Timothy MilroyLaquanda Jackson Timothy Jackson 01/30/2016, 4:05 PM

## 2016-01-30 NOTE — Progress Notes (Signed)
D: Patient appears flat and disorganized. Also showing some signs of paranoia. Denies SI/HI/AVH at this time. Anxiety at beginning of shift so MD was notified. Also BP was elevated. MD was notified.  A: 0.1 mg Clonidine was given for BP, Ativan 2mg  was given for anxiety. Encouragement was provided.  R: Patient refused Invega 3mg  stating he believes it's giving causing him to have high BP and HA. He has remained calm and cooperative. Safety maintained with 15 min checks.

## 2016-01-30 NOTE — [facility] Group Notes (Signed)
[facility] Group Notes:  (Nursing/MHT/Case Management/Adjunct)  Date:  01/30/2016  Time:  1:26 AM  Type of Therapy:  Psychoeducational Skills  Participation Level:  Did Not Attend  Summary of Progress/Problems:  Shelbee Apgar R Starlette Thurow 01/30/2016, 1:26 AM

## 2016-01-30 NOTE — Plan of Care (Signed)
Problem: Alteration in thought process Goal: LTG-Patient has not harmed self or others in at least 2 days Outcome: Progressing Patient has remained free from harm since admission     

## 2016-01-30 NOTE — Progress Notes (Signed)
Recreation Therapy Notes  Date: 05.05.17 Time: 9:30 am Location: Craft Room  Group Topic: Coping Skills  Goal Area(s) Addresses:  Patient will participate in healthy coping skill.  Behavioral Response: Did not attend  Intervention: Coloring  Activity: Patients were instructed to color coloring sheets and think about what emotions they were experiencing as well as what they were thinking about while they were coloring.  Education: LRT educated patients on healthy coping skills.  Education Outcome: Patient did not attend group.  Clinical Observations/Feedback: Patient did not attend group.  Jacquelynn CreeGreene,Lanyiah Brix M, LRT/CTRS 01/30/2016 10:19 AM

## 2016-01-31 MED ORDER — HYDRALAZINE HCL 50 MG PO TABS
50.0000 mg | ORAL_TABLET | Freq: Three times a day (TID) | ORAL | Status: DC
  Administered 2016-01-31 – 2016-02-02 (×6): 50 mg via ORAL
  Filled 2016-01-31 (×9): qty 1

## 2016-01-31 MED ORDER — METOPROLOL TARTRATE 25 MG PO TABS
100.0000 mg | ORAL_TABLET | Freq: Two times a day (BID) | ORAL | Status: DC
  Administered 2016-01-31 – 2016-02-02 (×4): 100 mg via ORAL
  Filled 2016-01-31 (×4): qty 4

## 2016-01-31 MED ORDER — HYDROXYZINE HCL 50 MG PO TABS
50.0000 mg | ORAL_TABLET | Freq: Three times a day (TID) | ORAL | Status: DC | PRN
  Administered 2016-01-31 – 2016-02-12 (×10): 50 mg via ORAL
  Filled 2016-01-31 (×10): qty 1

## 2016-01-31 NOTE — Progress Notes (Signed)
D: Patient is flat. Still stating paranoid thoughts. Complaining of anxiety.Denies SI/HI/AVH. Denies pain. Blood pressure has been elevated all evening and was having trouble decreasing it. MD was notified. Patient stated stuff like, "this might sound crazy but I think you need to watch the people that are admitted after me." Thinking they are coming after him.  A: Consult was put in for hospitilist for HTN. PRNs given throughout evening to help maintain stable BP. Encouragement provided. Patient taught how to deep breathe. PRN Ativan given. R: Patient was compliant with medication. He has remained calm and cooperative. Safety maintained with 15 min checks.

## 2016-01-31 NOTE — [facility] Group Notes (Signed)
[facility] LCSW Group Therapy  01/31/2016 1:57 PM  Type of Therapy:  Group Therapy  Participation Level:  Did Not Attend  Modes of Intervention:  Discussion, Education, Socialization and Support  Summary of Progress/Problems: Pt will identify unhealthy thoughts and how they impact their emotions and behavior. Pt will be encouraged to discuss these thoughts, emotions and behaviors with the group.   Olena Willy L Shellee Streng MSW, LCSWA  01/31/2016, 1:57 PM      

## 2016-01-31 NOTE — Consult Note (Signed)
[facility] - [place] [facility]                                                                                                                                                                                            Patient Demographics   Timothy Jackson, is a 45 y.o. male, DOB - 05/24/1971, RUE:[id number]  Admit date - 01/28/2016   Admitting Physician No admitting provider for patient encounter.  Outpatient Primary MD for the patient is No PCP Per Patient   LOS - 3  Subjective:Patient denies any complaints. Blood pressure continues to be elevated.     Review of Systems:   CONSTITUTIONAL: No documented fever. No fatigue, weakness. No weight gain, no weight loss.  EYES: No blurry or double vision.  ENT: No tinnitus. No postnasal drip. No redness of the oropharynx.  RESPIRATORY: No cough, no wheeze, no hemoptysis. No dyspnea.  CARDIOVASCULAR: No chest pain. No orthopnea. No palpitations. No syncope.  GASTROINTESTINAL: No nausea, no vomiting or diarrhea. No abdominal pain. No melena or hematochezia.  GENITOURINARY: No dysuria or hematuria.  ENDOCRINE: No polyuria or nocturia. No heat or cold intolerance.  HEMATOLOGY: No anemia. No bruising. No bleeding.  INTEGUMENTARY: No rashes. No lesions.  MUSCULOSKELETAL: No arthritis. No swelling. No gout.  NEUROLOGIC: No numbness, tingling, or ataxia. No seizure-type activity.  PSYCHIATRIC: Depressed   Vitals:   Filed Vitals:   01/31/16 0014 01/31/16 0205 01/31/16 0700 01/31/16 0707  BP: 180/120 169/87 147/118 167/121  Pulse:  65 73 75  Temp:   98.4 F (36.9 C)   TempSrc:   Oral   Resp:      Height:      Weight:      SpO2:        Wt Readings from Last 3 Encounters:  01/28/16 180.078 kg (397 lb)  01/28/16 142.883 kg (315 lb)  05/16/13 123.378 kg (272 lb)     Intake/Output Summary (Last 24 hours) at 01/31/16 1252 Last data filed at 01/31/16 0851  Gross per 24 hour  Intake    240 ml  Output      0  ml  Net    240 ml    Physical Exam:   GENERAL: Pleasant-appearing in no apparent distress.  HEAD, EYES, EARS, NOSE AND THROAT: Atraumatic, normocephalic. Extraocular muscles are intact. Pupils equal and reactive to light. Sclerae anicteric. No conjunctival injection. No oro-pharyngeal erythema.  NECK: Supple. There is no jugular venous distention. No bruits, no lymphadenopathy, no thyromegaly.  HEART: Regular rate and rhythm,. No murmurs, no rubs, no clicks.  LUNGS: Clear to auscultation bilaterally. No rales  or rhonchi. No wheezes.  ABDOMEN: Soft, flat, nontender, nondistended. Has good bowel sounds. No hepatosplenomegaly appreciated.  EXTREMITIES: No evidence of any cyanosis, clubbing, or peripheral edema.  +2 pedal and radial pulses bilaterally.  NEUROLOGIC: The patient is alert, awake, and oriented x3 with no focal motor or sensory deficits appreciated bilaterally.  SKIN: Moist and warm with no rashes appreciated.  Psych: Depressed affect LN: No inguinal LN enlargement    Antibiotics   Anti-infectives    None      Medications   Scheduled Meds: . aspirin EC  81 mg Oral Daily  . haloperidol  5 mg Oral QHS  . hydrALAZINE  50 mg Oral Q8H  . hydrochlorothiazide  50 mg Oral Daily  . isosorbide mononitrate  60 mg Oral Daily  . lisinopril  40 mg Oral Daily  . metoprolol tartrate  100 mg Oral BID  . traZODone  100 mg Oral QHS   Continuous Infusions:  PRN Meds:.acetaminophen, alum & mag hydroxide-simeth, hydrOXYzine, magnesium hydroxide   Data Review:   Micro Results No results found for this or any previous visit (from the past 240 hour(s)).  Radiology Reports No results found.   CBC  Recent Labs Lab 01/28/16 1414  WBC 6.4  HGB 14.6  HCT 44.5  PLT 291  MCV 81.2  MCH 26.6  MCHC 32.8  RDW 14.9*    Chemistries   Recent Labs Lab 01/28/16 1414  NA 137  K 3.9  CL 101  CO2 25  GLUCOSE 118*  BUN 10  CREATININE 1.12  CALCIUM 9.3  AST 23  ALT 30   ALKPHOS 56  BILITOT 0.5   ------------------------------------------------------------------------------------------------------------------ estimated creatinine clearance is 132 mL/min (by C-G formula based on Cr of 1.12). ------------------------------------------------------------------------------------------------------------------  Recent Labs  01/29/16 0704  HGBA1C 6.3*   ------------------------------------------------------------------------------------------------------------------  Recent Labs  01/29/16 0704  CHOL 193  HDL 37*  LDLCALC 111*  TRIG 226*  CHOLHDL 5.2   ------------------------------------------------------------------------------------------------------------------  Recent Labs  01/29/16 0704  TSH 0.814   ------------------------------------------------------------------------------------------------------------------ No results for input(s): VITAMINB12, FOLATE, FERRITIN, TIBC, IRON, RETICCTPCT in the last 72 hours.  Coagulation profile No results for input(s): INR, PROTIME in the last 168 hours.  No results for input(s): DDIMER in the last 72 hours.  Cardiac Enzymes No results for input(s): CKMB, TROPONINI, MYOGLOBIN in the last 168 hours.  Invalid input(s): CK ------------------------------------------------------------------------------------------------------------------ Invalid input(s): POCBNP    Assessment & Plan   45 y/o male with hx of AVM and HTN (not taking medications for over 3 years) with accelerated HTN and likely rebound HTN from clonidine.  1. Accelerated HTN blood pressure still under poor control. I will increase his Lopressor 200 mg twice a day ad hydralazine to his current regimen Continue with hydrochlorothiazide M Doran lisinopril  2. Hx of AVM: Asymptomatic  3. Tobacco dependence:  coucned yesterday  4. Psychosis with SI: Management per Psychiatry  5. Morbid obesity with likely OSA: Encouraged weight loss  and needs outpatient sleep evaluation.  6. EtOh abuse: No evidence of withdrawal     Code Status Orders        Start     Ordered   01/28/16 1849  Full code   Continuous     01/28/16 1848    Code Status History    Date Active Date Inactive Code Status Order ID Comments User Context   05/15/2013  8:26 PM 05/17/2013  1:38 AM Full Code [id number]  Vida RollerBrian D Miller, MD ED  DVT Prophylaxis  Ambulatory  Lab Results  Component Value Date   PLT 291 01/28/2016     Time Spent in minutes  Greater than 50% of time spent in care coordination and counseling patient regarding the condition and plan of care.   Auburn Bilberry M.D on 01/31/2016 at 12:52 PM  Between 7am to 6pm - Pager - [phone]  After 6pm go to [email or web address] - password [email or web address] [facility]  [facility]   Office  [phone]

## 2016-01-31 NOTE — Progress Notes (Signed)
BP was elevated this AM. MD was notified. MD stated to get daily BP meds.

## 2016-01-31 NOTE — Plan of Care (Signed)
Problem: Alteration in mood Goal: STG-Patient is able to discuss feelings and issues (Patient is able to discuss feelings and issues leading to depression)  Outcome: Progressing Patient is able to discuss feelings and issues with staff currently

## 2016-01-31 NOTE — Progress Notes (Signed)
[facility] MD Progress Note  01/31/2016 5:36 PM Timothy Jackson  MRN:  [id number]  Subjective:  Timothy Jackson is still very delusional and very paranoid about his medication especially psychiatry ones. He secluded to his room and sits in the darkness and does not participate in programming. He however is cooperating with his antihypertensives. His blood pressure has been consistently elevated over a period of a few days most likely due to the fact that he was noncompliant with treatment at home. Medicine input is greatly appreciated.  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 HTN (hypertension) [I10] 01/29/2016  . 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 Cannabis use disorder, moderate, dependence (HCC) [F12.20] 01/28/2016  . Alcohol use disorder, moderate, dependence (HCC) [F10.20] 01/28/2016  . Suicidal ideation [R45.851] 01/28/2016  . Tobacco use disorder [F17.200] 01/28/2016  . Generalized anxiety disorder [F41.1] 05/18/2013   Total Time spent with patient: 20 minutes  Past Psychiatric History: Depression, delusional disorder.  Past Medical History:  Past Medical History  Diagnosis Date  . Hypertension   . Deaf     Right ear  . History of cardiac cath 02/24/2010     LVH, EF of 50-55%, LAD has 10-15% proximal stenosis, by Dr. Sharyn Lull  . Anxiety, generalized   . Erectile dysfunction   . Delusional disorder (HCC)   . 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 Cardiac catheterization  01/2010    "essentially negative"  . Fracture surgery Left     ORIF left arm as a child   Family History:  Family History  Problem Relation Age of Onset  . Hypertension Father   .  Coronary artery disease Maternal Grandmother   . Hypertension Maternal Grandmother   . Diabetes Maternal Grandmother   . Coronary artery disease Maternal Uncle   . Hypertension Maternal Uncle   . Diabetes Maternal Aunt   . Leukemia Maternal Uncle    Family Psychiatric  History: See H&P. Social History:  History  Alcohol Use  . 0.0 oz/week    Comment: daily     History  Drug Use  . Yes  . Special: Marijuana    Comment: Occassional marijuana use    Social History   Social History  . Marital Status: Single    Spouse Name: N/A  . Number of Children: N/A  . Years of Education: N/A   Occupational History  . desk job     Scientist, water quality   Social History Main Topics  . Smoking status: Current Some Day Smoker    Types: Cigarettes  . Smokeless tobacco: None  . Alcohol Use: 0.0 oz/week     Comment: daily  . Drug Use: Yes    Special: Marijuana     Comment: Occassional marijuana use  . Sexual Activity: Not Currently   Other Topics Concern  . None   Social History Narrative   Additional Social History:                         Sleep: Fair  Appetite:  Fair  Current Medications: Current Facility-Administered Medications  Medication Dose Route Frequency Provider Last Rate Last Dose  . acetaminophen (TYLENOL) tablet 650 mg  650 mg Oral Q6H PRN  Audery Amel, MD      . alum & mag hydroxide-simeth (MAALOX/MYLANTA) 200-200-20 MG/5ML suspension 30 mL  30 mL Oral Q4H PRN Audery Amel, MD      . aspirin EC tablet 81 mg  81 mg Oral Daily Audery Amel, MD   81 mg at 01/31/16 1047  . haloperidol (HALDOL) tablet 5 mg  5 mg Oral QHS Kegan Shepardson B Bentlee Benningfield, MD   5 mg at 01/30/16 2243  . hydrALAZINE (APRESOLINE) tablet 50 mg  50 mg Oral Q8H Auburn Bilberry, MD   50 mg at 01/31/16 1454  . hydrochlorothiazide (HYDRODIURIL) tablet 50 mg  50 mg Oral Daily Shari Prows, MD   50 mg at 01/31/16 0707  . hydrOXYzine (ATARAX/VISTARIL) tablet 50 mg  50 mg Oral TID PRN Shari Prows, MD   50 mg at 01/31/16 1050  . isosorbide mononitrate (IMDUR) 24 hr tablet 60 mg  60 mg Oral Daily Adrian Saran, MD   60 mg at 01/31/16 1048  . lisinopril (PRINIVIL,ZESTRIL) tablet 40 mg  40 mg Oral Daily Adrian Saran, MD   40 mg at 01/31/16 0707  . magnesium hydroxide (MILK OF MAGNESIA) suspension 30 mL  30 mL Oral Daily PRN Audery Amel, MD      . metoprolol tartrate (LOPRESSOR) tablet 100 mg  100 mg Oral BID Auburn Bilberry, MD      . traZODone (DESYREL) tablet 100 mg  100 mg Oral QHS Jamiesha Victoria B Sheria Rosello, MD   100 mg at 01/30/16 2242    Lab Results: No results found for this or any previous visit (from the past 48 hour(s)).  Blood Alcohol level:  Lab Results  Component Value Date   [facility] <5 01/28/2016   ETH <11 06/01/2013    Physical Findings: AIMS: Facial and Oral Movements Muscles of Facial Expression: None, normal Lips and Perioral Area: None, normal Jaw: None, normal Tongue: None, normal,Extremity Movements Upper (arms, wrists, hands, fingers): None, normal Lower (legs, knees, ankles, toes): None, normal, Trunk Movements Neck, shoulders, hips: None, normal, Overall Severity Severity of abnormal movements (highest score from questions above): None, normal Incapacitation due to abnormal movements: None, normal Patient's awareness of abnormal movements (rate only patient's report): No Awareness, Dental Status Current problems with teeth and/or dentures?: No Does patient usually wear dentures?: No  CIWA:    COWS:     Musculoskeletal: Strength & Muscle Tone: within normal limits Gait & Station: normal Patient leans: N/A  Psychiatric Specialty Exam: Review of Systems  Psychiatric/Behavioral: Positive for hallucinations and substance abuse.  All other systems reviewed and are negative.   Blood pressure 163/96, pulse 86, temperature 98.4 F (36.9 C), temperature source Oral, resp. rate 16, height 5\' 9"  (1.753 m), weight 180.078 kg (397 lb), SpO2 98 %.Body mass  index is 58.6 kg/(m^2).  General Appearance: Casual  Eye Contact::  Good  Speech:  Clear and Coherent  Volume:  Normal  Mood:  Anxious  Affect:  Constricted  Thought Process:  Disorganized  Orientation:  Full (Time, Place, and Person)  Thought Content:  Delusions and Paranoid Ideation  Suicidal Thoughts:  Yes.  with intent/plan  Homicidal Thoughts:  No  Memory:  Immediate;   Fair Recent;   Fair Remote;   Fair  Judgement:  Poor  Insight:  Lacking  Psychomotor Activity:  Decreased  Concentration:  Fair  Recall:  [employer] of Knowledge:Fair  Language: Fair  Akathisia:  No  Handed:  Right  AIMS (if  indicated):     Assets:  Communication Skills Desire for Improvement Housing Resilience Social Support  ADL's:  Intact  Cognition: WNL  Sleep:  Number of Hours: 6.5   Treatment Plan Summary: Daily contact with patient to assess and evaluate symptoms and progress in treatment and Medication management   Timothy Jackson is a 45 year old male with a history of delusional disorder admitted for worsening of depression, anxiety and aborted suicide attempt.  1. Suicidal ideation. The patient is able to contract for safety in the hospital.  2. Psychosis. He was started on Invega last night but already doesn't like it. We switched to Haldol. We will continue to encourage medication compliance.  3. History of AVM. He was diagnosed years ago, prior to onset of psychosis. He has not been compliant with his follow-up appointments. I spoke briefly with Dr. Thad Rangereynolds, neurology, who would consider MRI when patient lass anxious/psychotic.  4. Diagnostic clarification. We'll order an MMPI and psychological consults.  5. Anxiety. We will offer hydroxyzine for anxiety.  6. Hypertension. Medicine consult is greatly appreciated. Lopressor and Hydralazine were added to his regimen of hydrochlorothiazide, lisinopril, and Metoprolol. Blood pressure is an acceptable range today.  7. Metabolic syndromes  screening. The patient is obese. His triglycerides are slightly elevated, TSH is normal. Hemoglobin A1c 6.3, prolactin 21.1.  8. Alcohol abuse. The patient has been drinking more lately, 2-3 beers a day. There are no symptoms of alcohol withdrawal.   9. Substance abuse treatment. The patient is a daily marijuana smoker. He minimizes his problems and declines treatment.   10. Smoking. Nicotine patch is available.  11. Insomnia. We started trazodone.   12. Disposition. He will be discharged to home. He will follow-up with a new psychiatrist.   Kristine LineaJolanta Kyran Whittier, MD 01/31/2016, 5:36 PM

## 2016-01-31 NOTE — Plan of Care (Signed)
Problem: Diagnosis: Increased Risk For Suicide Attempt Goal: STG-Patient Will Comply With Medication Regime Outcome: Progressing Patient was compliant with all medications this evening.      

## 2016-01-31 NOTE — Progress Notes (Signed)
D:  Patient is alert and oriented on the unit this shift.  Patient did not attend groups today.  Patient denies suicidal ideation, homicidal ideation, auditory or visual hallucinations at the present time.  Patient slept for the majority of the day this shift.   A:  Scheduled medications are administered to patient as per MD orders.  Emotional support and encouragement are provided.  Patient is maintained on q.15 minute safety checks.  Patient is informed to notify staff with questions or concerns. R:  No adverse medication reactions are noted.  Patient is cooperative with medication administration and treatment plan today.  Patient is receptive, calm and cooperative on the unit at this time.  Patient interacts minimally with others on the unit this shift.  Patient contracts for safety at this time.  Patient remains safe at this time.

## 2016-01-31 NOTE — Progress Notes (Signed)
Timothy Jackson was cooperative with treatment, he spent most of the evening resting in bed, though content seemed to be logical and coherent in conversation. He reported to Clinical research associatewriter he doesn't like sitting in the dayroom with other peers because of various conversation topics. He did complained of constipation and he was given milk of magnesia and encouraged to drink fluids it was effective. He appears to be sleep at this time.

## 2016-01-31 NOTE — Plan of Care (Signed)
Problem: Alteration in mood Goal: LTG-Pt's behavior demonstrates decreased signs of depression (Patient's behavior demonstrates decreased signs of depression to the point the patient is safe to return home and continue treatment in an outpatient setting)  Outcome: Progressing Patient reports that his mood is slightly improved although he is very tired

## 2016-02-01 MED ORDER — CLONIDINE HCL 0.1 MG PO TABS
0.1000 mg | ORAL_TABLET | Freq: Four times a day (QID) | ORAL | Status: DC | PRN
  Filled 2016-02-01: qty 1

## 2016-02-01 MED ORDER — HALOPERIDOL 5 MG PO TABS
10.0000 mg | ORAL_TABLET | Freq: Every day | ORAL | Status: DC
  Administered 2016-02-01 – 2016-02-05 (×5): 10 mg via ORAL
  Filled 2016-02-01 (×5): qty 2

## 2016-02-01 NOTE — Progress Notes (Signed)
Patient stayed in bed most of the shift.Stated that he had a dream about an African Elephant turned to a beautiful lady and he is going to write story about this.Patient is pleasant & cooperative on approach.Stated that the pill he takes at night [ Invega] brings his BP high.Compliant with his medication.Did not attend group.

## 2016-02-01 NOTE — Plan of Care (Signed)
Problem: Diagnosis: Increased Risk For Suicide Attempt Goal: LTG-Patient Will Report Improved Mood and Deny Suicidal LTG (by discharge) Patient will report improved mood and deny suicidal ideation.  Outcome: Progressing Denies suicidal ideations.     

## 2016-02-01 NOTE — [facility] Group Notes (Signed)
[facility] LCSW Group Therapy  02/01/2016 12:52 PM  Type of Therapy:  Group Therapy  Participation Level:  Did Not Attend  Modes of Intervention:  Discussion, Education, Socialization and Support  Summary of Progress/Problems: Boundaries: Patients defined boundaries and discussed the importance of them. Patients identified their own boundaries and how they feel when they are crossed. Patients discussed ways to create and/ or improve their personal boundaries.    [name] L Dalal Livengood MSW, LCSWA  02/01/2016, 12:52 PM  

## 2016-02-01 NOTE — Consult Note (Addendum)
[facility] - [place] at [facility]                                                                                                                                                                                            Patient Demographics   Timothy Jackson, is a 45 y.o. male, DOB - May 16, 1971, ZOX:[id number]  Admit date - 01/28/2016   Admitting Physician No admitting provider for patient encounter.  Outpatient Primary MD for the patient is No PCP Per Patient   LOS - 4  Subjective: Patient questions about his medications. Blood pressure continues to be very high.    Review of Systems:   CONSTITUTIONAL: No documented fever. No fatigue, weakness. No weight gain, no weight loss.  EYES: No blurry or double vision.  ENT: No tinnitus. No postnasal drip. No redness of the oropharynx.  RESPIRATORY: No cough, no wheeze, no hemoptysis. No dyspnea.  CARDIOVASCULAR: No chest pain. No orthopnea. No palpitations. No syncope.  GASTROINTESTINAL: No nausea, no vomiting or diarrhea. No abdominal pain. No melena or hematochezia.  GENITOURINARY: No dysuria or hematuria.  ENDOCRINE: No polyuria or nocturia. No heat or cold intolerance.  HEMATOLOGY: No anemia. No bruising. No bleeding.  INTEGUMENTARY: No rashes. No lesions.  MUSCULOSKELETAL: No arthritis. No swelling. No gout.  NEUROLOGIC: No numbness, tingling, or ataxia. No seizure-type activity.  PSYCHIATRIC: Depressed   Vitals:   Filed Vitals:   01/31/16 0707 01/31/16 1700 02/01/16 0700 02/01/16 1034  BP: 167/121 163/96 189/132 133/98  Pulse: 75 86 67 58  Temp:   98.5 F (36.9 C)   TempSrc:   Oral   Resp:      Height:      Weight:      SpO2:  98%      Wt Readings from Last 3 Encounters:  01/28/16 180.078 kg (397 lb)  01/28/16 142.883 kg (315 lb)  05/16/13 123.378 kg (272 lb)     Intake/Output Summary (Last 24 hours) at 02/01/16 1149 Last data filed at 01/31/16 1722  Gross per 24 hour  Intake    360 ml   Output      0 ml  Net    360 ml    Physical Exam:   GENERAL: Pleasant-appearing in no apparent distress.  HEAD, EYES, EARS, NOSE AND THROAT: Atraumatic, normocephalic. Extraocular muscles are intact. Pupils equal and reactive to light. Sclerae anicteric. No conjunctival injection. No oro-pharyngeal erythema.  NECK: Supple. There is no jugular venous distention. No bruits, no lymphadenopathy, no thyromegaly.  HEART: Regular rate and rhythm,. No murmurs, no rubs, no clicks.  LUNGS: Clear to auscultation bilaterally.  No rales or rhonchi. No wheezes.  ABDOMEN: Soft, flat, nontender, nondistended. Has good bowel sounds. No hepatosplenomegaly appreciated.  EXTREMITIES: No evidence of any cyanosis, clubbing, or peripheral edema.  +2 pedal and radial pulses bilaterally.  NEUROLOGIC: The patient is alert, awake, and oriented x3 with no focal motor or sensory deficits appreciated bilaterally.  SKIN: Moist and warm with no rashes appreciated.  Psych: Depressed affect LN: No inguinal LN enlargement    Antibiotics   Anti-infectives    None      Medications   Scheduled Meds: . aspirin EC  81 mg Oral Daily  . haloperidol  5 mg Oral QHS  . hydrALAZINE  50 mg Oral Q8H  . hydrochlorothiazide  50 mg Oral Daily  . isosorbide mononitrate  60 mg Oral Daily  . lisinopril  40 mg Oral Daily  . metoprolol tartrate  100 mg Oral BID  . traZODone  100 mg Oral QHS   Continuous Infusions:  PRN Meds:.acetaminophen, alum & mag hydroxide-simeth, cloNIDine, hydrOXYzine, magnesium hydroxide   Data Review:   Micro Results No results found for this or any previous visit (from the past 240 hour(s)).  Radiology Reports No results found.   CBC  Recent Labs Lab 01/28/16 1414  WBC 6.4  HGB 14.6  HCT 44.5  PLT 291  MCV 81.2  MCH 26.6  MCHC 32.8  RDW 14.9*    Chemistries   Recent Labs Lab 01/28/16 1414  NA 137  K 3.9  CL 101  CO2 25  GLUCOSE 118*  BUN 10  CREATININE 1.12  CALCIUM  9.3  AST 23  ALT 30  ALKPHOS 56  BILITOT 0.5   ------------------------------------------------------------------------------------------------------------------ estimated creatinine clearance is 132 mL/min (by C-G formula based on Cr of 1.12). ------------------------------------------------------------------------------------------------------------------ No results for input(s): HGBA1C in the last 72 hours. ------------------------------------------------------------------------------------------------------------------ No results for input(s): CHOL, HDL, LDLCALC, TRIG, CHOLHDL, LDLDIRECT in the last 72 hours. ------------------------------------------------------------------------------------------------------------------ No results for input(s): TSH, T4TOTAL, T3FREE, THYROIDAB in the last 72 hours.  Invalid input(s): FREET3 ------------------------------------------------------------------------------------------------------------------ No results for input(s): VITAMINB12, FOLATE, FERRITIN, TIBC, IRON, RETICCTPCT in the last 72 hours.  Coagulation profile No results for input(s): INR, PROTIME in the last 168 hours.  No results for input(s): DDIMER in the last 72 hours.  Cardiac Enzymes No results for input(s): CKMB, TROPONINI, MYOGLOBIN in the last 168 hours.  Invalid input(s): CK ------------------------------------------------------------------------------------------------------------------ Invalid input(s): POCBNP    Assessment & Plan   45 y/o male with hx of AVM and HTN (not taking medications for over 3 years) with accelerated HTN and likely rebound HTN from clonidine.  1. Accelerated HTN blood pressure still under poor control. Blood pressure is not under control. I will start him on clonidine. Increase his hydralazine dosage. Continue isosorbide mononitrate and lisinopril as well as metoprolol.  2. Hx of AVM: Asymptomatic  3. Tobacco dependence:  Recommended to  stop  4. Psychosis with SI: Management per Psychiatry  5. Morbid obesity with likely OSA: Encouraged weight loss and needs outpatient sleep evaluation.  6. EtOh abuse: No evidence of withdrawal     Code Status Orders        Start     Ordered   01/28/16 1849  Full code   Continuous     01/28/16 1848    Code Status History    Date Active Date Inactive Code Status Order ID Comments User Context   05/15/2013  8:26 PM 05/17/2013  1:38 AM Full Code [id number]  Vida RollerBrian D Miller, MD ED  DVT Prophylaxis  Ambulatory  Lab Results  Component Value Date   PLT 291 01/28/2016     Time Spent in minutes45min  Greater than 50% of time spent in care coordination and counseling patient regarding the condition and plan of care.   Auburn Bilberry M.D on 02/01/2016 at 11:49 AM  Between 7am to 6pm - Pager - [phone]  After 6pm go to [email or web address] - password [email or web address] [facility]  [facility]   Office  [phone]

## 2016-02-01 NOTE — Progress Notes (Signed)
[facility] MD Progress Note  02/01/2016 6:13 PM Timothy [facility]  MRN:  [id number]  Subjective:  Timothy [facility] is slightly better today. He now believes that maybe he is more depressed that he appreciated before. He has been taking his Haldol as prescribed even though he believes that it causes elevated blood pressure. Indeed his blood pressure was 189/132 this morning. It normalized after morning dose of his antihypertensives which are multiple. He stays in bed most of the day. He probably has sleep apnea he is rather heavy. He started ordering healthy food to do "his part".  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 HTN (hypertension) [I10] 01/29/2016  . 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 Cannabis use disorder, moderate, dependence (HCC) [F12.20] 01/28/2016  . Alcohol use disorder, moderate, dependence (HCC) [F10.20] 01/28/2016  . Suicidal ideation [R45.851] 01/28/2016  . Tobacco use disorder [F17.200] 01/28/2016  . Generalized anxiety disorder [F41.1] 05/18/2013   Total Time spent with patient: 20 minutes  Past Psychiatric History: Depression, psychosis.  Past Medical History:  Past Medical History  Diagnosis Date  . Hypertension   . Deaf     Right ear  . History of cardiac cath 02/24/2010     LVH, EF of 50-55%, LAD has 10-15% proximal stenosis, by Dr. Sharyn LullHarwani  . Anxiety, generalized   . Erectile dysfunction   . Delusional disorder (HCC)   . 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 Cardiac catheterization  01/2010    "essentially negative"  . Fracture surgery Left     ORIF left arm as a child   Family History:  Family History  Problem Relation Age of Onset   . Hypertension Father   . Coronary artery disease Maternal Grandmother   . Hypertension Maternal Grandmother   . Diabetes Maternal Grandmother   . Coronary artery disease Maternal Uncle   . Hypertension Maternal Uncle   . Diabetes Maternal Aunt   . Leukemia Maternal Uncle    Family Psychiatric  History: See H&P. Social History:  History  Alcohol Use  . 0.0 oz/week    Comment: daily     History  Drug Use  . Yes  . Special: Marijuana    Comment: Occassional marijuana use    Social History   Social History  . Marital Status: Single    Spouse Name: N/A  . Number of Children: N/A  . Years of Education: N/A   Occupational History  . desk job     Scientist, [employer]   Social History Main Topics  . Smoking status: Current Some Day Smoker    Types: Cigarettes  . Smokeless tobacco: None  . Alcohol Use: 0.0 oz/week     Comment: daily  . Drug Use: Yes    Special: Marijuana     Comment: Occassional marijuana use  . Sexual Activity: Not Currently   Other Topics Concern  . None   Social History Narrative   Additional Social History:                         Sleep: Fair  Appetite:  Fair  Current Medications: Current Facility-Administered Medications  Medication Dose Route Frequency Provider Last Rate Last Dose  . acetaminophen (  TYLENOL) tablet 650 mg  650 mg Oral Q6H PRN Audery Amel, MD      . alum & mag hydroxide-simeth (MAALOX/MYLANTA) 200-200-20 MG/5ML suspension 30 mL  30 mL Oral Q4H PRN Audery Amel, MD      . aspirin EC tablet 81 mg  81 mg Oral Daily Audery Amel, MD   81 mg at 02/01/16 0913  . cloNIDine (CATAPRES) tablet 0.1 mg  0.1 mg Oral Q6H PRN Auburn Bilberry, MD      . haloperidol (HALDOL) tablet 10 mg  10 mg Oral QHS Chardonnay Holzmann B Gumecindo Hopkin, MD      . hydrALAZINE (APRESOLINE) tablet 50 mg  50 mg Oral Q8H Auburn Bilberry, MD   50 mg at 02/01/16 1403  . hydrochlorothiazide (HYDRODIURIL) tablet 50 mg  50 mg Oral Daily Shady Bradish B Jessee Mezera, MD   50  mg at 02/01/16 0913  . hydrOXYzine (ATARAX/VISTARIL) tablet 50 mg  50 mg Oral TID PRN Shari Prows, MD   50 mg at 01/31/16 2153  . isosorbide mononitrate (IMDUR) 24 hr tablet 60 mg  60 mg Oral Daily Adrian Saran, MD   60 mg at 02/01/16 0913  . lisinopril (PRINIVIL,ZESTRIL) tablet 40 mg  40 mg Oral Daily Adrian Saran, MD   40 mg at 02/01/16 0913  . magnesium hydroxide (MILK OF MAGNESIA) suspension 30 mL  30 mL Oral Daily PRN Audery Amel, MD   30 mL at 01/31/16 2153  . metoprolol tartrate (LOPRESSOR) tablet 100 mg  100 mg Oral BID Auburn Bilberry, MD   100 mg at 02/01/16 0913  . traZODone (DESYREL) tablet 100 mg  100 mg Oral QHS Shari Prows, MD   100 mg at 01/31/16 2153    Lab Results: No results found for this or any previous visit (from the past 48 hour(s)).  Blood Alcohol level:  Lab Results  Component Value Date   [facility] <5 01/28/2016   ETH <11 06/01/2013    Physical Findings: AIMS: Facial and Oral Movements Muscles of Facial Expression: None, normal Lips and Perioral Area: None, normal Jaw: None, normal Tongue: None, normal,Extremity Movements Upper (arms, wrists, hands, fingers): None, normal Lower (legs, knees, ankles, toes): None, normal, Trunk Movements Neck, shoulders, hips: None, normal, Overall Severity Severity of abnormal movements (highest score from questions above): None, normal Incapacitation due to abnormal movements: None, normal Patient's awareness of abnormal movements (rate only patient's report): No Awareness, Dental Status Current problems with teeth and/or dentures?: No Does patient usually wear dentures?: No  CIWA:    COWS:     Musculoskeletal: Strength & Muscle Tone: within normal limits Gait & Station: normal Patient leans: N/A  Psychiatric Specialty Exam: Review of Systems  Psychiatric/Behavioral: Positive for depression and hallucinations.  All other systems reviewed and are negative.   Blood pressure 133/98, pulse 58, temperature  98.5 F (36.9 C), temperature source Oral, resp. rate 16, height 5\' 9"  (1.753 m), weight 180.078 kg (397 lb), SpO2 98 %.Body mass index is 58.6 kg/(m^2).  General Appearance: Casual  Eye Contact::  Good  Speech:  Clear and Coherent  Volume:  Normal  Mood:  Depressed, Hopeless and Worthless  Affect:  Blunt  Thought Process:  Goal Directed  Orientation:  Full (Time, Place, and Person)  Thought Content:  Delusions and Paranoid Ideation  Suicidal Thoughts:  No  Homicidal Thoughts:  No  Memory:  Immediate;   Fair Recent;   Fair Remote;   Fair  Judgement:  Poor  Insight:  Lacking  Psychomotor Activity:  Psychomotor Retardation  Concentration:  Fair  Recall:  Fair  Fund of Knowledge:Fair  Language: Fair  Akathisia:  No  Handed:  Right  AIMS (if indicated):     Assets:  Communication Skills Desire for Improvement Housing Physical Health Resilience Social Support  ADL's:  Intact  Cognition: WNL  Sleep:  Number of Hours: 6   Treatment Plan Summary: Daily contact with patient to assess and evaluate symptoms and progress in treatment and Medication management   Timothy [facility] is a 45 year old male with a history of delusional disorder admitted for worsening of depression, anxiety and aborted suicide attempt.  1. Suicidal ideation. The patient is able to contract for safety in the hospital.  2. Psychosis. He was started on Invega last night but already doesn't like it. We switched to Haldol and increased dose to 10 mg nightly. I will start Prozac for depression as well.  3. History of AVM. He was diagnosed years ago, prior to onset of psychosis. He has not been compliant with his follow-up appointments. I spoke briefly with Dr. Thad Ranger, neurology, who would consider MRI when patient lass anxious/psychotic.  4. Diagnostic clarification. We'll order an MMPI and psychological consults.  5. Anxiety. We will offer hydroxyzine for anxiety.  6. Hypertension. Medicine consult is greatly  appreciated. Lopressor and Hydralazine were added to his regimen of hydrochlorothiazide, lisinopril, and Metoprolol. Blood pressure is an acceptable range today.  7. Metabolic syndromes screening. The patient is obese. His triglycerides are slightly elevated, TSH is normal. Hemoglobin A1c 6.3, prolactin 21.1.  8. Alcohol abuse. The patient has been drinking more lately, 2-3 beers a day. There are no symptoms of alcohol withdrawal.   9. Substance abuse treatment. The patient is a daily marijuana smoker. He minimizes his problems and declines treatment.   10. Smoking. Nicotine patch is available.  11. Insomnia. We started trazodone.   12. Disposition. He will be discharged to home. He will follow-up with a new psychiatrist.   Kristine Linea, MD 02/01/2016, 6:13 PM

## 2016-02-02 MED ORDER — LISINOPRIL 10 MG PO TABS
40.0000 mg | ORAL_TABLET | Freq: Every day | ORAL | Status: DC
  Administered 2016-02-03 – 2016-02-12 (×10): 40 mg via ORAL
  Filled 2016-02-02 (×10): qty 4

## 2016-02-02 MED ORDER — METOPROLOL TARTRATE 25 MG PO TABS
100.0000 mg | ORAL_TABLET | Freq: Two times a day (BID) | ORAL | Status: DC
  Administered 2016-02-02 – 2016-02-12 (×18): 100 mg via ORAL
  Filled 2016-02-02 (×19): qty 4

## 2016-02-02 MED ORDER — HYDROCHLOROTHIAZIDE 25 MG PO TABS
50.0000 mg | ORAL_TABLET | Freq: Every day | ORAL | Status: DC
  Administered 2016-02-03 – 2016-02-12 (×10): 50 mg via ORAL
  Filled 2016-02-02 (×11): qty 2

## 2016-02-02 MED ORDER — ASPIRIN EC 81 MG PO TBEC
81.0000 mg | DELAYED_RELEASE_TABLET | Freq: Every day | ORAL | Status: DC
  Administered 2016-02-03 – 2016-02-12 (×10): 81 mg via ORAL
  Filled 2016-02-02 (×10): qty 1

## 2016-02-02 MED ORDER — HYDRALAZINE HCL 50 MG PO TABS
100.0000 mg | ORAL_TABLET | Freq: Three times a day (TID) | ORAL | Status: DC
  Administered 2016-02-02 – 2016-02-12 (×30): 100 mg via ORAL
  Filled 2016-02-02 (×30): qty 2

## 2016-02-02 MED ORDER — ISOSORBIDE MONONITRATE ER 30 MG PO TB24
60.0000 mg | ORAL_TABLET | Freq: Every day | ORAL | Status: DC
  Administered 2016-02-03 – 2016-02-04 (×2): 60 mg via ORAL
  Filled 2016-02-02 (×2): qty 2

## 2016-02-02 MED ORDER — HYDRALAZINE HCL 50 MG PO TABS: 100.0000 mg | ORAL_TABLET | Freq: Three times a day (TID) | ORAL | Status: DC

## 2016-02-02 NOTE — Progress Notes (Signed)
Recreation Therapy Notes  Date: 05.08.17 Time: 9:30 am Location: Craft Room  Group Topic: Self-expression  Goal Area(s) Addresses:  Patient will identify one color per emotion listed on the wheel. Patient will verbalize one emotion experienced during session. Patient will be educated on other forms of self-expression.  Behavioral Response: Did not attend  Intervention: Emotion Wheel  Activity: Patients were given an Emotion Wheel worksheet and instructed to pick a color for each emotion listed.  Education: LRT educated group on other forms of self-expression.  Education Outcome: Patient did not attend group.   Clinical Observations/Feedback: Patient did not attend group.  Geana Walts M, LRT/CTRS 02/02/2016 10:15 AM 

## 2016-02-02 NOTE — Progress Notes (Signed)
[facility] MD Progress Note  02/02/2016 12:05 PM Timothy [facility]  MRN:  [id number]  Subjective:  Timothy [facility] reports improvement but is slightly less preoccupied with his paranoid delusions and is able to touch on other topics. He accepts medications now and tolerates them well. He still has not been participating in any groups and stays in bed most of the time. He reports symptoms of depression since yesterday. His blood pressure continues to be elevated at times. Medicine felt is greatly appreciated.  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 HTN (hypertension) [I10] 01/29/2016  . 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 Cannabis use disorder, moderate, dependence (HCC) [F12.20] 01/28/2016  . Alcohol use disorder, moderate, dependence (HCC) [F10.20] 01/28/2016  . Suicidal ideation [R45.851] 01/28/2016  . Tobacco use disorder [F17.200] 01/28/2016  . Generalized anxiety disorder [F41.1] 05/18/2013   Total Time spent with patient: 20 minutes  Past Psychiatric History: Depression, psychosis.  Past Medical History:  Past Medical History  Diagnosis Date  . Hypertension   . Deaf     Right ear  . History of cardiac cath 02/24/2010     LVH, EF of 50-55%, LAD has 10-15% proximal stenosis, by Dr. Sharyn Lull  . Anxiety, generalized   . Erectile dysfunction   . Delusional disorder (HCC)   . 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 Cardiac catheterization  01/2010    "essentially negative"  . Fracture surgery Left     ORIF left arm as a child   Family History:  Family History  Problem Relation Age of Onset  . Hypertension Father   . Coronary artery disease Maternal Grandmother    . Hypertension Maternal Grandmother   . Diabetes Maternal Grandmother   . Coronary artery disease Maternal Uncle   . Hypertension Maternal Uncle   . Diabetes Maternal Aunt   . Leukemia Maternal Uncle    Family Psychiatric  History: See H&P. Social History:  History  Alcohol Use  . 0.0 oz/week    Comment: daily     History  Drug Use  . Yes  . Special: Marijuana    Comment: Occassional marijuana use    Social History   Social History  . Marital Status: Single    Spouse Name: N/A  . Number of Children: N/A  . Years of Education: N/A   Occupational History  . desk job     Scientist, water quality   Social History Main Topics  . Smoking status: Current Some Day Smoker    Types: Cigarettes  . Smokeless tobacco: None  . Alcohol Use: 0.0 oz/week     Comment: daily  . Drug Use: Yes    Special: Marijuana     Comment: Occassional marijuana use  . Sexual Activity: Not Currently   Other Topics Concern  . None   Social History Narrative   Additional Social History:                         Sleep: Fair  Appetite:  Fair  Current Medications: Current Facility-Administered Medications  Medication Dose Route Frequency Provider Last Rate Last Dose  . acetaminophen (TYLENOL) tablet 650 mg  650 mg Oral Q6H PRN Audery Amel, MD      .  alum & mag hydroxide-simeth (MAALOX/MYLANTA) 200-200-20 MG/5ML suspension 30 mL  30 mL Oral Q4H PRN Audery Amel, MD      . Melene Muller ON 02/03/2016] aspirin EC tablet 81 mg  81 mg Oral Q breakfast Jacquese Hackman B Daleen Steinhaus, MD      . cloNIDine (CATAPRES) tablet 0.1 mg  0.1 mg Oral Q6H PRN Auburn Bilberry, MD      . haloperidol (HALDOL) tablet 10 mg  10 mg Oral QHS Tere Mcconaughey B Aniketh Huberty, MD   10 mg at 02/01/16 2116  . hydrALAZINE (APRESOLINE) tablet 100 mg  100 mg Oral Q8H Auburn Bilberry, MD   100 mg at 02/02/16 0830  . [START ON 02/03/2016] hydrochlorothiazide (HYDRODIURIL) tablet 50 mg  50 mg Oral Q breakfast Greenlee Ancheta B Nereida Schepp, MD      .  hydrOXYzine (ATARAX/VISTARIL) tablet 50 mg  50 mg Oral TID PRN Shari Prows, MD   50 mg at 02/01/16 2119  . [START ON 02/03/2016] isosorbide mononitrate (IMDUR) 24 hr tablet 60 mg  60 mg Oral Q breakfast Inetta Dicke B Ren Aspinall, MD      . Melene Muller ON 02/03/2016] lisinopril (PRINIVIL,ZESTRIL) tablet 40 mg  40 mg Oral Q breakfast Amareon Phung B Aadarsh Cozort, MD      . magnesium hydroxide (MILK OF MAGNESIA) suspension 30 mL  30 mL Oral Daily PRN Audery Amel, MD   30 mL at 01/31/16 2153  . metoprolol tartrate (LOPRESSOR) tablet 100 mg  100 mg Oral BID AC & HS Beniah Magnan B Erinn Huskins, MD      . traZODone (DESYREL) tablet 100 mg  100 mg Oral QHS Joss Mcdill B Khira Cudmore, MD   100 mg at 02/01/16 2116    Lab Results: No results found for this or any previous visit (from the past 48 hour(s)).  Blood Alcohol level:  Lab Results  Component Value Date   [facility] <5 01/28/2016   ETH <11 06/01/2013    Physical Findings: AIMS: Facial and Oral Movements Muscles of Facial Expression: None, normal Lips and Perioral Area: None, normal Jaw: None, normal Tongue: None, normal,Extremity Movements Upper (arms, wrists, hands, fingers): None, normal Lower (legs, knees, ankles, toes): None, normal, Trunk Movements Neck, shoulders, hips: None, normal, Overall Severity Severity of abnormal movements (highest score from questions above): None, normal Incapacitation due to abnormal movements: None, normal Patient's awareness of abnormal movements (rate only patient's report): No Awareness, Dental Status Current problems with teeth and/or dentures?: No Does patient usually wear dentures?: No  CIWA:    COWS:     Musculoskeletal: Strength & Muscle Tone: within normal limits Gait & Station: normal Patient leans: N/A  Psychiatric Specialty Exam: Review of Systems  Psychiatric/Behavioral: Positive for depression and hallucinations. The patient is nervous/anxious.   All other systems reviewed and are negative.   Blood pressure  146/100, pulse 62, temperature 98.5 F (36.9 C), temperature source Oral, resp. rate 20, height 5\' 9"  (1.753 m), weight 180.078 kg (397 lb), SpO2 98 %.Body mass index is 58.6 kg/(m^2).  General Appearance: Fairly Groomed  Patent attorney::  Minimal  Speech:  Clear and Coherent  Volume:  Normal  Mood:  Depressed, Hopeless and Worthless  Affect:  Blunt  Thought Process:  Disorganized  Orientation:  Full (Time, Place, and Person)  Thought Content:  Delusions, Paranoid Ideation and Rumination  Suicidal Thoughts:  No  Homicidal Thoughts:  No  Memory:  Immediate;   Fair Recent;   Fair Remote;   Fair  Judgement:  Poor  Insight:  Lacking  Psychomotor  Activity:  Psychomotor Retardation  Concentration:  Poor  Recall:  Poor  Fund of Knowledge:Poor  Language: Fair  Akathisia:  No  Handed:  Right  AIMS (if indicated):     Assets:  Communication Skills Desire for Improvement Housing Physical Health Resilience Social Support  ADL's:  Intact  Cognition: WNL  Sleep:  Number of Hours: 6   Treatment Plan Summary: Daily contact with patient to assess and evaluate symptoms and progress in treatment and Medication management   Mr. [name] is a 45 year old male with a history of delusional disorder admitted for worsening of depression, anxiety and aborted suicide attempt.  1. Suicidal ideation. The patient is able to contract for safety in the [facility].  2. Psychosis. He was started on Invega last night but disliked it. We switched to Haldol and increased dose to 10 mg nightly for psychosis and started Prozac for depression.  3. History of AVM. He was diagnosed years ago, prior to onset of psychosis. He has not been compliant with his follow-up appointments. I spoke briefly with Dr. Thad Rangereynolds, neurology, who would consider MRI when patient lass anxious/psychotic.  4. Diagnostic clarification. We'll order an MMPI and psychological consults. He is still struggling to complete the test.  5.  Anxiety. We will offer hydroxyzine for anxiety.  6. Hypertension. Medicine consult is greatly appreciated. Lopressor and Hydralazine were added to his regimen of hydrochlorothiazide, lisinopril, and Metoprolol. Blood pressure is an acceptable range today.  7. Metabolic syndrome screening. The patient is obese. His triglycerides are slightly elevated, TSH is normal. Hemoglobin A1c 6.3, prolactin 21.1.  8. Alcohol abuse. The patient has been drinking more lately, 2-3 beers a day. There are no symptoms of alcohol withdrawal.   9. Substance abuse treatment. The patient is a daily marijuana smoker. He minimizes his problems and declines treatment.   10. Smoking. Nicotine patch is available.  11. Insomnia. We started trazodone.   12. Daytime somnolence. Possibly from sleep apnea. Will consider Provigil.   13. Disposition. He will be discharged to home. He will follow-up with a new psychiatrist.    Kristine LineaJolanta Kester Stimpson, MD 02/02/2016, 12:05 PM

## 2016-02-02 NOTE — Consult Note (Addendum)
[facility] - [place] at [facility]                                                                                                                                                                                            Patient Demographics   Timothy Jackson, is a 45 y.o. male, DOB - [birth year], ZOX:[id number]  Admit date - 01/28/2016   Admitting Physician No admitting provider for patient encounter.  Outpatient Primary MD for the patient is No PCP Per Patient   LOS - 5  Subjective: Doing better blood pressure improving but still high    Review of Systems:   CONSTITUTIONAL: No documented fever. No fatigue, weakness. No weight gain, no weight loss.  EYES: No blurry or double vision.  ENT: No tinnitus. No postnasal drip. No redness of the oropharynx.  RESPIRATORY: No cough, no wheeze, no hemoptysis. No dyspnea.  CARDIOVASCULAR: No chest pain. No orthopnea. No palpitations. No syncope.  GASTROINTESTINAL: No nausea, no vomiting or diarrhea. No abdominal pain. No melena or hematochezia.  GENITOURINARY: No dysuria or hematuria.  ENDOCRINE: No polyuria or nocturia. No heat or cold intolerance.  HEMATOLOGY: No anemia. No bruising. No bleeding.  INTEGUMENTARY: No rashes. No lesions.  MUSCULOSKELETAL: No arthritis. No swelling. No gout.  NEUROLOGIC: No numbness, tingling, or ataxia. No seizure-type activity.  PSYCHIATRIC: Depressed   Vitals:   Filed Vitals:   02/01/16 2100 02/01/16 2120 02/02/16 0659 02/02/16 1044  BP: 173/107 158/94 162/113 146/100  Pulse: 68  63 62  Temp:      TempSrc:      Resp: 18  20   Height:      Weight:      SpO2:        Wt Readings from Last 3 Encounters:  01/28/16 180.078 kg (397 lb)  01/28/16 142.883 kg (315 lb)  05/16/13 123.378 kg (272 lb)     Intake/Output Summary (Last 24 hours) at 02/02/16 1424 Last data filed at 02/02/16 1245  Gross per 24 hour  Intake    240 ml  Output      0 ml  Net    240 ml    Physical  Exam:   GENERAL: Pleasant-appearing in no apparent distress.  HEAD, EYES, EARS, NOSE AND THROAT: Atraumatic, normocephalic. Extraocular muscles are intact. Pupils equal and reactive to light. Sclerae anicteric. No conjunctival injection. No oro-pharyngeal erythema.  NECK: Supple. There is no jugular venous distention. No bruits, no lymphadenopathy, no thyromegaly.  HEART: Regular rate and rhythm,. No murmurs, no rubs, no clicks.  LUNGS: Clear to auscultation bilaterally. No rales or rhonchi. No wheezes.  ABDOMEN: Soft, flat, nontender, nondistended. Has good bowel sounds. No hepatosplenomegaly appreciated.  EXTREMITIES: No evidence of any cyanosis, clubbing, or peripheral edema.  +2 pedal and radial pulses bilaterally.  NEUROLOGIC: The patient is alert, awake, and oriented x3 with no focal motor or sensory deficits appreciated bilaterally.  SKIN: Moist and warm with no rashes appreciated.  Psych: Depressed affect LN: No inguinal LN enlargement    Antibiotics   Anti-infectives    None      Medications   Scheduled Meds: . [START ON 02/03/2016] aspirin EC  81 mg Oral Q breakfast  . haloperidol  10 mg Oral QHS  . hydrALAZINE  100 mg Oral Q8H  . [START ON 02/03/2016] hydrochlorothiazide  50 mg Oral Q breakfast  . [START ON 02/03/2016] isosorbide mononitrate  60 mg Oral Q breakfast  . [START ON 02/03/2016] lisinopril  40 mg Oral Q breakfast  . metoprolol tartrate  100 mg Oral BID AC & HS  . traZODone  100 mg Oral QHS   Continuous Infusions:  PRN Meds:.acetaminophen, alum & mag hydroxide-simeth, cloNIDine, hydrOXYzine, magnesium hydroxide   Data Review:   Micro Results No results found for this or any previous visit (from the past 240 hour(s)).  Radiology Reports No results found.   CBC  Recent Labs Lab 01/28/16 1414  WBC 6.4  HGB 14.6  HCT 44.5  PLT 291  MCV 81.2  MCH 26.6  MCHC 32.8  RDW 14.9*    Chemistries   Recent Labs Lab 01/28/16 1414  NA 137  K 3.9  CL 101   CO2 25  GLUCOSE 118*  BUN 10  CREATININE 1.12  CALCIUM 9.3  AST 23  ALT 30  ALKPHOS 56  BILITOT 0.5   ------------------------------------------------------------------------------------------------------------------ estimated creatinine clearance is 132 mL/min (by C-G formula based on Cr of 1.12). ------------------------------------------------------------------------------------------------------------------ No results for input(s): HGBA1C in the last 72 hours. ------------------------------------------------------------------------------------------------------------------ No results for input(s): CHOL, HDL, LDLCALC, TRIG, CHOLHDL, LDLDIRECT in the last 72 hours. ------------------------------------------------------------------------------------------------------------------ No results for input(s): TSH, T4TOTAL, T3FREE, THYROIDAB in the last 72 hours.  Invalid input(s): FREET3 ------------------------------------------------------------------------------------------------------------------ No results for input(s): VITAMINB12, FOLATE, FERRITIN, TIBC, IRON, RETICCTPCT in the last 72 hours.  Coagulation profile No results for input(s): INR, PROTIME in the last 168 hours.  No results for input(s): DDIMER in the last 72 hours.  Cardiac Enzymes No results for input(s): CKMB, TROPONINI, MYOGLOBIN in the last 168 hours.  Invalid input(s): CK ------------------------------------------------------------------------------------------------------------------ Invalid input(s): POCBNP    Assessment & Plan   45 y/o male with hx of AVM and HTN (not taking medications for over 3 years) with accelerated HTN and likely rebound HTN from clonidine.  1. Accelerated HTN blood pressure still under poor control.But improved compared to previously Blood pressure is not under control. Increase hydralazine dose continue clonidine isosorbide mononitrate lisinopril and metoprolol. As well as  hydrochlorothiazide  2. Hx of AVM: Asymptomatic  3. Tobacco dependence:  Recommended to stop  4. Psychosis with SI: Management per Psychiatry  5. Morbid obesity with likely OSA: Encouraged weight loss and needs outpatient sleep evaluation.  6. EtOh abuse: No evidence of withdrawal     Code Status Orders        Start     Ordered   01/28/16 1849  Full code   Continuous     01/28/16 1848    Code Status History    Date Active Date Inactive Code Status Order ID Comments User Context   05/15/2013  8:26 PM 05/17/2013  1:38 AM Full Code [id number]  Vida RollerBrian D Miller, MD ED             DVT Prophylaxis  Ambulatory  Lab Results  Component Value Date   PLT 291 01/28/2016     Time Spent in minutes 32min  Greater than 50% of time spent in care coordination and counseling patient regarding the condition and plan of care.   Auburn BilberryPATEL, Tanvi Gatling M.D on 02/02/2016 at 2:24 PM  Between 7am to 6pm - Pager - [phone]  After 6pm go to [email or web address] - password [email or web address] [facility]  [facility]   Office  [phone]

## 2016-02-02 NOTE — Consult Note (Signed)
  Mr. Timothy Jackson has not yet completed the MMPI-2. He states that he is still working on it. He has completed about 5 columns. The length of time he is taking to complete the test questions by itself suggests the possibility of invalidity.

## 2016-02-02 NOTE — [facility] Group Notes (Signed)
[facility] Group Notes:  (Nursing/MHT/Case Management/Adjunct)  Date:  02/02/2016  Time:  4:44 PM  Type of Therapy:  Psychoeducational Skills  Participation Level:  Did Not Attend  Participation Quality:Summary of Progress/Problems:  Mayra NeerJackie L Theotis Gerdeman 02/02/2016, 4:44 PM

## 2016-02-02 NOTE — Plan of Care (Signed)
Problem: Alteration in mood Goal: LTG-Patient reports reduction in suicidal thoughts (Patient reports reduction in suicidal thoughts and is able to verbalize a safety plan for whenever patient is feeling suicidal)  Outcome: Progressing Patient denies thoughts of self harm. Pleasant and motivated for treatment

## 2016-02-02 NOTE — Progress Notes (Signed)
Patient stayed in room most of the time.Tried to attend groups but he came back after few minutes.States "I can not be around people when they are talking."Stated that he is anxious but he does not know why.Appropriate with staff & peers.Compliant with medications.

## 2016-02-02 NOTE — Plan of Care (Signed)
Problem: Alteration in thought process Goal: LTG-Patient behavior demonstrates decreased signs psychosis (Patient behavior demonstrates decreased signs of psychosis to the point the patient is safe to return home and continue treatment in an outpatient setting.)  Outcome: Progressing No inappropriate behaviors noted.

## 2016-02-02 NOTE — [facility] Group Notes (Signed)
[facility] LCSW Group Therapy  02/02/2016 3:02 PM  Type of Therapy:  Group Therapy  Participation Level:  Did Not Attend  Summary of Progress/Problems: Patient was called to group but did not attend.   Lulu RidingIngle, Atha Muradyan T, MSW, LCSW 02/02/2016, 3:02 PM

## 2016-02-02 NOTE — Progress Notes (Signed)
D: Pt denies SI/AVH. Guarded but pleasant. Medication compliant. Visible in milieu with minimal interaction. Denies any pain.  A: Encouragement and support provided. Q15 minute checks maintained for safety. Medications given as prescribed. R: Remains safe on unit and voices no additional concerns at this time.

## 2016-02-02 NOTE — Plan of Care (Signed)
Problem: Alteration in thought process Goal: LTG-Patient behavior demonstrates decreased signs psychosis (Patient behavior demonstrates decreased signs of psychosis to the point the patient is safe to return home and continue treatment in an outpatient setting.)  Outcome: Progressing Patient presently denies hallucinations. Thought process is organized.

## 2016-02-02 NOTE — Progress Notes (Signed)
2030: patient visible in the milieu. Alert and oriented: appears to be a little anxious but denies. Denies hallucinations, denies SI/HI. Support and encouragements offered. Safety maintained on the unit. 2200: Patient in room resting. More pleasant and approachable. Presented to the medication room, received medications as prescribed. Did not want a snack. Emotional support offered. Staff continue to monitor.  0300: Patient has been sleeping soundly. Staff continue to monitor. 0500: Patient has remained asleep in his bed. Staff continue to monitor for safety.

## 2016-02-03 NOTE — Consult Note (Signed)
[facility] - [place] at [facility]                                                                                                                                                                                            Patient Demographics   Kratos Ruscitti, is a 45 y.o. male, DOB - 02/07/1971, ZOX:[id number]  Admit date - 01/28/2016   Admitting Physician No admitting provider for patient encounter.  Outpatient Primary MD for the patient is No PCP Per Patient   LOS - 6  Subjective: Doing better blood pressure improving but still high    Review of Systems:   CONSTITUTIONAL: No documented fever. No fatigue, weakness. No weight gain, no weight loss.  EYES: No blurry or double vision.  ENT: No tinnitus. No postnasal drip. No redness of the oropharynx.  RESPIRATORY: No cough, no wheeze, no hemoptysis. No dyspnea.  CARDIOVASCULAR: No chest pain. No orthopnea. No palpitations. No syncope.  GASTROINTESTINAL: No nausea, no vomiting or diarrhea. No abdominal pain. No melena or hematochezia.  GENITOURINARY: No dysuria or hematuria.  ENDOCRINE: No polyuria or nocturia. No heat or cold intolerance.  HEMATOLOGY: No anemia. No bruising. No bleeding.  INTEGUMENTARY: No rashes. No lesions.  MUSCULOSKELETAL: No arthritis. No swelling. No gout.  NEUROLOGIC: No numbness, tingling, or ataxia. No seizure-type activity.  PSYCHIATRIC: Depressed   Vitals:   Filed Vitals:   02/02/16 1044 02/02/16 1900 02/03/16 0659 02/03/16 0910  BP: 146/100 160/94 163/109 150/85  Pulse: 62 72 59   Temp:      TempSrc:      Resp:   20   Height:      Weight:      SpO2:        Wt Readings from Last 3 Encounters:  01/28/16 180.078 kg (397 lb)  01/28/16 142.883 kg (315 lb)  05/16/13 123.378 kg (272 lb)     Intake/Output Summary (Last 24 hours) at 02/03/16 1256 Last data filed at 02/03/16 0846  Gross per 24 hour  Intake    238 ml  Output      0 ml  Net    238 ml    Physical Exam:    GENERAL: Pleasant-appearing in no apparent distress.  HEAD, EYES, EARS, NOSE AND THROAT: Atraumatic, normocephalic. Extraocular muscles are intact. Pupils equal and reactive to light. Sclerae anicteric. No conjunctival injection. No oro-pharyngeal erythema.  NECK: Supple. There is no jugular venous distention. No bruits, no lymphadenopathy, no thyromegaly.  HEART: Regular rate and rhythm,. No murmurs, no rubs, no clicks.  LUNGS: Clear to auscultation bilaterally. No rales or rhonchi. No wheezes.  ABDOMEN: Soft, flat, nontender, nondistended. Has good bowel sounds. No hepatosplenomegaly appreciated.  EXTREMITIES: No evidence of any cyanosis, clubbing, or peripheral edema.  +2 pedal and radial pulses bilaterally.  NEUROLOGIC: The patient is alert, awake, and oriented x3 with no focal motor or sensory deficits appreciated bilaterally.  SKIN: Moist and warm with no rashes appreciated.  Psych: Depressed affect LN: No inguinal LN enlargement    Antibiotics   Anti-infectives    None      Medications   Scheduled Meds: . aspirin EC  81 mg Oral Q breakfast  . haloperidol  10 mg Oral QHS  . hydrALAZINE  100 mg Oral Q8H  . hydrochlorothiazide  50 mg Oral Q breakfast  . isosorbide mononitrate  60 mg Oral Q breakfast  . lisinopril  40 mg Oral Q breakfast  . metoprolol tartrate  100 mg Oral BID AC & HS  . traZODone  100 mg Oral QHS   Continuous Infusions:  PRN Meds:.acetaminophen, alum & mag hydroxide-simeth, cloNIDine, hydrOXYzine, magnesium hydroxide   Data Review:   Micro Results No results found for this or any previous visit (from the past 240 hour(s)).  Radiology Reports No results found.   CBC  Recent Labs Lab 01/28/16 1414  WBC 6.4  HGB 14.6  HCT 44.5  PLT 291  MCV 81.2  MCH 26.6  MCHC 32.8  RDW 14.9*    Chemistries   Recent Labs Lab 01/28/16 1414  NA 137  K 3.9  CL 101  CO2 25  GLUCOSE 118*  BUN 10  CREATININE 1.12  CALCIUM 9.3  AST 23  ALT 30   ALKPHOS 56  BILITOT 0.5   ------------------------------------------------------------------------------------------------------------------ estimated creatinine clearance is 132 mL/min (by C-G formula based on Cr of 1.12). ------------------------------------------------------------------------------------------------------------------ No results for input(s): HGBA1C in the last 72 hours. ------------------------------------------------------------------------------------------------------------------ No results for input(s): CHOL, HDL, LDLCALC, TRIG, CHOLHDL, LDLDIRECT in the last 72 hours. ------------------------------------------------------------------------------------------------------------------ No results for input(s): TSH, T4TOTAL, T3FREE, THYROIDAB in the last 72 hours.  Invalid input(s): FREET3 ------------------------------------------------------------------------------------------------------------------ No results for input(s): VITAMINB12, FOLATE, FERRITIN, TIBC, IRON, RETICCTPCT in the last 72 hours.  Coagulation profile No results for input(s): INR, PROTIME in the last 168 hours.  No results for input(s): DDIMER in the last 72 hours.  Cardiac Enzymes No results for input(s): CKMB, TROPONINI, MYOGLOBIN in the last 168 hours.  Invalid input(s): CK ------------------------------------------------------------------------------------------------------------------ Invalid input(s): POCBNP    Assessment & Plan   45 y/o male with hx of AVM and HTN (not taking medications for over 3 years) with accelerated HTN and likely rebound HTN from clonidine.  1. Accelerated HTNBlood pressures improved continue current therapy I do not expect patient's blood pressure to completely normalize.  2. Hx of AVM: Asymptomatic  3. Tobacco dependence:  Recommended to stop  4. Psychosis with SI: Management per Psychiatry  5. Morbid obesity with likely OSA: Encouraged weight loss and  needs outpatient sleep evaluation.  6. EtOh abuse: No evidence of withdrawal     Code Status Orders        Start     Ordered   01/28/16 1849  Full code   Continuous     01/28/16 1848    Code Status History    Date Active Date Inactive Code Status Order ID Comments User Context   05/15/2013  8:26 PM 05/17/2013  1:38 AM Full Code [id number]  Vida RollerBrian D Miller, MD ED             DVT Prophylaxis  Ambulatory  Lab Results  Component Value Date   PLT 291 01/28/2016     Time Spent in minutes35min  Greater than 50% of time spent in care coordination and counseling patient regarding the condition and plan of care.   Auburn Bilberry M.D on 02/03/2016 at 12:56 PM  Between 7am to 6pm - Pager - [phone]  After 6pm go to [email or web address] - password [email or web address] [facility]  [facility] [employer] Hospital[facility]   Office  [phone]

## 2016-02-03 NOTE — Progress Notes (Signed)
[facility] MD Progress Note  02/03/2016 1:54 PM Timothy Jackson  MRN:  [id number]  Subjective:  Timothy Jackson is still very paranoid, delusional and disorganized. He met with the treatment team today and was able to talk about his worries, being suspicious of other people trying to hurt him physically or financially. He feels that his family members are trying to avoid his relationship with his 46 year old son. He does not trust anybody on the unit either. She now accepts medications and seems to tolerate them well. There is no group participation as of yet. Most of the day his withdrawal to his room. He sleeps a lot. He feels depressed and has passive suicidal thoughts. At this point he does not believe he can face the reality outside of the hospital.  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 HTN (hypertension) [I10] 01/29/2016  . 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 Cannabis use disorder, moderate, dependence ([place]) [F12.20] 01/28/2016  . Alcohol use disorder, moderate, dependence ([place]) [F10.20] 01/28/2016  . Suicidal ideation [R45.851] 01/28/2016  . Tobacco use disorder [F17.200] 01/28/2016  . Generalized anxiety disorder [F41.1] 05/18/2013   Total Time spent with patient: 20 minutes  Past Psychiatric History: Depression, psychosis.  Past Medical History:  Past Medical History  Diagnosis Date  . Hypertension   . Deaf     Right ear  . History of cardiac cath 02/24/2010     LVH, EF of 50-55%, LAD has 10-15% proximal stenosis, by Dr. Terrence Dupont  . Anxiety, generalized   . Erectile dysfunction   . Delusional disorder ([place])   . 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  Cardiac catheterization  01/2010    "essentially negative"  . Fracture surgery Left     ORIF left arm as a child   Family History:  Family History  Problem Relation Age of Onset  . Hypertension Father   . Coronary artery disease Maternal Grandmother   . Hypertension Maternal Grandmother   . Diabetes Maternal Grandmother   . Coronary artery disease Maternal Uncle   . Hypertension Maternal Uncle   . Diabetes Maternal Aunt   . Leukemia Maternal Uncle    Family Psychiatric  History: None reported. Social History:  History  Alcohol Use  . 0.0 oz/week    Comment: daily     History  Drug Use  . Yes  . Special: Marijuana    Comment: Occassional marijuana use    Social History   Social History  . Marital Status: Single    Spouse Name: N/A  . Number of Children: N/A  . Years of Education: N/A   Occupational History  . desk job     Sports administrator   Social History Main Topics  . Smoking status: Current Some Day Smoker    Types: Cigarettes  . Smokeless tobacco: None  . Alcohol Use: 0.0 oz/week     Comment: daily  . Drug Use: Yes    Special: Marijuana     Comment: Occassional marijuana use  . Sexual Activity: Not Currently   Other Topics Concern  . None   Social History Narrative   Additional Social History:  Sleep: Fair  Appetite:  Fair  Current Medications: Current Facility-Administered Medications  Medication Dose Route Frequency Provider Last Rate Last Dose  . acetaminophen (TYLENOL) tablet 650 mg  650 mg Oral Q6H PRN Gonzella Lex, MD      . alum & mag hydroxide-simeth (MAALOX/MYLANTA) 200-200-20 MG/5ML suspension 30 mL  30 mL Oral Q4H PRN Gonzella Lex, MD      . aspirin EC tablet 81 mg  81 mg Oral Q breakfast [place] Gudiel B Parveen Freehling, MD   81 mg at 02/03/16 0912  . cloNIDine (CATAPRES) tablet 0.1 mg  0.1 mg Oral Q6H PRN Dustin Flock, MD      . haloperidol (HALDOL) tablet 10 mg  10 mg Oral QHS Chin Wachter B Morey Andonian, MD    10 mg at 02/02/16 2212  . hydrALAZINE (APRESOLINE) tablet 100 mg  100 mg Oral Q8H Shreyang Patel, MD   100 mg at 02/03/16 1328  . hydrochlorothiazide (HYDRODIURIL) tablet 50 mg  50 mg Oral Q breakfast Leviathan Macera B Ronney Honeywell, MD   50 mg at 02/03/16 0911  . hydrOXYzine (ATARAX/VISTARIL) tablet 50 mg  50 mg Oral TID PRN Clovis Fredrickson, MD   50 mg at 02/01/16 2119  . isosorbide mononitrate (IMDUR) 24 hr tablet 60 mg  60 mg Oral Q breakfast Manasi Dishon B Alam Guterrez, MD   60 mg at 02/03/16 0912  . lisinopril (PRINIVIL,ZESTRIL) tablet 40 mg  40 mg Oral Q breakfast Tomoki Lucken B Tishia Maestre, MD   40 mg at 02/03/16 0910  . magnesium hydroxide (MILK OF MAGNESIA) suspension 30 mL  30 mL Oral Daily PRN Gonzella Lex, MD   30 mL at 01/31/16 2153  . metoprolol tartrate (LOPRESSOR) tablet 100 mg  100 mg Oral BID AC & HS Caelynn Marshman B Derry Arbogast, MD   100 mg at 02/03/16 0911  . traZODone (DESYREL) tablet 100 mg  100 mg Oral QHS Clovis Fredrickson, MD   100 mg at 02/02/16 2212    Lab Results: No results found for this or any previous visit (from the past 32 hour(s)).  Blood Alcohol level:  Lab Results  Component Value Date   [facility] <5 01/28/2016   ETH <11 06/01/2013    Physical Findings: AIMS: Facial and Oral Movements Muscles of Facial Expression: None, normal Lips and Perioral Area: None, normal Jaw: None, normal Tongue: None, normal,Extremity Movements Upper (arms, wrists, hands, fingers): None, normal Lower (legs, knees, ankles, toes): None, normal, Trunk Movements Neck, shoulders, hips: None, normal, Overall Severity Severity of abnormal movements (highest score from questions above): None, normal Incapacitation due to abnormal movements: None, normal Patient's awareness of abnormal movements (rate only patient's report): No Awareness, Dental Status Current problems with teeth and/or dentures?: No Does patient usually wear dentures?: No  CIWA:    COWS:     Musculoskeletal: Strength & Muscle Tone:  within normal limits Gait & Station: normal Patient leans: N/A  Psychiatric Specialty Exam: Review of Systems  Psychiatric/Behavioral: Positive for depression and hallucinations.  All other systems reviewed and are negative.   Blood pressure 150/85, pulse 59, temperature 98.5 F (36.9 C), temperature source Oral, resp. rate 20, height _0  (1.753 m), weight 180.078 kg (397 lb), SpO2 98 %.Body mass index is 58.6 kg/(m^2).  General Appearance: Fairly Groomed  Engineer, water::  Fair  Speech:  Clear and Coherent  Volume:  Normal  Mood:  Dysphoric  Affect:  Blunt  Thought Process:  Disorganized  Orientation:  Full (Time, Place, and Person)  Thought Content:  Delusions, Hallucinations: Auditory and Paranoid Ideation  Suicidal Thoughts:  Yes.  with intent/plan  Homicidal Thoughts:  No  Memory:  Immediate;   Fair Recent;   Fair Remote;   Fair  Judgement:  Poor  Insight:  Lacking  Psychomotor Activity:  Decreased  Concentration:  Fair  Recall:  [employer] of Knowledge:Fair  Language: Fair  Akathisia:  No  Handed:  Right  AIMS (if indicated):     Assets:  Communication Skills Desire for Improvement Housing Physical Health Resilience Social Support  ADL's:  Intact  Cognition: WNL  Sleep:  Number of Hours: 6.3   Treatment Plan Summary: Daily contact with patient to assess and evaluate symptoms and progress in treatment and Medication management   Timothy Jackson is a 45 year old male with a history of delusional disorder admitted for worsening of depression, anxiety and aborted suicide attempt.  1. Suicidal ideation. The patient is able to contract for safety in the hospital.  2. Psychosis. He was started on [place] but disliked it. We switched to Haldol and increased dose to 10 mg nightly for psychosis and started Prozac for depression.  3. History of AVM. He was diagnosed years ago, prior to onset of psychosis. He has not been compliant with his follow-up appointments. I spoke  briefly with Dr. Doy Mince, neurology, who would consider MRI when patient lass anxious/psychotic.  4. Diagnostic clarification. We'll order an MMPI and psychological consults. He is still struggling to complete the test.  5. Anxiety. We will offer hydroxyzine for anxiety.  6. Hypertension. Medicine consult is greatly appreciated. Lopressor and Hydralazine were added to his regimen of hydrochlorothiazide, lisinopril, and Metoprolol. Blood pressure is an acceptable range today.  7. Metabolic syndrome screening. The patient is obese. His triglycerides are slightly elevated, TSH is normal. Hemoglobin A1c 6.3, prolactin 21.1.  8. Alcohol abuse. The patient has been drinking more lately, 2-3 beers a day. There are no symptoms of alcohol withdrawal.   9. Substance abuse treatment. The patient is a daily marijuana smoker. He minimizes his problems and declines treatment.   10. Smoking. Nicotine patch is available.  11. Insomnia. We started trazodone.   12. Daytime somnolence. Possibly from sleep apnea. Will consider Provigil.   13. Disposition. He will be discharged to home. He will follow-up with a new psychiatrist.   Orson Slick, MD 02/03/2016, 1:54 PM
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
## 2016-02-03 NOTE — Progress Notes (Signed)
D:  Pt denies SI/HI. Denies pain. Pt stated he wants to remain alone in his room because "being out there"  elevates his anxiety. Pt feels depressed (7/10). Mild anxiety, mostly when in the milieu.  Compliant with medications.    A: Pt given support and encouragement to participate in groups. Medications administered as ordered. 15 minute checks in progress to keep pt safe.  R: Pt remains safe on the unit. Pt upset with commotion in dayroom. "It was loud down there today."  Pt stayed in room today.

## 2016-02-03 NOTE — [facility] Group Notes (Signed)
[facility] LCSW Group Therapy  02/03/2016 3:59 PM  Type of Therapy:  Group Therapy  Participation Level:  Did Not Attend  Summary of Progress/Problems: Patient was called to group but did not attend.   Reiley Bertagnolli T, MSW, LCSW 02/03/2016, 3:59 PM 

## 2016-02-03 NOTE — [facility] Group Notes (Signed)
[facility] LCSW Aftercare Discharge Planning Group Note   02/03/2016 11:28 AM  Participation Quality:  Patient was called to group but did not attend.    Miller Edgington T, MSW, LCSW  

## 2016-02-03 NOTE — Progress Notes (Signed)
Recreation Therapy Notes  Date: 05.09.17 Time: 9:30 am Location: Craft Room  Group Topic: Coping skills  Goal Area(s) Addresses:  Patient will effectively use art as a coping skill. Patient will be able to identify one emotion experienced during group.  Behavioral Response: Did not attend  Intervention: Two Faces of Me  Activity: Patients were given a blank face worksheet and instructed to draw or write how they felt when they were admitted to the hospital on one side of the face and draw or write how they wanted to feel when they are d/c on the other side of the face.  Education: LRT educated patients on healthy coping skills.  Education Outcome: Patient did not attend group.  Clinical Observations/Feedback: Patient did not attend group.   Jacquelynn CreeGreene,Sharon Rubis M, LRT/CTRS 02/03/2016 10:19 AM

## 2016-02-03 NOTE — [facility] Group Notes (Signed)
[facility] Group Notes:  (Nursing/MHT/Case Management/Adjunct)  Date:  02/03/2016  Time:  2:26 PM  Type of Therapy:  Psychoeducational Skills  Participation Level:  Did Not Attend    [name] 02/03/2016, 2:26 PM

## 2016-02-04 MED ORDER — LORAZEPAM 2 MG PO TABS
2.0000 mg | ORAL_TABLET | ORAL | Status: DC | PRN
  Administered 2016-02-04 – 2016-02-08 (×4): 2 mg via ORAL
  Filled 2016-02-04 (×4): qty 1

## 2016-02-04 MED ORDER — OLANZAPINE 10 MG PO TABS
5.0000 mg | ORAL_TABLET | Freq: Once | ORAL | Status: AC
  Administered 2016-02-04: 5 mg via ORAL
  Filled 2016-02-04: qty 1

## 2016-02-04 MED ORDER — ISOSORBIDE MONONITRATE ER 30 MG PO TB24
120.0000 mg | ORAL_TABLET | Freq: Every day | ORAL | Status: DC
  Administered 2016-02-05 – 2016-02-12 (×8): 120 mg via ORAL
  Filled 2016-02-04 (×8): qty 4

## 2016-02-04 NOTE — Plan of Care (Signed)
Problem: Ineffective individual coping Goal: STG: Patient will remain free from self harm Outcome: Progressing Medications administered as ordered by the physician, medications Therapeutic Effects, SEs and Adverse effects discussed, questions encouraged; no PRN given, 15 minute checks maintained for safety, clinical and moral support provided, patient encouraged to continue to express feelings and demonstrate safe care. Patient remain free from harm, will continue to monitor.         

## 2016-02-04 NOTE — Consult Note (Signed)
[facility] - [place] at [facility]                                                                                                                                                                                            Patient Demographics   Timothy Jackson, is a 45 y.o. male, DOB - [birth year], ZOX:[id number]  Admit date - 01/28/2016   Admitting Physician No admitting provider for patient encounter.  Outpatient Primary MD for the patient is No PCP Per Patient   LOS - 7  Subjective: Pt feels little anxious, bp improved, he is more interactive    Review of Systems:   CONSTITUTIONAL: No documented fever. No fatigue, weakness. No weight gain, no weight loss.  EYES: No blurry or double vision.  ENT: No tinnitus. No postnasal drip. No redness of the oropharynx.  RESPIRATORY: No cough, no wheeze, no hemoptysis. No dyspnea.  CARDIOVASCULAR: No chest pain. No orthopnea. No palpitations. No syncope.  GASTROINTESTINAL: No nausea, no vomiting or diarrhea. No abdominal pain. No melena or hematochezia.  GENITOURINARY: No dysuria or hematuria.  ENDOCRINE: No polyuria or nocturia. No heat or cold intolerance.  HEMATOLOGY: No anemia. No bruising. No bleeding.  INTEGUMENTARY: No rashes. No lesions.  MUSCULOSKELETAL: No arthritis. No swelling. No gout.  NEUROLOGIC: No numbness, tingling, or ataxia. No seizure-type activity.  PSYCHIATRIC: Depressed, anxious   Vitals:   Filed Vitals:   02/03/16 1900 02/04/16 0655 02/04/16 0700 02/04/16 0835  BP: 161/95 171/100 164/97 176/101  Pulse: 68  67 68  Temp:   97.9 F (36.6 C)   TempSrc:   Oral   Resp:      Height:      Weight:      SpO2:        Wt Readings from Last 3 Encounters:  01/28/16 180.078 kg (397 lb)  01/28/16 142.883 kg (315 lb)  05/16/13 123.378 kg (272 lb)     Intake/Output Summary (Last 24 hours) at 02/04/16 1008 Last data filed at 02/04/16 0831  Gross per 24 hour  Intake   1320 ml  Output      0 ml   Net   1320 ml    Physical Exam:   GENERAL: Pleasant-appearing in no apparent distress.  HEAD, EYES, EARS, NOSE AND THROAT: Atraumatic, normocephalic. Extraocular muscles are intact. Pupils equal and reactive to light. Sclerae anicteric. No conjunctival injection. No oro-pharyngeal erythema.  NECK: Supple. There is no jugular venous distention. No bruits, no lymphadenopathy, no thyromegaly.  HEART: Regular rate and rhythm,. No murmurs, no rubs, no clicks.  LUNGS: Clear to auscultation bilaterally. No rales or  rhonchi. No wheezes.  ABDOMEN: Soft, flat, nontender, nondistended. Has good bowel sounds. No hepatosplenomegaly appreciated.  EXTREMITIES: No evidence of any cyanosis, clubbing, or peripheral edema.  +2 pedal and radial pulses bilaterally.  NEUROLOGIC: The patient is alert, awake, and oriented x3 with no focal motor or sensory deficits appreciated bilaterally.  SKIN: Moist and warm with no rashes appreciated.  Psych: Depressed affect LN: No inguinal LN enlargement    Antibiotics   Anti-infectives    None      Medications   Scheduled Meds: . aspirin EC  81 mg Oral Q breakfast  . haloperidol  10 mg Oral QHS  . hydrALAZINE  100 mg Oral Q8H  . hydrochlorothiazide  50 mg Oral Q breakfast  . isosorbide mononitrate  60 mg Oral Q breakfast  . lisinopril  40 mg Oral Q breakfast  . metoprolol tartrate  100 mg Oral BID AC & HS  . traZODone  100 mg Oral QHS   Continuous Infusions:  PRN Meds:.acetaminophen, alum & mag hydroxide-simeth, cloNIDine, hydrOXYzine, magnesium hydroxide   Data Review:   Micro Results No results found for this or any previous visit (from the past 240 hour(s)).  Radiology Reports No results found.   CBC  Recent Labs Lab 01/28/16 1414  WBC 6.4  HGB 14.6  HCT 44.5  PLT 291  MCV 81.2  MCH 26.6  MCHC 32.8  RDW 14.9*    Chemistries   Recent Labs Lab 01/28/16 1414  NA 137  K 3.9  CL 101  CO2 25  GLUCOSE 118*  BUN 10  CREATININE  1.12  CALCIUM 9.3  AST 23  ALT 30  ALKPHOS 56  BILITOT 0.5   ------------------------------------------------------------------------------------------------------------------ estimated creatinine clearance is 132 mL/min (by C-G formula based on Cr of 1.12). ------------------------------------------------------------------------------------------------------------------ No results for input(s): HGBA1C in the last 72 hours. ------------------------------------------------------------------------------------------------------------------ No results for input(s): CHOL, HDL, LDLCALC, TRIG, CHOLHDL, LDLDIRECT in the last 72 hours. ------------------------------------------------------------------------------------------------------------------ No results for input(s): TSH, T4TOTAL, T3FREE, THYROIDAB in the last 72 hours.  Invalid input(s): FREET3 ------------------------------------------------------------------------------------------------------------------ No results for input(s): VITAMINB12, FOLATE, FERRITIN, TIBC, IRON, RETICCTPCT in the last 72 hours.  Coagulation profile No results for input(s): INR, PROTIME in the last 168 hours.  No results for input(s): DDIMER in the last 72 hours.  Cardiac Enzymes No results for input(s): CKMB, TROPONINI, MYOGLOBIN in the last 168 hours.  Invalid input(s): CK ------------------------------------------------------------------------------------------------------------------ Invalid input(s): POCBNP    Assessment & Plan   45 y/o male with hx of AVM and HTN (not taking medications for over 3 years) with accelerated HTN and likely rebound HTN from clonidine.  1. Accelerated HTN Blood pressure still elevated T clonidine when necessary Hydralazine 100 mg every 8 hours Hydrochlorothiazide 50 mg daily Lisinopril 40 daily Metoprolol 100 twice a day I will increase his Imdur dose  2. Hx of AVM: Asymptomatic  3. Tobacco dependence:   Recommended to stop  4. Psychosis with SI: Management per Psychiatry  5. Morbid obesity with likely OSA: Encouraged weight loss outpatient sleep study  6. EtOh abuse: No evidence of withdrawal     Code Status Orders        Start     Ordered   01/28/16 1849  Full code   Continuous     01/28/16 1848    Code Status History    Date Active Date Inactive Code Status Order ID Comments User Context   05/15/2013  8:26 PM 05/17/2013  1:38 AM Full Code [id number]  Oris Drone  Hyacinth MeekerMiller, MD ED             DVT Prophylaxis  Ambulatory  Lab Results  Component Value Date   PLT 291 01/28/2016     Time Spent in minutes5825min  Greater than 50% of time spent in care coordination and counseling patient regarding the condition and plan of care.   Auburn BilberryPATEL, Seyed Heffley M.D on 02/04/2016 at 10:08 AM  Between 7am to 6pm - Pager - [phone]  After 6pm go to [email or web address] - password [email or web address] [facility]  [facility]   Office  [phone]

## 2016-02-04 NOTE — [facility] Group Notes (Signed)
[facility] Group Notes:  (Nursing/MHT/Case Management/Adjunct)  Date:  02/04/2016  Time:  3:43 PM  Type of Therapy:  Psychoeducational Skills  Participation Level:  Minimal  Participation Quality:  Appropriate and Attentive  Affect:  Flat  Cognitive:  Appropriate  Insight:  Improving  Engagement in Group:  Limited  Modes of Intervention:  Discussion and Education  Summary of Progress/Problems:  Timothy Jackson 02/04/2016, 3:43 PM

## 2016-02-04 NOTE — [facility] Group Notes (Signed)
[facility] LCSW Group Therapy  02/04/2016 2:30 PM  Type of Therapy:  Group Therapy   Summary of Progress/Problems:  Patient attended and participated in group and was attentive during the movie shown "The Happy Movie" which is a documentary about what truly makes people happy and how to measure happiness. Patient was attentive throughout the group session and introduced himself sharing that from the movie he realized that "happiness is a choice and we can choose to do things that change our happiness".   Lulu RidingIngle, Charise Leinbach T, MSW, LCSW 02/04/2016, 2:30 PM

## 2016-02-04 NOTE — [facility] Group Notes (Signed)
[facility] LCSW Group Therapy  02/04/2016 11:08 AM  Type of Therapy:  Group Therapy  Participation Level:  Did Not Attend  Summary of Progress/Problems: Patient was called but did not attend morning group.   Lulu RidingIngle, Ayala Ribble T, MSW, LCSW 02/04/2016, 11:08 AM

## 2016-02-04 NOTE — Progress Notes (Signed)
D: Patient is alert and oriented,paranoid on the unit this shift. Patient does not attend and actively participate in groups today. Patient denies suicidal ideation, homicidal ideation, auditory or visual hallucinations at the present time.  A: Scheduled medications are administered to patient as per MD orders. Emotional support and encouragement are provided. Patient is maintained on q.15 minute safety checks. Patient is informed to notify staff with questions or concerns. R: No adverse medication reactions are noted. Patient is cooperative with medication administration and treatment plan today. Patient is receptive,paranoid, calm and cooperative on the unit at this time. Patient interacts well with others on the unit this shift. Patient contracts for safety at this time. Patient remains safe at this time.

## 2016-02-04 NOTE — Plan of Care (Signed)
Problem: Consults Goal: Depression Patient Education See Patient Education Module for education specifics.  Outcome: Progressing pateint progressing towards knowing what he feels his deprssion scale is at this point [employer] RN

## 2016-02-04 NOTE — Progress Notes (Addendum)
[facility]  02/04/2016 12:32 PM Lillie ColumbiaOtis W Deeg  MRN:  [id number]  Subjective:  Mr. [name] seems slightly better today. For the first time he has not been sleeping but sits on the chair in the middle of the room trying to talk to security guard. The patient believes his room for meals only and has not been participating in any activities on the unit as of yet. He now takes medications as prescribed and seems to tolerate them well. He is able to engage in the belief consults conversation but almost immediately he steers towards his delusions. Sleep and appetite are fair. There are no somatic complaints.  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 HTN (hypertension) [I10] 01/29/2016  . 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 Cannabis use disorder, moderate, dependence (HCC) [F12.20] 01/28/2016  . Alcohol use disorder, moderate, dependence (HCC) [F10.20] 01/28/2016  . Suicidal ideation [R45.851] 01/28/2016  . Tobacco use disorder [F17.200] 01/28/2016  . Generalized anxiety disorder [F41.1] 05/18/2013   Total Time spent with patient: 20 minutes  Past Psychiatric History: Depression, psychosis.  Past Medical History:  Past Medical History  Diagnosis Date  . Hypertension   . Deaf     Right ear  . History of cardiac cath 02/24/2010     LVH, EF of 50-55%, LAD has 10-15% proximal stenosis, by Dr. Sharyn LullHarwani  . Anxiety, generalized   . Erectile dysfunction   . Delusional disorder (HCC)   . 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 Cardiac catheterization  01/2010    "essentially negative"  . Fracture surgery Left     ORIF left arm as a child   Family History:   Family History  Problem Relation Age of Onset  . Hypertension Father   . Coronary artery disease Maternal Grandmother   . Hypertension Maternal Grandmother   . Diabetes Maternal Grandmother   . Coronary artery disease Maternal Uncle   . Hypertension Maternal Uncle   . Diabetes Maternal Aunt   . Leukemia Maternal Uncle    Family Psychiatric  History: See H&P. Social History:  History  Alcohol Use  . 0.0 oz/week    Comment: daily     History  Drug Use  . Yes  . Special: Marijuana    Comment: Occassional marijuana use    Social History   Social History  . Marital Status: Single    Spouse Name: N/A  . Number of Children: N/A  . Years of Education: N/A   Occupational History  . desk job     Scientist, [employer]   Social History Main Topics  . Smoking status: Current Some Day Smoker    Types: Cigarettes  . Smokeless tobacco: None  . Alcohol Use: 0.0 oz/week     Comment: daily  . Drug Use: Yes    Special: Marijuana     Comment: Occassional marijuana use  . Sexual Activity: Not Currently   Other Topics Concern  . None   Social History Narrative   Additional Social History:                         Sleep: Fair  Appetite:  Fair  Current Medications: Current Facility-Administered Medications  Medication Dose Route Frequency Provider Last Rate Last Dose  . acetaminophen (TYLENOL) tablet 650 mg  650 mg Oral Q6H PRN Audery Amel, MD      . alum & mag hydroxide-simeth (MAALOX/MYLANTA) 200-200-20 MG/5ML suspension 30 mL  30 mL Oral Q4H PRN Audery Amel, MD      . aspirin EC tablet 81 mg  81 mg Oral Q breakfast Jolanta B Pucilowska, MD   81 mg at 02/04/16 0836  . cloNIDine (CATAPRES) tablet 0.1 mg  0.1 mg Oral Q6H PRN Auburn Bilberry, MD      . haloperidol (HALDOL) tablet 10 mg  10 mg Oral QHS Jolanta B Pucilowska, MD   10 mg at 02/03/16 2137  . hydrALAZINE (APRESOLINE) tablet 100 mg  100 mg Oral Q8H Auburn Bilberry, MD   100 mg at 02/04/16 0655  .  hydrochlorothiazide (HYDRODIURIL) tablet 50 mg  50 mg Oral Q breakfast Jolanta B Pucilowska, MD   50 mg at 02/04/16 0836  . hydrOXYzine (ATARAX/VISTARIL) tablet 50 mg  50 mg Oral TID PRN Shari Prows, MD   50 mg at 02/01/16 2119  . [START ON 02/05/2016] isosorbide mononitrate (IMDUR) 24 hr tablet 120 mg  120 mg Oral Q breakfast Auburn Bilberry, MD      . lisinopril (PRINIVIL,ZESTRIL) tablet 40 mg  40 mg Oral Q breakfast Jolanta B Pucilowska, MD   40 mg at 02/04/16 0835  . magnesium hydroxide (MILK OF MAGNESIA) suspension 30 mL  30 mL Oral Daily PRN Audery Amel, MD   30 mL at 01/31/16 2153  . metoprolol tartrate (LOPRESSOR) tablet 100 mg  100 mg Oral BID AC & HS Jolanta B Pucilowska, MD   100 mg at 02/04/16 0836  . traZODone (DESYREL) tablet 100 mg  100 mg Oral QHS Shari Prows, MD   100 mg at 02/03/16 2137    Lab Results: No results found for this or any previous visit (from the past 48 hour(s)).  Blood Alcohol level:  Lab Results  Component Value Date   [facility] <5 01/28/2016   ETH <11 06/01/2013    Physical Findings: AIMS: Facial and Oral Movements Muscles of Facial Expression: None, normal Lips and Perioral Area: None, normal Jaw: None, normal Tongue: None, normal,Extremity Movements Upper (arms, wrists, hands, fingers): None, normal Lower (legs, knees, ankles, toes): None, normal, Trunk Movements Neck, shoulders, hips: None, normal, Overall Severity Severity of abnormal movements (highest score from questions above): None, normal Incapacitation due to abnormal movements: None, normal Patient's awareness of abnormal movements (rate only patient's report): No Awareness, Dental Status Current problems with teeth and/or dentures?: No Does patient usually wear dentures?: No  CIWA:    COWS:     Musculoskeletal: Strength & Muscle Tone: within normal limits Gait & Station: normal Patient leans: N/A  Psychiatric Specialty Exam: Review of Systems   Psychiatric/Behavioral: Positive for depression and suicidal ideas. The patient is nervous/anxious.   All other systems reviewed and are negative.   Blood pressure 145/93, pulse 63, temperature 97.9 F (36.6 C), temperature source Oral, resp. rate 20, height 5\' 9"  (1.753 m), weight 180.078 kg (397 lb), SpO2 99 %.Body mass index is 58.6 kg/(m^2).  General Appearance: Fairly Groomed  Patent attorney::  Good  Speech:  Clear and Coherent  Volume:  Normal  Mood:  Depressed, Hopeless and Worthless  Affect:  Blunt  Thought Process:  Disorganized  Orientation:  Full (Time, Place, and Person)  Thought Content:  Delusions and Paranoid Ideation  Suicidal Thoughts:  No  Homicidal Thoughts:  No  Memory:  Immediate;   Fair Recent;   Fair Remote;   Fair  Judgement:  Poor  Insight:  Lacking  Psychomotor Activity:  Psychomotor Retardation  Concentration:  Fair  Recall:  [employer] of Knowledge:Fair  Language: Fair  Akathisia:  No  Handed:  Right  AIMS (if indicated):     Assets:  Communication Skills Desire for Improvement Housing Physical Health Resilience Social Support  ADL's:  Intact  Cognition: WNL  Sleep:  Number of Hours: 7.3   Treatment Plan Summary: Daily contact with patient to assess and evaluate symptoms and progress in treatment and Medication management   Mr. Timothy Jackson is a 45 year old male with a history of delusional disorder admitted for worsening of depression, anxiety and aborted suicide attempt.  1. Suicidal ideation. The patient is able to contract for safety in the hospital.  2. Psychosis. He was started on [place] but disliked it. We switched to Haldol and increased dose to 10 mg nightly for psychosis and started Prozac for depression.  3. History of AVM. He was diagnosed years ago, prior to onset of psychosis. He has not been compliant with his follow-up appointments. I spoke briefly with Dr. Thad Ranger, neurology, who would consider MRI when patient lass  anxious/psychotic.  4. Diagnostic clarification. We'll order an MMPI and psychological consults. He is still struggling to complete the test.  5. Anxiety. We will offer hydroxyzine for anxiety.  6. Hypertension. Medicine consult is greatly appreciated. Lopressor and Hydralazine were added to his regimen of hydrochlorothiazide, lisinopril, and Metoprolol. Blood pressure is an acceptable range today.  7. Metabolic syndrome screening. The patient is obese. His triglycerides are slightly elevated, TSH is normal. Hemoglobin A1c 6.3, prolactin 21.1.  8. Alcohol abuse. The patient has been drinking more lately, 2-3 beers a day. There are no symptoms of alcohol withdrawal.   9. Substance abuse treatment. The patient is a daily marijuana smoker. He minimizes his problems and declines treatment.   10. Smoking. Nicotine patch is available.  11. Insomnia. We started trazodone.   12. Daytime somnolence. Possibly from sleep apnea. Will consider Provigil.   13. Disposition. He will be discharged to home. He will follow-up with a new psychiatrist.  Kristine Linea, MD 02/04/2016, 12:32 PM

## 2016-02-04 NOTE — Progress Notes (Signed)
  D: Patient stated slept fair last night .Stated appetite is good and energy level Is normal. Stated concentration is good . Stated on Depression scale 1, hopeless 1 and anxiety 10 .( low 0-10 high) Denies suicidal homicidal ideations . No auditory hallucinations No pain concerns . Appropriate ADL'S. Interacting with peers and staff. Goal to day go to groups. Patient noted to get very paranoid with his peers . Voice of Some Of the men on the unit watching him And whispering to each other .  A: Encourage patient participation with unit programming . Instruction Given on Medication , verbalize understanding. R: Voice no other concerns. Staff continue to monitor.

## 2016-02-04 NOTE — Progress Notes (Signed)
A&Ox3, denied SI/HI, c/o auditory hallucinations, "I just want to go out and yell out loud, can we go out there? I have Nervous attacks, anxiety and stuffs brought me here..." Many cups of water noted in his room, able to throw away about 4 of them. HOB raised to comfort; medication compliant.

## 2016-02-05 LAB — BASIC METABOLIC PANEL
ANION GAP: 10 (ref 5–15)
BUN: 18 mg/dL (ref 6–20)
CALCIUM: 9.3 mg/dL (ref 8.9–10.3)
CO2: 27 mmol/L (ref 22–32)
Chloride: 98 mmol/L — ABNORMAL LOW (ref 101–111)
Creatinine, Ser: 1.41 mg/dL — ABNORMAL HIGH (ref 0.61–1.24)
GFR, EST NON AFRICAN AMERICAN: 58 mL/min — AB (ref >60)
Glucose, Bld: 128 mg/dL — ABNORMAL HIGH (ref 65–99)
POTASSIUM: 3.7 mmol/L (ref 3.5–5.1)
SODIUM: 135 mmol/L (ref 135–145)

## 2016-02-05 MED ORDER — ZIPRASIDONE HCL 40 MG PO CAPS
40.0000 mg | ORAL_CAPSULE | Freq: Two times a day (BID) | ORAL | Status: DC
  Administered 2016-02-05 – 2016-02-06 (×2): 40 mg via ORAL
  Filled 2016-02-05 (×2): qty 1

## 2016-02-05 MED ORDER — LURASIDONE HCL 40 MG PO TABS: 80.0000 mg | ORAL_TABLET | Freq: Every day | ORAL | Status: DC

## 2016-02-05 NOTE — [facility] Group Notes (Signed)
[facility] Group Notes:  (Nursing/MHT/Case Management/Adjunct)  Date:  02/05/2016  Time:  1:12 AM  Type of Therapy:  Group Therapy  Participation Level:  Did Not Attend   Summary of Progress/Problems:  Timothy Jackson Imani Anila Bojarski 02/05/2016, 1:12 AM

## 2016-02-05 NOTE — Progress Notes (Signed)
D: Patient stated slept good last night slept during shift  Until dinner time  .Stated appetite is good and energy level  Is normal. Stated concentration is good . Stated on Depression scale , hopeless and anxiety .( low 0-10 high) Denies suicidal  homicidal ideations  .  No auditory hallucinations  No pain concerns .  No Appropriate ADL'S. Limited Interacting with peers and staff. Patient remained  guarded and paranoid  With staff. Noted tearfull when informed of mother calling tor check on patient  A: Encourage patient participation with unit programming . Instruction  Given on  Medication  Started on [place] , verbalize understanding. R: Voice no other concerns. Staff continue to monitor

## 2016-02-05 NOTE — Consult Note (Signed)
[facility] - [place] at [facility]                                                                                                                                                                                            Patient Demographics   Timothy Jackson, is a 45 y.o. male, DOB - 10-Feb-1971, ONG:[id number]  Admit date - 01/28/2016   Admitting Physician No admitting provider for patient encounter.  Outpatient Primary MD for the patient is No PCP Per Patient   LOS - 8  Subjective: Blood pressure continues to improve    Review of Systems:   CONSTITUTIONAL: No documented fever. No fatigue, weakness. No weight gain, no weight loss.  EYES: No blurry or double vision.  ENT: No tinnitus. No postnasal drip. No redness of the oropharynx.  RESPIRATORY: No cough, no wheeze, no hemoptysis. No dyspnea.  CARDIOVASCULAR: No chest pain. No orthopnea. No palpitations. No syncope.  GASTROINTESTINAL: No nausea, no vomiting or diarrhea. No abdominal pain. No melena or hematochezia.  GENITOURINARY: No dysuria or hematuria.  ENDOCRINE: No polyuria or nocturia. No heat or cold intolerance.  HEMATOLOGY: No anemia. No bruising. No bleeding.  INTEGUMENTARY: No rashes. No lesions.  MUSCULOSKELETAL: No arthritis. No swelling. No gout.  NEUROLOGIC: No numbness, tingling, or ataxia. No seizure-type activity.  PSYCHIATRIC: Depressed, anxious   Vitals:   Filed Vitals:   02/04/16 1155 02/04/16 1404 02/04/16 1900 02/05/16 0700  BP: 145/93 154/97 150/92 128/85  Pulse:   70 71  Temp:    98 F (36.7 C)  TempSrc:    Oral  Resp:   20 20  Height:      Weight:      SpO2:   98%     Wt Readings from Last 3 Encounters:  01/28/16 180.078 kg (397 lb)  01/28/16 142.883 kg (315 lb)  05/16/13 123.378 kg (272 lb)     Intake/Output Summary (Last 24 hours) at 02/05/16 1223 Last data filed at 02/05/16 0939  Gross per 24 hour  Intake    480 ml  Output      0 ml  Net    480 ml     Physical Exam:   GENERAL: Pleasant-appearing in no apparent distress.  HEAD, EYES, EARS, NOSE AND THROAT: Atraumatic, normocephalic. Extraocular muscles are intact. Pupils equal and reactive to light. Sclerae anicteric. No conjunctival injection. No oro-pharyngeal erythema.  NECK: Supple. There is no jugular venous distention. No bruits, no lymphadenopathy, no thyromegaly.  HEART: Regular rate and rhythm,. No murmurs, no rubs, no clicks.  LUNGS: Clear to auscultation bilaterally. No rales or rhonchi. No wheezes.  ABDOMEN: Soft, flat, nontender, nondistended. Has good bowel sounds. No hepatosplenomegaly appreciated.  EXTREMITIES: No evidence of any cyanosis, clubbing, or peripheral edema.  +2 pedal and radial pulses bilaterally.  NEUROLOGIC: The patient is alert, awake, and oriented x3 with no focal motor or sensory deficits appreciated bilaterally.  SKIN: Moist and warm with no rashes appreciated.  Psych: Depressed affect LN: No inguinal LN enlargement    Antibiotics   Anti-infectives    None      Medications   Scheduled Meds: . aspirin EC  81 mg Oral Q breakfast  . haloperidol  10 mg Oral QHS  . hydrALAZINE  100 mg Oral Q8H  . hydrochlorothiazide  50 mg Oral Q breakfast  . isosorbide mononitrate  120 mg Oral Q breakfast  . lisinopril  40 mg Oral Q breakfast  . metoprolol tartrate  100 mg Oral BID AC & HS  . traZODone  100 mg Oral QHS   Continuous Infusions:  PRN Meds:.acetaminophen, alum & mag hydroxide-simeth, cloNIDine, hydrOXYzine, LORazepam, magnesium hydroxide   Data Review:   Micro Results No results found for this or any previous visit (from the past 240 hour(s)).  Radiology Reports No results found.   CBC No results for input(s): WBC, HGB, HCT, PLT, MCV, MCH, MCHC, RDW, LYMPHSABS, MONOABS, EOSABS, BASOSABS, BANDABS in the last 168 hours.  Invalid input(s): NEUTRABS, BANDSABD  Chemistries   Recent Labs Lab 02/05/16 0658  NA 135  K 3.7  CL 98*   CO2 27  GLUCOSE 128*  BUN 18  CREATININE 1.41*  CALCIUM 9.3   ------------------------------------------------------------------------------------------------------------------ estimated creatinine clearance is 104.9 mL/min (by C-G formula based on Cr of 1.41). ------------------------------------------------------------------------------------------------------------------ No results for input(s): HGBA1C in the last 72 hours. ------------------------------------------------------------------------------------------------------------------ No results for input(s): CHOL, HDL, LDLCALC, TRIG, CHOLHDL, LDLDIRECT in the last 72 hours. ------------------------------------------------------------------------------------------------------------------ No results for input(s): TSH, T4TOTAL, T3FREE, THYROIDAB in the last 72 hours.  Invalid input(s): FREET3 ------------------------------------------------------------------------------------------------------------------ No results for input(s): VITAMINB12, FOLATE, FERRITIN, TIBC, IRON, RETICCTPCT in the last 72 hours.  Coagulation profile No results for input(s): INR, PROTIME in the last 168 hours.  No results for input(s): DDIMER in the last 72 hours.  Cardiac Enzymes No results for input(s): CKMB, TROPONINI, MYOGLOBIN in the last 168 hours.  Invalid input(s): CK ------------------------------------------------------------------------------------------------------------------ Invalid input(s): POCBNP    Assessment & Plan   45 y/o male with hx of AVM and HTN (not taking medications for over 3 years) with accelerated HTN and likely rebound HTN from clonidine.  1. Accelerated HTN Blood pressure still elevated T clonidine when necessary Hydralazine 100 mg every 8 hours Hydrochlorothiazide 50 mg daily Lisinopril 40 daily Metoprolol 100 twice a day imdure dose stable  2. Hx of AVM: Asymptomatic  3. Tobacco dependence:  Recommended to  stop  4. Psychosis with SI: Management per Psychiatry  5. Morbid obesity with likely OSA: Encouraged weight loss outpatient sleep study  6. EtOh abuse: No evidence of withdrawal     Code Status Orders        Start     Ordered   01/28/16 1849  Full code   Continuous     01/28/16 1848    Code Status History    Date Active Date Inactive Code Status Order ID Comments User Context   05/15/2013  8:26 PM 05/17/2013  1:38 AM Full Code [id number]  Vida RollerBrian D Miller, MD ED             DVT Prophylaxis  Ambulatory  Lab Results  Component Value Date   PLT 291 01/28/2016     Time Spent in minutes52min  Greater than 50% of time spent in care coordination and counseling patient regarding the condition and plan of care.   Auburn Bilberry M.D on 02/05/2016 at 12:23 PM  Between 7am to 6pm - Pager - [phone]  After 6pm go to [email or web address] - password [email or web address] [facility]  [facility]   Office  [phone]

## 2016-02-05 NOTE — [facility] Group Notes (Signed)
[facility] LCSW Group Therapy  02/05/2016 10:45 AM  Type of Therapy:  Group Therapy  Participation Level:  Did Not Attend  Summary of Progress/Problems: Patient was called to group but did not attend.  Juanya Villavicencio T, MSW, LCSW 02/05/2016, 10:45 AM 

## 2016-02-05 NOTE — [facility] Group Notes (Signed)
[facility] LCSW Group Therapy  02/05/2016 2:27 PM  Type of Therapy:  Group Therapy  Participation Level:  Did Not Attend  Summary of Progress/Problems: Patient was called to group this afternoon but did not attend.   Lulu RidingIngle, Leighana Neyman T, MSW, LCSW 02/05/2016, 2:27 PM

## 2016-02-05 NOTE — Progress Notes (Addendum)
[facility] MD Progress Note  02/05/2016 12:18 PM Timothy Jackson  MRN:  [id number]  Subjective:  Timothy Jackson was doing well yesterday morning but in the evening he was in a major psychotic episode. He believed that patients in the hospital conspired against him and were trying to hurt him. He also believed that police was outside his window to arrest him. He was hallucinating in his room and very frightened demanding to leave the hospital. He was given a dose of Zyprexa and Ativan with improvement. Today he feels better. There is no agitation but he is still paranoid and more disorganized today hardly able to participate in the interview. He seems to respond to internal stimuli. He is again secluded to his room.  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 HTN (hypertension) [I10] 01/29/2016  . 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 Cannabis use disorder, moderate, dependence ([place]) [F12.20] 01/28/2016  . Alcohol use disorder, moderate, dependence ([place]) [F10.20] 01/28/2016  . Suicidal ideation [R45.851] 01/28/2016  . Tobacco use disorder [F17.200] 01/28/2016  . Generalized anxiety disorder [F41.1] 05/18/2013   Total Time spent with patient: 20 minutes  Past Psychiatric History: Depression, psychosis.  Past Medical History:  Past Medical History  Diagnosis Date  . Hypertension   . Deaf     Right ear  . History of cardiac cath 02/24/2010     LVH, EF of 50-55%, LAD has 10-15% proximal stenosis, by Dr. Terrence Dupont  . Anxiety, generalized   . Erectile dysfunction   . Delusional disorder ([place])   . 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 Cardiac catheterization  01/2010     "essentially negative"  . Fracture surgery Left     ORIF left arm as a child   Family History:  Family History  Problem Relation Age of Onset  . Hypertension Father   . Coronary artery disease Maternal Grandmother   . Hypertension Maternal Grandmother   . Diabetes Maternal Grandmother   . Coronary artery disease Maternal Uncle   . Hypertension Maternal Uncle   . Diabetes Maternal Aunt   . Leukemia Maternal Uncle    Family Psychiatric  History: See H&P.` Social History:  History  Alcohol Use  . 0.0 oz/week    Comment: daily     History  Drug Use  . Yes  . Special: Marijuana    Comment: Occassional marijuana use    Social History   Social History  . Marital Status: Single    Spouse Name: N/A  . Number of Children: N/A  . Years of Education: N/A   Occupational History  . desk job     Sports administrator   Social History Main Topics  . Smoking status: Current Some Day Smoker    Types: Cigarettes  . Smokeless tobacco: None  . Alcohol Use: 0.0 oz/week     Comment: daily  . Drug Use: Yes    Special: Marijuana     Comment: Occassional marijuana use  . Sexual Activity: Not Currently   Other Topics Concern  . None   Social History Narrative   Additional Social History:  Sleep: Fair  Appetite:  Fair  Current Medications: Current Facility-Administered Medications  Medication Dose Route Frequency Provider Last Rate Last Dose  . acetaminophen (TYLENOL) tablet 650 mg  650 mg Oral Q6H PRN Gonzella Lex, MD      . alum & mag hydroxide-simeth (MAALOX/MYLANTA) 200-200-20 MG/5ML suspension 30 mL  30 mL Oral Q4H PRN Gonzella Lex, MD      . aspirin EC tablet 81 mg  81 mg Oral Q breakfast Lesia Monica B Kaizen Ibsen, MD   81 mg at 02/05/16 0647  . cloNIDine (CATAPRES) tablet 0.1 mg  0.1 mg Oral Q6H PRN Dustin Flock, MD      . haloperidol (HALDOL) tablet 10 mg  10 mg Oral QHS Clovis Fredrickson, MD   10 mg at 02/04/16 2205  . hydrALAZINE  (APRESOLINE) tablet 100 mg  100 mg Oral Q8H Dustin Flock, MD   100 mg at 02/05/16 0647  . hydrochlorothiazide (HYDRODIURIL) tablet 50 mg  50 mg Oral Q breakfast Clovis Fredrickson, MD   50 mg at 02/05/16 0650  . hydrOXYzine (ATARAX/VISTARIL) tablet 50 mg  50 mg Oral TID PRN Clovis Fredrickson, MD   50 mg at 02/04/16 1942  . isosorbide mononitrate (IMDUR) 24 hr tablet 120 mg  120 mg Oral Q breakfast Dustin Flock, MD   120 mg at 02/05/16 0651  . lisinopril (PRINIVIL,ZESTRIL) tablet 40 mg  40 mg Oral Q breakfast Clovis Fredrickson, MD   40 mg at 02/05/16 0649  . LORazepam (ATIVAN) tablet 2 mg  2 mg Oral Q4H PRN Gonzella Lex, MD   2 mg at 02/04/16 1942  . magnesium hydroxide (MILK OF MAGNESIA) suspension 30 mL  30 mL Oral Daily PRN Gonzella Lex, MD   30 mL at 01/31/16 2153  . metoprolol tartrate (LOPRESSOR) tablet 100 mg  100 mg Oral BID AC & HS Latayvia Mandujano B Mikayela Deats, MD   100 mg at 02/05/16 0648  . traZODone (DESYREL) tablet 100 mg  100 mg Oral QHS Clovis Fredrickson, MD   100 mg at 02/04/16 2205    Lab Results:  Results for orders placed or performed during the hospital encounter of 01/28/16 (from the past 48 hour(s))  Basic metabolic panel     Status: Abnormal   Collection Time: 02/05/16  6:58 AM  Result Value Ref Range   Sodium 135 135 - 145 mmol/L   Potassium 3.7 3.5 - 5.1 mmol/L   Chloride 98 (L) 101 - 111 mmol/L   CO2 27 22 - 32 mmol/L   Glucose, Bld 128 (H) 65 - 99 mg/dL   BUN 18 6 - 20 mg/dL   Creatinine, Ser 1.41 (H) 0.61 - 1.24 mg/dL   Calcium 9.3 8.9 - 10.3 mg/dL   GFR calc non Af Amer 58 (L) >60 mL/min   GFR calc Af Amer >60 >60 mL/min    Comment: (NOTE) The eGFR has been calculated using the CKD EPI equation. This calculation has not been validated in all clinical situations. eGFR's persistently <60 mL/min signify possible Chronic Kidney Disease.    Anion gap 10 5 - 15    Blood Alcohol level:  Lab Results  Component Value Date   ETH <5 01/28/2016   ETH  <11 06/01/2013    Physical Findings: AIMS: Facial and Oral Movements Muscles of Facial Expression: None, normal Lips and Perioral Area: None, normal Jaw: None, normal Tongue: None, normal,Extremity Movements Upper (arms, wrists, hands, fingers): None, normal Lower (legs, knees, ankles,  toes): None, normal, Trunk Movements Neck, shoulders, hips: None, normal, Overall Severity Severity of abnormal movements (highest score from questions above): None, normal Incapacitation due to abnormal movements: None, normal Patient's awareness of abnormal movements (rate only patient's report): No Awareness, Dental Status Current problems with teeth and/or dentures?: No Does patient usually wear dentures?: No  CIWA:    COWS:     Musculoskeletal: Strength & Muscle Tone: within normal limits Gait & Station: normal Patient leans: N/A  Psychiatric Specialty Exam: Review of Systems  Psychiatric/Behavioral: Positive for depression and hallucinations.  All other systems reviewed and are negative.   Blood pressure 128/85, pulse 71, temperature 98 F (36.7 C), temperature source Oral, resp. rate 20, height 5' 9"  (1.753 m), weight 180.078 kg (397 lb), SpO2 98 %.Body mass index is 58.6 kg/(m^2).  General Appearance: Fairly Groomed  Engineer, water::  Minimal  Speech:  Clear and Coherent  Volume:  Normal  Mood:  Anxious  Affect:  Blunt  Thought Process:  Disorganized  Orientation:  Full (Time, Place, and Person)  Thought Content:  Delusions, Hallucinations: Auditory Visual and Paranoid Ideation  Suicidal Thoughts:  No  Homicidal Thoughts:  No  Memory:  Immediate;   Fair Recent;   Fair Remote;   Fair  Judgement:  Fair  Insight:  Fair  Psychomotor Activity:  Psychomotor Retardation  Concentration:  Fair  Recall:  [employer] of Knowledge:Fair  Language: Fair  Akathisia:  No  Handed:  Right  AIMS (if indicated):     Assets:  Communication Skills Desire for Improvement Physical  Health Resilience Social Support  ADL's:  Intact  Cognition: WNL  Sleep:  Number of Hours: 6.3   Treatment Plan Summary: Daily contact with patient to assess and evaluate symptoms and progress in treatment and Medication management   Timothy Jackson is a 45 year old male with a history of delusional disorder admitted for worsening of depression, anxiety and aborted suicide attempt.  1. Suicidal ideation. The patient is able to contract for safety in the hospital.  2. Psychosis. He was started on [place] but disliked it. We switched to Haldol and increased dose to 10 mg nightly for psychosis and started Prozac for depression. He received a dose of Zyprexa and Ativan last night with improvement. He is obese and noncompliant. Zyprexa would not be the best choice for him long term. Will start Geodon.  3. History of AVM. He was diagnosed years ago, prior to onset of psychosis. He has not been compliant with his follow-up appointments. I spoke briefly with Dr. Doy Mince, neurology, who would consider MRI when patient lass anxious/psychotic.  4. Diagnostic clarification. We'll order an MMPI and psychological consults. He has been struggling to complete the test.  5. Anxiety. We will offer hydroxyzine for anxiety.  6. Hypertension. Medicine consult is greatly appreciated. Lopressor and Hydralazine were added to his regimen of hydrochlorothiazide, lisinopril, and Metoprolol. Blood pressure is an acceptable range today.  7. Metabolic syndrome screening. The patient is obese. His triglycerides are slightly elevated, TSH is normal. Hemoglobin A1c 6.3, prolactin 21.1.  8. Alcohol abuse. The patient has been drinking more lately, 2-3 beers a day. There are no symptoms of alcohol withdrawal.   9. Substance abuse treatment. The patient is a daily marijuana smoker. He minimizes his problems and declines treatment.   10. Smoking. Nicotine patch is available.  11. Insomnia. We started trazodone.   12.  Daytime somnolence. Possibly from sleep apnea. Will consider Provigil.   13. Disposition. He will  be discharged to home. He will follow-up with a new psychiatrist.  Orson Slick, MD 02/05/2016, 12:18 PM

## 2016-02-05 NOTE — Plan of Care (Signed)
Problem: Alteration in thought process Goal: STG-Patient is able to discuss thoughts with staff Outcome: Progressing Able ty talk with staff about  Concerns

## 2016-02-05 NOTE — [facility] Group Notes (Signed)
[facility] Group Notes:  (Nursing/MHT/Case Management/Adjunct)  Date:  02/05/2016  Time:  3:57 PM  Type of Therapy:  Music Therapy  Participation Level:  Did Not Attend   Timothy Jackson Timothy Jackson 02/05/2016, 3:57 PM

## 2016-02-05 NOTE — [facility] Group Notes (Signed)
[facility] LCSW Aftercare Discharge Planning Group Note   02/05/2016 10:40 AM  Participation Quality:  Patient was called to group but did not attend.     Mardel Grudzien T, MSW, LCSW  

## 2016-02-06 MED ORDER — HALOPERIDOL 5 MG PO TABS
5.0000 mg | ORAL_TABLET | Freq: Every day | ORAL | Status: DC
  Administered 2016-02-06 – 2016-02-08 (×3): 5 mg via ORAL
  Filled 2016-02-06 (×3): qty 1

## 2016-02-06 MED ORDER — ZIPRASIDONE HCL 40 MG PO CAPS
80.0000 mg | ORAL_CAPSULE | Freq: Two times a day (BID) | ORAL | Status: DC
  Administered 2016-02-06 – 2016-02-08 (×4): 80 mg via ORAL
  Filled 2016-02-06 (×4): qty 2

## 2016-02-06 MED ORDER — OLANZAPINE 5 MG PO TBDP: 10.0000 mg | ORAL_TABLET | Freq: Three times a day (TID) | ORAL | Status: DC | PRN

## 2016-02-06 NOTE — Plan of Care (Signed)
Problem: Diagnosis: Increased Risk For Suicide Attempt Goal: STG-Patient Will Comply With Medication Regime Outcome: Progressing Pt taking medications as prescribed.  Problem: Alteration in thought process Goal: STG-Patient is able to follow short directions Outcome: Progressing Pt follows directions from staff regarding medication administration.

## 2016-02-06 NOTE — Plan of Care (Signed)
Problem: Alteration in thought process Goal: STG-Patient does not respond to command hallucinations Outcome: Progressing Denies any hallucinations.

## 2016-02-06 NOTE — Progress Notes (Signed)
D: Pt continues to stay in his room sleeping throughout the evening. He comes out for medications. Denies SI/HI/AVH at this time. Denies pain. Pt remains guarded and forwards little information upon assessment. A: Emotional support and encouragement provided. Medications administered with education. q15 minute safety checks maintained. R: Pt remains free from harm. Will continue to monitor.

## 2016-02-06 NOTE — Progress Notes (Signed)
Patient verbalized this morning that he feels better today.His affect is brighter.Denies suicidal ideations and AV hallucinations.Attended groups.Appropriate with staff & peers.Compliant with medications.

## 2016-02-06 NOTE — [facility] Group Notes (Signed)
[facility] LCSW Group Therapy  02/06/2016 2:40 PM  Type of Therapy:  Group Therapy  Participation Level:  Active  Participation Quality:  Appropriate and Attentive  Affect:  Appropriate  Cognitive:  Alert, Appropriate and Oriented  Insight:  Engaged  Engagement in Therapy:  Engaged  Modes of Intervention:  Discussion, Socialization and Support  Summary of Progress/Problems: Patient attended and participated in group. Patient participated in a game of Wheel of Fortune where group members went around the room and guessed a letter or attempted to solve using vocabulary words related to mental health. Patient was attentive throughout group and was able to relate to other group members who shared their struggles with acceptance and discussed self care for taking care of himself.    Lulu RidingIngle, Lupe Handley T, MSW, LCSW 02/06/2016, 2:40 PM
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
## 2016-02-06 NOTE — Consult Note (Signed)
  Mr. Timothy Jackson has not yet completed his MMPI-2. He has completed only 165 questions. The test was taken from him with the explanation that the results would no longer be valid because of the length of time it is taking for him to complete it. He was agreeable.

## 2016-02-06 NOTE — [facility] Group Notes (Signed)
[facility] LCSW Group Therapy  02/06/2016 11:35 AM  Type of Therapy:  Group Therapy  Participation Level:  Did Not Attend  Summary of Progress/Problems: Patient was called to group but did not attend.  Thomasene Dubow T, MSW, LCSW 02/06/2016, 11:35 AM 

## 2016-02-06 NOTE — Consult Note (Signed)
[facility] [place] [facility]                                                                                                                                                                                            Patient Demographics   Timothy Jackson, is a 45 y.o. male, DOB - 24-Nov-1970, AOZ:[id number]  Admit date - 01/28/2016   Admitting Physician No admitting provider for patient encounter.  Outpatient Primary MD for the patient is No PCP Per Patient   LOS - 9  Subjective: Patient's blood pressure improved with the current regimen.    Review of Systems:   CONSTITUTIONAL: No documented fever. No fatigue, weakness. No weight gain, no weight loss.  EYES: No blurry or double vision.  ENT: No tinnitus. No postnasal drip. No redness of the oropharynx.  RESPIRATORY: No cough, no wheeze, no hemoptysis. No dyspnea.  CARDIOVASCULAR: No chest pain. No orthopnea. No palpitations. No syncope.  GASTROINTESTINAL: No nausea, no vomiting or diarrhea. No abdominal pain. No melena or hematochezia.  GENITOURINARY: No dysuria or hematuria.  ENDOCRINE: No polyuria or nocturia. No heat or cold intolerance.  HEMATOLOGY: No anemia. No bruising. No bleeding.  INTEGUMENTARY: No rashes. No lesions.  MUSCULOSKELETAL: No arthritis. No swelling. No gout.  NEUROLOGIC: No numbness, tingling, or ataxia. No seizure-type activity.  PSYCHIATRIC: Depressed, anxious   Vitals:   Filed Vitals:   02/05/16 1237 02/05/16 2125 02/06/16 0658 02/06/16 0700  BP: 112/60 119/74 146/81 143/95  Pulse: 62 66 62 68  Temp: 98.1 F (36.7 C) 98.2 F (36.8 C)  98 F (36.7 C)  TempSrc: Oral Oral    Resp: 18 18  18   Height:      Weight:      SpO2:        Wt Readings from Last 3 Encounters:  01/28/16 180.078 kg (397 lb)  01/28/16 142.883 kg (315 lb)  05/16/13 123.378 kg (272 lb)     Intake/Output Summary (Last 24 hours) at 02/06/16 1254 Last data filed at 02/06/16 0856  Gross per 24  hour  Intake    840 ml  Output      0 ml  Net    840 ml    Physical Exam:   GENERAL: Pleasant-appearing in no apparent distress.  HEAD, EYES, EARS, NOSE AND THROAT: Atraumatic, normocephalic. Extraocular muscles are intact. Pupils equal and reactive to light. Sclerae anicteric. No conjunctival injection. No oro-pharyngeal erythema.  NECK: Supple. There is no jugular venous distention. No bruits, no lymphadenopathy, no thyromegaly.  HEART: Regular rate and rhythm,. No murmurs, no rubs, no clicks.  LUNGS: Clear  to auscultation bilaterally. No rales or rhonchi. No wheezes.  ABDOMEN: Soft, flat, nontender, nondistended. Has good bowel sounds. No hepatosplenomegaly appreciated.  EXTREMITIES: No evidence of any cyanosis, clubbing, or peripheral edema.  +2 pedal and radial pulses bilaterally.  NEUROLOGIC: The patient is alert, awake, and oriented x3 with no focal motor or sensory deficits appreciated bilaterally.  SKIN: Moist and warm with no rashes appreciated.  Psych: Depressed affect LN: No inguinal LN enlargement    Antibiotics   Anti-infectives    None      Medications   Scheduled Meds: . aspirin EC  81 mg Oral Q breakfast  . haloperidol  5 mg Oral QHS  . hydrALAZINE  100 mg Oral Q8H  . hydrochlorothiazide  50 mg Oral Q breakfast  . isosorbide mononitrate  120 mg Oral Q breakfast  . lisinopril  40 mg Oral Q breakfast  . metoprolol tartrate  100 mg Oral BID AC & HS  . traZODone  100 mg Oral QHS  . ziprasidone  80 mg Oral BID WC   Continuous Infusions:  PRN Meds:.acetaminophen, alum & mag hydroxide-simeth, cloNIDine, hydrOXYzine, LORazepam, magnesium hydroxide, OLANZapine zydis   Data Review:   Micro Results No results found for this or any previous visit (from the past 240 hour(s)).  Radiology Reports No results found.   CBC No results for input(s): WBC, HGB, HCT, PLT, MCV, MCH, MCHC, RDW, LYMPHSABS, MONOABS, EOSABS, BASOSABS, BANDABS in the last 168  hours.  Invalid input(s): NEUTRABS, BANDSABD  Chemistries   Recent Labs Lab 02/05/16 0658  NA 135  K 3.7  CL 98*  CO2 27  GLUCOSE 128*  BUN 18  CREATININE 1.41*  CALCIUM 9.3   ------------------------------------------------------------------------------------------------------------------ estimated creatinine clearance is 104.9 mL/min (by C-G formula based on Cr of 1.41). ------------------------------------------------------------------------------------------------------------------ No results for input(s): HGBA1C in the last 72 hours. ------------------------------------------------------------------------------------------------------------------ No results for input(s): CHOL, HDL, LDLCALC, TRIG, CHOLHDL, LDLDIRECT in the last 72 hours. ------------------------------------------------------------------------------------------------------------------ No results for input(s): TSH, T4TOTAL, T3FREE, THYROIDAB in the last 72 hours.  Invalid input(s): FREET3 ------------------------------------------------------------------------------------------------------------------ No results for input(s): VITAMINB12, FOLATE, FERRITIN, TIBC, IRON, RETICCTPCT in the last 72 hours.  Coagulation profile No results for input(s): INR, PROTIME in the last 168 hours.  No results for input(s): DDIMER in the last 72 hours.  Cardiac Enzymes No results for input(s): CKMB, TROPONINI, MYOGLOBIN in the last 168 hours.  Invalid input(s): CK ------------------------------------------------------------------------------------------------------------------ Invalid input(s): POCBNP    Assessment & Plan   45 y/o male with hx of AVM and HTN (not taking medications for over 3 years) with accelerated HTN and likely rebound HTN from clonidine.  1. Accelerated HTN Blood pressure Now much improved  Continue T clonidine when necessary Hydralazine 100 mg every 8 hours Hydrochlorothiazide 50 mg  daily Lisinopril 40 daily Metoprolol 100 twice a day imdure dose stable  2. Hx of AVM: Asymptomatic  3. Tobacco dependence:  Recommended to stop  4. Psychosis with SI: Management per Psychiatry  5. Morbid obesity with likely OSA: Encouraged weight loss outpatient sleep study  6. EtOh abuse: No evidence of withdrawal   Please call with any questions will sign off continue regimen as above    Code Status Orders        Start     Ordered   01/28/16 1849  Full code   Continuous     01/28/16 1848    Code Status History    Date Active Date Inactive Code Status Order ID Comments User Context  05/15/2013  8:26 PM 05/17/2013  1:38 AM Full Code [id number]  Vida Roller, MD ED             DVT Prophylaxis  Ambulatory  Lab Results  Component Value Date   PLT 291 01/28/2016     Time Spent in minutes59min  Greater than 50% of time spent in care coordination and counseling patient regarding the condition and plan of care.   Auburn Bilberry M.D on 02/06/2016 at 12:54 PM  Between 7am to 6pm - Pager - [phone]  After 6pm go to [email or web address] - password [email or web address] [facility]  [facility]   Office  [phone]

## 2016-02-06 NOTE — Progress Notes (Addendum)
[facility] MD Progress Note  02/06/2016 12:18 PM TELL ROZELLE  MRN:  [id number]  Subjective:  Mr. Fennell has a 3 year history of paranoia that caused him to stay home. He now accepts medications but did not respond to Haldol much. Haldo lwas chosen to improve compliance with long acting form. On Wednesday, he had a major psychotic episode on the unit with severe paranoia and agitation. He was given Zyprexa and Ativan with success however his high weight precludes him from Zyprexa use. I started Geodon.  Today the patient is very anxious. He remembers that he had two cups of coffee on Wednesday when paranoia hit him. He had two cups of coffee today again. He seem to tolerate medications well and has been compliant. He is secluded to his room unable to participate in programming. He is still psychotic.  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 HTN (hypertension) [I10] 01/29/2016  . 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 Cannabis use disorder, moderate, dependence ([place]) [F12.20] 01/28/2016  . Alcohol use disorder, moderate, dependence ([place]) [F10.20] 01/28/2016  . Suicidal ideation [R45.851] 01/28/2016  . Tobacco use disorder [F17.200] 01/28/2016  . Generalized anxiety disorder [F41.1] 05/18/2013   Total Time spent with patient: 20 minutes  Past Psychiatric History: depression, psychosis.  Past Medical History:  Past Medical History  Diagnosis Date  . Hypertension   . Deaf     Right ear  . History of cardiac cath 02/24/2010     LVH, EF of 50-55%, LAD has 10-15% proximal stenosis, by Dr. Terrence Dupont  . Anxiety, generalized   . Erectile dysfunction   . Delusional disorder ([place])   . AVM (arteriovenous malformation) brain 04/20/2000     Past Surgical History  Procedure Laterality Date  . Cardiac catheterization  01/2010    "essentially negative"  . Fracture surgery Left     ORIF left arm as a child   Family History:  Family History  Problem Relation Age of Onset  . Hypertension Father   . Coronary artery disease Maternal Grandmother   . Hypertension Maternal Grandmother   . Diabetes Maternal Grandmother   . Coronary artery disease Maternal Uncle   . Hypertension Maternal Uncle   . Diabetes Maternal Aunt   . Leukemia Maternal Uncle    Family Psychiatric  History: See H&P. Social History:  History  Alcohol Use  . 0.0 oz/week    Comment: daily     History  Drug Use  . Yes  . Special: Marijuana    Comment: Occassional marijuana use    Social History   Social History  . Marital Status: Single    Spouse Name: N/A  . Number of Children: N/A  . Years of Education: N/A   Occupational History  . desk job     Sports administrator   Social History Main Topics  . Smoking status: Current Some Day Smoker    Types: Cigarettes  . Smokeless tobacco: None  . Alcohol Use: 0.0 oz/week     Comment: daily  . Drug Use: Yes    Special: Marijuana     Comment: Occassional marijuana use  . Sexual Activity: Not Currently   Other Topics Concern  . None   Social History Narrative   Additional Social History:  Sleep: Fair  Appetite:  Fair  Current Medications: Current Facility-Administered Medications  Medication Dose Route Frequency Provider Last Rate Last Dose  . acetaminophen (TYLENOL) tablet 650 mg  650 mg Oral Q6H PRN Gonzella Lex, MD      . alum & mag hydroxide-simeth (MAALOX/MYLANTA) 200-200-20 MG/5ML suspension 30 mL  30 mL Oral Q4H PRN Gonzella Lex, MD      . aspirin EC tablet 81 mg  81 mg Oral Q breakfast Tavon Magnussen B Eugenia Eldredge, MD   81 mg at 02/06/16 0839  . cloNIDine (CATAPRES) tablet 0.1 mg  0.1 mg Oral Q6H PRN Dustin Flock, MD      . haloperidol (HALDOL) tablet  10 mg  10 mg Oral QHS Krishay Faro B Hosie Sharman, MD   10 mg at 02/05/16 2227  . hydrALAZINE (APRESOLINE) tablet 100 mg  100 mg Oral Q8H Dustin Flock, MD   100 mg at 02/06/16 9983  . hydrochlorothiazide (HYDRODIURIL) tablet 50 mg  50 mg Oral Q breakfast Lariah Fleer B Thomasene Dubow, MD   50 mg at 02/06/16 0839  . hydrOXYzine (ATARAX/VISTARIL) tablet 50 mg  50 mg Oral TID PRN Clovis Fredrickson, MD   50 mg at 02/04/16 1942  . isosorbide mononitrate (IMDUR) 24 hr tablet 120 mg  120 mg Oral Q breakfast Dustin Flock, MD   120 mg at 02/06/16 0839  . lisinopril (PRINIVIL,ZESTRIL) tablet 40 mg  40 mg Oral Q breakfast Clovis Fredrickson, MD   40 mg at 02/06/16 0839  . LORazepam (ATIVAN) tablet 2 mg  2 mg Oral Q4H PRN Gonzella Lex, MD   2 mg at 02/04/16 1942  . magnesium hydroxide (MILK OF MAGNESIA) suspension 30 mL  30 mL Oral Daily PRN Gonzella Lex, MD   30 mL at 01/31/16 2153  . metoprolol tartrate (LOPRESSOR) tablet 100 mg  100 mg Oral BID AC & HS Dacen Frayre B Yerachmiel Spinney, MD   100 mg at 02/06/16 0658  . traZODone (DESYREL) tablet 100 mg  100 mg Oral QHS Clovis Fredrickson, MD   100 mg at 02/05/16 2227  . ziprasidone (GEODON) capsule 40 mg  40 mg Oral BID WC Clovis Fredrickson, MD   40 mg at 02/06/16 3825    Lab Results:  Results for orders placed or performed during the hospital encounter of 01/28/16 (from the past 48 hour(s))  Basic metabolic panel     Status: Abnormal   Collection Time: 02/05/16  6:58 AM  Result Value Ref Range   Sodium 135 135 - 145 mmol/L   Potassium 3.7 3.5 - 5.1 mmol/L   Chloride 98 (L) 101 - 111 mmol/L   CO2 27 22 - 32 mmol/L   Glucose, Bld 128 (H) 65 - 99 mg/dL   BUN 18 6 - 20 mg/dL   Creatinine, Ser 1.41 (H) 0.61 - 1.24 mg/dL   Calcium 9.3 8.9 - 10.3 mg/dL   GFR calc non Af Amer 58 (L) >60 mL/min   GFR calc Af Amer >60 >60 mL/min    Comment: (NOTE) The eGFR has been calculated using the CKD EPI equation. This calculation has not been validated in all clinical  situations. eGFR's persistently <60 mL/min signify possible Chronic Kidney Disease.    Anion gap 10 5 - 15    Blood Alcohol level:  Lab Results  Component Value Date   [facility] <5 01/28/2016   ETH <11 06/01/2013    Physical Findings: AIMS: Facial and Oral Movements Muscles of Facial Expression: None, normal  Lips and Perioral Area: None, normal Jaw: None, normal Tongue: None, normal,Extremity Movements Upper (arms, wrists, hands, fingers): None, normal Lower (legs, knees, ankles, toes): None, normal, Trunk Movements Neck, shoulders, hips: None, normal, Overall Severity Severity of abnormal movements (highest score from questions above): None, normal Incapacitation due to abnormal movements: None, normal Patient's awareness of abnormal movements (rate only patient's report): No Awareness, Dental Status Current problems with teeth and/or dentures?: No Does patient usually wear dentures?: No  CIWA:    COWS:     Musculoskeletal: Strength & Muscle Tone: within normal limits Gait & Station: normal Patient leans: N/A  Psychiatric Specialty Exam: Review of Systems  Psychiatric/Behavioral: Positive for depression and hallucinations. The patient is nervous/anxious.   All other systems reviewed and are negative.   Blood pressure 143/95, pulse 68, temperature 98 F (36.7 C), temperature source Oral, resp. rate 18, height '5\' 9"'$  (1.753 m), weight 180.078 kg (397 lb), SpO2 98 %.Body mass index is 58.6 kg/(m^2).  General Appearance: Disheveled  Eye Contact::  Minimal  Speech:  Clear and Coherent  Volume:  Normal  Mood:  Anxious  Affect:  Congruent  Thought Process:  Disorganized  Orientation:  Full (Time, Place, and Person)  Thought Content:  Delusions and Paranoid Ideation  Suicidal Thoughts:  No  Homicidal Thoughts:  No  Memory:  Immediate;   Fair Recent;   Fair Remote;   Fair  Judgement:  Poor  Insight:  Lacking  Psychomotor Activity:  Psychomotor Retardation  Concentration:   Fair  Recall:  [place] of Knowledge:Fair  Language: Fair  Akathisia:  No  Handed:  Right  AIMS (if indicated):     Assets:  Communication Skills Desire for Improvement Housing Leisure Time Physical Health Resilience Social Support  ADL's:  Intact  Cognition: WNL  Sleep:  Number of Hours: 7.5   Treatment Plan Summary: Daily contact with patient to assess and evaluate symptoms and progress in treatment and Medication management   Mr. Ress is a 45 year old male with a history of delusional disorder admitted for worsening of depression, anxiety and aborted suicide attempt.  1. Suicidal ideation. The patient is able to contract for safety in the hospital.  2. Psychosis. He was started on [place] but disliked it. We switched to Haldol and increased dose to 10 mg nightly for psychosis and started Prozac for depression. We will crosstaper Haldol for Geodon. Haldol 5 mg, Geodon 80 mg bid today.   3. History of AVM. He was diagnosed years ago, prior to onset of psychosis. He has not been compliant with his follow-up appointments. I spoke briefly with Dr. Doy Mince, neurology, who would consider MRI when patient lass anxious/psychotic.  4. Diagnostic clarification. We ordered MMPI and psychological consult. The patient has not been able to complete testing most likely due to paranoia.   5. Anxiety. We offer hydroxyzine and Ativan for anxiety.  6. Hypertension. Medicine involvement is greatly appreciated. He is on multiple antihypertensives as guided by our internists. Blood pressure is an acceptable range today.  7. Metabolic syndrome screening. The patient is obese. His triglycerides are slightly elevated, TSH is normal. Hemoglobin A1c 6.3, prolactin 21.1.  8. Alcohol abuse. The patient has been drinking more lately, 2-3 beers a day. There were no symptoms of alcohol withdrawal.   9. Substance abuse treatment. The patient is a daily marijuana smoker. He minimizes his problems and  declines treatment.   10. Smoking. Nicotine patch is available.  11. Insomnia. We started trazodone.  12. Daytime somnolence. Possibly from sleep apnea. Should we consider Provigil?  13. Agitation. Zyprexa zydis is available for bouts of paranoia/agitation.  14. Disposition. TBE. He was referred to [facility].   Orson Slick, MD 02/06/2016, 12:18 PM

## 2016-02-06 NOTE — [facility] Group Notes (Signed)
[facility] Group Notes:  (Nursing/MHT/Case Management/Adjunct)  Date:  02/06/2016  Time:  3:45 PM  Type of Therapy:  Psychoeducational Skills  Participation Level:  Active  Participation Quality:  Appropriate  Affect:  Appropriate  Cognitive:  Oriented  Insight:  Appropriate  Engagement in Group:  Engaged and outside discussion pt stated music helps him to relax.  Modes of Intervention:  Education  Summary of Progress/Problems:  Timothy Farberamela M Moses Jackson 02/06/2016, 3:45 PM

## 2016-02-07 NOTE — Progress Notes (Signed)
D: Observed pt in day room interacting with peers. Patient alert and oriented x4. Patient denies SI/HI/AVH. Pt affect is blunted and anxious. Pt described his mood as "anxious." Pt attributed source of anxiety as "drank too much coffee...nothing bad happened." Pt endorsed going to group and learning positive coping skills. Pt rated depression 1/10 and anxiety 7/10.  A: Offered active listening and support. Provided therapeutic communication. Administered scheduled medications. Gave Ativan prn for anxiety. Held metoprolol, as pulse rate was 64.  R: Pt pleasant and cooperative. Pt medication compliant. Will continue Q15 min. checks. Safety maintained.

## 2016-02-07 NOTE — [facility] Group Notes (Signed)
[facility] LCSW Group Therapy  02/07/2016 2:14 PM  Type of Therapy:  Group Therapy  Participation Level:  Did Not Attend  Modes of Intervention:  Discussion, Education, Socialization and Support  Summary of Progress/Problems: Balance in life: Patients will discuss the concept of balance and how it looks and feels to be unbalanced. Pt will identify areas in their life that is unbalanced and ways to become more balanced.    Geneieve Duell L Honesti Seaberg MSW, LCSWA  02/07/2016, 2:14 PM  

## 2016-02-07 NOTE — Progress Notes (Signed)
Pt has been pleasant and cooperative. Pt's mood and affect has been depressed. Pt denies SI and A/V hallucinations. Pt appeared to be somewhat confused at times. Pt has been active on the unit. 

## 2016-02-07 NOTE — Plan of Care (Signed)
Problem: Diagnosis: Increased Risk For Suicide Attempt Goal: STG-Patient Will Comply With Medication Regime Outcome: Progressing Pt compliant with medication regimen     

## 2016-02-07 NOTE — Progress Notes (Signed)
[facility] MD Progress Note  02/07/2016 4:34 PM HANISH LARAIA  MRN:  [id number]  Subjective:  Mr. Brow has a 3 year history of paranoia that caused him to stay home. He now accepts medications but did not respond to Haldol much. Haldo lwas chosen to improve compliance with long acting form. On Wednesday, he had a major psychotic episode on the unit with severe paranoia and agitation. He was given Zyprexa and Ativan with success however his high weight precludes him from Zyprexa use. I started Geodon.  Follow-up Saturday the 13th. Patient seen chart reviewed. Patient says he is feeling slightly better today. Still looks paranoid. Has some paranoid thinking. He remembers having gotten agitated earlier in the week. Talks a lot about whether coffee had anything to do with it. Not reporting any dangerous thinking today. Appears to be tolerating medicine. 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 HTN (hypertension) [I10] 01/29/2016  . 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 Cannabis use disorder, moderate, dependence ([facility]) [F12.20] 01/28/2016  . Alcohol use disorder, moderate, dependence ([facility]) [F10.20] 01/28/2016  . Suicidal ideation [R45.851] 01/28/2016  . Tobacco use disorder [F17.200] 01/28/2016  . Generalized anxiety disorder [F41.1] 05/18/2013   Total Time spent with patient: 20 minutes  Past Psychiatric History: depression, psychosis.  Past Medical History:  Past Medical History  Diagnosis Date  . Hypertension   . Deaf     Right ear  . History of cardiac cath 02/24/2010     LVH, EF of 50-55%, LAD has 10-15% proximal stenosis, by Dr. Sharyn Lull  . Anxiety, generalized   . Erectile dysfunction    . Delusional disorder ([facility])   . 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 Cardiac catheterization  01/2010    "essentially negative"  . Fracture surgery Left     ORIF left arm as a child   Family History:  Family History  Problem Relation Age of Onset  . Hypertension Father   . Coronary artery disease Maternal Grandmother   . Hypertension Maternal Grandmother   . Diabetes Maternal Grandmother   . Coronary artery disease Maternal Uncle   . Hypertension Maternal Uncle   . Diabetes Maternal Aunt   . Leukemia Maternal Uncle    Family Psychiatric  History: See H&P. Social History:  History  Alcohol Use  . 0.0 oz/week    Comment: daily     History  Drug Use  . Yes  . Special: Marijuana    Comment: Occassional marijuana use    Social History   Social History  . Marital Status: Single    Spouse Name: N/A  . Number of Children: N/A  . Years of Education: N/A   Occupational History  . desk job     Scientist, water quality   Social History Main Topics  . Smoking status: Current Some Day Smoker    Types: Cigarettes  . Smokeless tobacco: None  . Alcohol Use: 0.0 oz/week     Comment: daily  . Drug Use: Yes    Special: Marijuana     Comment: Occassional marijuana use  . Sexual Activity: Not Currently   Other Topics Concern  . None   Social History Narrative  Additional Social History:                         Sleep: Fair  Appetite:  Fair  Current Medications: Current Facility-Administered Medications  Medication Dose Route Frequency Provider Last Rate Last Dose  . acetaminophen (TYLENOL) tablet 650 mg  650 mg Oral Q6H PRN Audery AmelJohn T Clapacs, MD      . alum & mag hydroxide-simeth (MAALOX/MYLANTA) 200-200-20 MG/5ML suspension 30 mL  30 mL Oral Q4H PRN Audery AmelJohn T Clapacs, MD      . aspirin EC tablet 81 mg  81 mg Oral Q breakfast Jolanta B Pucilowska, MD   81 mg at 02/07/16 0820  . cloNIDine (CATAPRES)  tablet 0.1 mg  0.1 mg Oral Q6H PRN Auburn BilberryShreyang Patel, MD      . haloperidol (HALDOL) tablet 5 mg  5 mg Oral QHS Jolanta B Pucilowska, MD   5 mg at 02/06/16 2223  . hydrALAZINE (APRESOLINE) tablet 100 mg  100 mg Oral Q8H Shreyang Patel, MD   100 mg at 02/07/16 1311  . hydrochlorothiazide (HYDRODIURIL) tablet 50 mg  50 mg Oral Q breakfast Jolanta B Pucilowska, MD   50 mg at 02/07/16 0819  . hydrOXYzine (ATARAX/VISTARIL) tablet 50 mg  50 mg Oral TID PRN Shari ProwsJolanta B Pucilowska, MD   50 mg at 02/04/16 1942  . isosorbide mononitrate (IMDUR) 24 hr tablet 120 mg  120 mg Oral Q breakfast Auburn BilberryShreyang Patel, MD   120 mg at 02/07/16 0819  . lisinopril (PRINIVIL,ZESTRIL) tablet 40 mg  40 mg Oral Q breakfast Jolanta B Pucilowska, MD   40 mg at 02/07/16 0818  . LORazepam (ATIVAN) tablet 2 mg  2 mg Oral Q4H PRN Audery AmelJohn T Clapacs, MD   2 mg at 02/06/16 2223  . magnesium hydroxide (MILK OF MAGNESIA) suspension 30 mL  30 mL Oral Daily PRN Audery AmelJohn T Clapacs, MD   30 mL at 01/31/16 2153  . metoprolol tartrate (LOPRESSOR) tablet 100 mg  100 mg Oral BID AC & HS Jolanta B Pucilowska, MD   100 mg at 02/07/16 0819  . OLANZapine zydis (ZYPREXA) disintegrating tablet 10 mg  10 mg Oral TID PRN Shari ProwsJolanta B Pucilowska, MD      . traZODone (DESYREL) tablet 100 mg  100 mg Oral QHS Jolanta B Pucilowska, MD   100 mg at 02/06/16 2223  . ziprasidone (GEODON) capsule 80 mg  80 mg Oral BID WC Jolanta B Pucilowska, MD   80 mg at 02/07/16 0818    Lab Results:  No results found for this or any previous visit (from the past 48 hour(s)).  Blood Alcohol level:  Lab Results  Component Value Date   [facility]   ETH <11 06/01/2013    Physical Findings: AIMS: Facial and Oral Movements Muscles of Facial Expression: None, normal Lips and Perioral Area: None, normal Jaw: None, normal Tongue: None, normal,Extremity Movements Upper (arms, wrists, hands, fingers): None, normal Lower (legs, knees, ankles, toes): None, normal, Trunk Movements Neck,  shoulders, hips: None, normal, Overall Severity Severity of abnormal movements (highest score from questions above): None, normal Incapacitation due to abnormal movements: None, normal Patient's awareness of abnormal movements (rate only patient's report): No Awareness, Dental Status Current problems with teeth and/or dentures?: No Does patient usually wear dentures?: No  CIWA:    COWS:     Musculoskeletal: Strength & Muscle Tone: within normal limits Gait & Station: normal Patient leans: N/A  Psychiatric Specialty Exam: Review  of Systems  Psychiatric/Behavioral: Positive for depression and hallucinations. The patient is nervous/anxious.   All other systems reviewed and are negative.   Blood pressure 127/85, pulse 85, temperature 98.8 F (37.1 C), temperature source Oral, resp. rate 20, height 5\' 9"  (1.753 m), weight 180.078 kg (397 lb), SpO2 98 %.Body mass index is 58.6 kg/(m^2).  General Appearance: Disheveled  Eye Contact::  Minimal  Speech:  Clear and Coherent  Volume:  Normal  Mood:  Anxious  Affect:  Congruent  Thought Process:  Disorganized  Orientation:  Full (Time, Place, and Person)  Thought Content:  Delusions and Paranoid Ideation  Suicidal Thoughts:  No  Homicidal Thoughts:  No  Memory:  Immediate;   Fair Recent;   Fair Remote;   Fair  Judgement:  Poor  Insight:  Lacking  Psychomotor Activity:  Psychomotor Retardation  Concentration:  Fair  Recall:  Fair  Fund of Knowledge:Fair  Language: Fair  Akathisia:  No  Handed:  Right  AIMS (if indicated):     Assets:  Communication Skills Desire for Improvement Housing Leisure Time Physical Health Resilience Social Support  ADL's:  Intact  Cognition: WNL  Sleep:  Number of Hours: 6.25   Treatment Plan Summary: Daily contact with patient to assess and evaluate symptoms and progress in treatment and Medication management   Mr. Lincoln is a 45 year old male with a history of delusional disorder admitted for  worsening of depression, anxiety and aborted suicide attempt.  1. Suicidal ideation. The patient is able to contract for safety in the hospital.  2. Psychosis. He was started on [place] but disliked it. We switched to Haldol and increased dose to 10 mg nightly for psychosis and started Prozac for depression. We will crosstaper Haldol for Geodon. Haldol 5 mg, Geodon 80 mg bid today.   3. History of AVM. He was diagnosed years ago, prior to onset of psychosis. He has not been compliant with his follow-up appointments. I spoke briefly with Dr. Thad Ranger, neurology, who would consider MRI when patient lass anxious/psychotic.  4. Diagnostic clarification. We ordered MMPI and psychological consult. The patient has not been able to complete testing most likely due to paranoia.   5. Anxiety. We offer hydroxyzine and Ativan for anxiety.  6. Hypertension. Medicine involvement is greatly appreciated. He is on multiple antihypertensives as guided by our internists. Blood pressure is an acceptable range today.  7. Metabolic syndrome screening. The patient is obese. His triglycerides are slightly elevated, TSH is normal. Hemoglobin A1c 6.3, prolactin 21.1.  8. Alcohol abuse. The patient has been drinking more lately, 2-3 beers a day. There were no symptoms of alcohol withdrawal.   9. Substance abuse treatment. The patient is a daily marijuana smoker. He minimizes his problems and declines treatment.   10. Smoking. Nicotine patch is available.  11. Insomnia. We started trazodone.   12. Daytime somnolence. Possibly from sleep apnea. Should we consider Provigil?  13. Agitation. Zyprexa zydis is available for bouts of paranoia/agitation.  14. Disposition. TBE. He was referred to [facility].   Patient understands that [facility]. Continue medication for now. Supportive counseling no change to medicine. Review of labs and vitals.  Mordecai Rasmussen, MD 02/07/2016, 4:34 PM

## 2016-02-08 MED ORDER — ZIPRASIDONE HCL 40 MG PO CAPS
80.0000 mg | ORAL_CAPSULE | Freq: Two times a day (BID) | ORAL | Status: DC
  Administered 2016-02-08 – 2016-02-12 (×6): 80 mg via ORAL
  Filled 2016-02-08 (×8): qty 2

## 2016-02-08 NOTE — Plan of Care (Signed)
Problem: Alteration in mood Goal: STG-Patient is able to discuss feelings and issues (Patient is able to discuss feelings and issues leading to depression)  Outcome: Progressing Pt spent time talking to writer about current emotions and thoughts.

## 2016-02-08 NOTE — [facility] Group Notes (Signed)
[facility] Group Notes:  (Nursing/MHT/Case Management/Adjunct)  Date:  02/08/2016  Time:  10:20 AM  Type of Therapy:  goal setting   Participation Level:  Did Not Attend  Twanna Hymanda C Orlie Cundari 02/08/2016, 10:20 AM

## 2016-02-08 NOTE — Progress Notes (Signed)
[facility] MD Progress Note  02/08/2016 1:57 PM Timothy Jackson  MRN:  [id number]  Subjective:  Timothy Jackson has a 3 year history of paranoia that caused him to stay home. He now accepts medications but did not respond to Haldol much. Haldo lwas chosen to improve compliance with long acting form. On Wednesday, he had a major psychotic episode on the unit with severe paranoia and agitation. He was given Zyprexa and Ativan with success however his high weight precludes him from Zyprexa use. I started Geodon.  Follow-up Sunday the 14th. Found patient in bed once again. He seemed anxious but was not quite as paranoid as yesterday. He wanted to talk with me about how he is too sedated in the morning and wants to change his medicine. He said earlier this morning he felt awake but after breakfast he just felt like going back to bed again. He denies any suicidal thoughts but still appears to be having trouble getting his thinking together. He also asked me if it would be possible for him to be discharged home with his family. 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 HTN (hypertension) [I10] 01/29/2016  . 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 Cannabis use disorder, moderate, dependence (HCC) [F12.20] 01/28/2016  . Alcohol use disorder, moderate, dependence (HCC) [F10.20] 01/28/2016  . Suicidal ideation [R45.851] 01/28/2016  . Tobacco use disorder [F17.200] 01/28/2016  . Generalized anxiety disorder [F41.1] 05/18/2013   Total Time spent with patient: 20 minutes  Past Psychiatric History: depression, psychosis.  Past Medical History:  Past Medical History  Diagnosis Date  . Hypertension   . Deaf     Right ear  . History of cardiac cath 02/24/2010      LVH, EF of 50-55%, LAD has 10-15% proximal stenosis, by Dr. Sharyn Lull  . Anxiety, generalized   . Erectile dysfunction   . Delusional disorder (HCC)   . 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 Cardiac catheterization  01/2010    "essentially negative"  . Fracture surgery Left     ORIF left arm as a child   Family History:  Family History  Problem Relation Age of Onset  . Hypertension Father   . Coronary artery disease Maternal Grandmother   . Hypertension Maternal Grandmother   . Diabetes Maternal Grandmother   . Coronary artery disease Maternal Uncle   . Hypertension Maternal Uncle   . Diabetes Maternal Aunt   . Leukemia Maternal Uncle    Family Psychiatric  History: See H&P. Social History:  History  Alcohol Use  . 0.0 oz/week    Comment: daily     History  Drug Use  . Yes  . Special: Marijuana    Comment: Occassional marijuana use    Social History   Social History  . Marital Status: Single    Spouse Name: N/A  . Number of Children: N/A  . Years of Education: N/A   Occupational History  . desk job     Scientist, water quality   Social History Main Topics  . Smoking status: Current Some Day Smoker    Types: Cigarettes  . Smokeless tobacco: None  . Alcohol Use: 0.0 oz/week     Comment: daily  . Drug Use: Yes  Special: Marijuana     Comment: Occassional marijuana use  . Sexual Activity: Not Currently   Other Topics Concern  . None   Social History Narrative   Additional Social History:                         Sleep: Fair  Appetite:  Fair  Current Medications: Current Facility-Administered Medications  Medication Dose Route Frequency Provider Last Rate Last Dose  . acetaminophen (TYLENOL) tablet 650 mg  650 mg Oral Q6H PRN Audery Amel, MD      . alum & mag hydroxide-simeth (MAALOX/MYLANTA) 200-200-20 MG/5ML suspension 30 mL  30 mL Oral Q4H PRN Audery Amel, MD      .  aspirin EC tablet 81 mg  81 mg Oral Q breakfast Jolanta B Pucilowska, MD   81 mg at 02/08/16 0812  . cloNIDine (CATAPRES) tablet 0.1 mg  0.1 mg Oral Q6H PRN Auburn Bilberry, MD      . haloperidol (HALDOL) tablet 5 mg  5 mg Oral QHS Jolanta B Pucilowska, MD   5 mg at 02/07/16 2227  . hydrALAZINE (APRESOLINE) tablet 100 mg  100 mg Oral Q8H Auburn Bilberry, MD   100 mg at 02/08/16 0651  . hydrochlorothiazide (HYDRODIURIL) tablet 50 mg  50 mg Oral Q breakfast Jolanta B Pucilowska, MD   50 mg at 02/08/16 0812  . hydrOXYzine (ATARAX/VISTARIL) tablet 50 mg  50 mg Oral TID PRN Shari Prows, MD   50 mg at 02/04/16 1942  . isosorbide mononitrate (IMDUR) 24 hr tablet 120 mg  120 mg Oral Q breakfast Auburn Bilberry, MD   120 mg at 02/08/16 1610  . lisinopril (PRINIVIL,ZESTRIL) tablet 40 mg  40 mg Oral Q breakfast Shari Prows, MD   40 mg at 02/08/16 0812  . LORazepam (ATIVAN) tablet 2 mg  2 mg Oral Q4H PRN Audery Amel, MD   2 mg at 02/07/16 2227  . magnesium hydroxide (MILK OF MAGNESIA) suspension 30 mL  30 mL Oral Daily PRN Audery Amel, MD   30 mL at 01/31/16 2153  . metoprolol tartrate (LOPRESSOR) tablet 100 mg  100 mg Oral BID AC & HS Jolanta B Pucilowska, MD   100 mg at 02/08/16 0811  . OLANZapine zydis (ZYPREXA) disintegrating tablet 10 mg  10 mg Oral TID PRN Shari Prows, MD      . traZODone (DESYREL) tablet 100 mg  100 mg Oral QHS Jolanta B Pucilowska, MD   100 mg at 02/07/16 2227  . ziprasidone (GEODON) capsule 80 mg  80 mg Oral BID AC Audery Amel, MD        Lab Results:  No results found for this or any previous visit (from the past 48 hour(s)).  Blood Alcohol level:  Lab Results  Component Value Date   [facility] <5 01/28/2016   ETH <11 06/01/2013    Physical Findings: AIMS: Facial and Oral Movements Muscles of Facial Expression: None, normal Lips and Perioral Area: None, normal Jaw: None, normal Tongue: None, normal,Extremity Movements Upper (arms, wrists, hands,  fingers): None, normal Lower (legs, knees, ankles, toes): None, normal, Trunk Movements Neck, shoulders, hips: None, normal, Overall Severity Severity of abnormal movements (highest score from questions above): None, normal Incapacitation due to abnormal movements: None, normal Patient's awareness of abnormal movements (rate only patient's report): No Awareness, Dental Status Current problems with teeth and/or dentures?: No Does patient usually wear dentures?: No  CIWA:  COWS:     Musculoskeletal: Strength & Muscle Tone: within normal limits Gait & Station: normal Patient leans: N/A  Psychiatric Specialty Exam: Review of Systems  Psychiatric/Behavioral: Positive for depression and hallucinations. The patient is nervous/anxious.   All other systems reviewed and are negative.   Blood pressure 133/88, pulse 88, temperature 97.6 F (36.4 C), temperature source Oral, resp. rate 20, height  (1.753 m), weight 180.078 kg (397 lb), SpO2 100 %.Body mass index is 58.6 kg/(m^2).  General Appearance: Disheveled  Eye Contact::  Minimal  Speech:  Clear and Coherent  Volume:  Normal  Mood:  Anxious  Affect:  Congruent  Thought Process:  Disorganized  Orientation:  Full (Time, Place, and Person)  Thought Content:  Delusions and Paranoid Ideation  Suicidal Thoughts:  No  Homicidal Thoughts:  No  Memory:  Immediate;   Fair Recent;   Fair Remote;   Fair  Judgement:  Poor  Insight:  Lacking  Psychomotor Activity:  Psychomotor Retardation  Concentration:  Fair  Recall:  Fair  Fund of Knowledge:Fair  Language: Fair  Akathisia:  No  Handed:  Right  AIMS (if indicated):     Assets:  Communication Skills Desire for Improvement Housing Leisure Time Physical Health Resilience Social Support  ADL's:  Intact  Cognition: WNL  Sleep:  Number of Hours: 6.25   Treatment Plan Summary: Daily contact with patient to assess and evaluate symptoms and progress in treatment and Medication  management   Timothy Jackson is a 45 year old male with a history of delusional disorder admitted for worsening of depression, anxiety and aborted suicide attempt.  1. Suicidal ideation. The patient is able to contract for safety in the hospital.  2. Psychosis. He was started on [place] but disliked it. We switched to Haldol and increased dose to 10 mg nightly for psychosis and started Prozac for depression. We will crosstaper Haldol for Geodon. Haldol 5 mg, Geodon 80 mg bid today.   3. History of AVM. He was diagnosed years ago, prior to onset of psychosis. He has not been compliant with his follow-up appointments. I spoke briefly with Dr. Thad Ranger, neurology, who would consider MRI when patient lass anxious/psychotic.  4. Diagnostic clarification. We ordered MMPI and psychological consult. The patient has not been able to complete testing most likely due to paranoia.   5. Anxiety. We offer hydroxyzine and Ativan for anxiety.  6. Hypertension. Medicine involvement is greatly appreciated. He is on multiple antihypertensives as guided by our internists. Blood pressure is an acceptable range today.  7. Metabolic syndrome screening. The patient is obese. His triglycerides are slightly elevated, TSH is normal. Hemoglobin A1c 6.3, prolactin 21.1.  8. Alcohol abuse. The patient has been drinking more lately, 2-3 beers a day. There were no symptoms of alcohol withdrawal.   9. Substance abuse treatment. The patient is a daily marijuana smoker. He minimizes his problems and declines treatment.   10. Smoking. Nicotine patch is available.  11. Insomnia. We started trazodone.   12. Daytime somnolence. Possibly from sleep apnea. Should we consider Provigil?  13. Agitation. Zyprexa zydis is available for bouts of paranoia/agitation.  14. Disposition. TBE. He was referred to [facility].   I made a change in his medicine dosage by changing his Geodon from 80 mg morning and night to 80 mg lunch and supper. This  may give him more time in the morning to wake up and not be oversedated. I told him I had no idea whether going home was an  option but that the current plan appeared to be referral [facility]. Encouraged agent to discuss this with his primary treatment team this week.  Mordecai RasmussenJohn Clapacs, MD 02/08/2016, 1:57 PM

## 2016-02-08 NOTE — Plan of Care (Signed)
Problem: Diagnosis: Increased Risk For Suicide Attempt Goal: LTG-Patient Will Show Positive Response to Medication LTG (by discharge) : Patient will show positive response to medication and will participate in the development of the discharge plan.  Outcome: Progressing Pt in dayroom socializing with peers.

## 2016-02-08 NOTE — Progress Notes (Signed)
D: Observed pt in room lying in bed. Patient alert and oriented x4. Patient denies SI/HI/AVH. Pt affect is blunted and sad. Pt appeared more sedated than previous night, and pt did indicate that he felt a lot more tired and just laid in bed, but stated "If these is what I need to be better, I'm ok with it." Pt was apologetic about possibly offending or upsetting staff in the past, and talked about being sad that he missed his family. Pt rated depression 2/10 and anxiety 3/10. Pt talked about having very vivid dreams, but stated "they're not nightmares." Pt mentioned concerns about taking antipsychotics.  A: Offered active listening and support. Provided therapeutic communication. Administered scheduled medications. Gave Ativan prn for anxiety. Held metoprolol, as pulse rate was 63. Encouraged pt to discuss medication concerns with attending provider.  R: Pt later got out of bed, went into dayroom, and was very pleasant and talkative. Pt appeared much less sedated. Pt medication compliant. Will continue Q15 min. checks. Safety maintained.

## 2016-02-08 NOTE — [facility] Group Notes (Signed)
[facility] Group Notes:  (Nursing/MHT/Case Management/Adjunct)  Date:  02/08/2016  Time:  12:09 AM  Type of Therapy:  Group Therapy  Participation Level:  Did Not Attend   Summary of Progress/Problems:  Keeghan Bialy Imani Leroy Pettway 02/08/2016, 12:09 AM 

## 2016-02-08 NOTE — Progress Notes (Signed)
No negative behaviors thus far. Visible in mileu. Socializing with peers. Med compliant. Interacts appropriately with staff and peers. Denies SI, HI, AVH. Encouragement and support offered. Pt receptive and remains safe on unit with q 15 min checks.

## 2016-02-08 NOTE — [facility] Group Notes (Signed)
[facility] LCSW Group Therapy  02/08/2016 2:05 PM  Type of Therapy:  Group Therapy  Participation Level:  Did Not Attend  Modes of Intervention:  Discussion, Education, Socialization and Support  Summary of Progress/Problems: Pt will identify unhealthy thoughts and how they impact their emotions and behavior. Pt will be encouraged to discuss these thoughts, emotions and behaviors with the group.   Ulanda Tackett L Chancey Cullinane MSW, LCSWA  02/08/2016, 2:05 PM   

## 2016-02-09 NOTE — Progress Notes (Signed)
Pt pleasant and cooperative today. Stayed in room most of day, reports being sleepy with decreased appetite. Denies hallucinations. Med compliant except for 5pm meds. Pt remains in bed at present.Pt remains safe on unit with q 15 min checks.

## 2016-02-09 NOTE — Plan of Care (Signed)
Problem: Diagnosis: Increased Risk For Suicide Attempt Goal: STG-Patient Will Comply With Medication Regime Outcome: Progressing Pt med compliant with all medications

## 2016-02-09 NOTE — Progress Notes (Signed)
D: Pt remains isolative in his room, only coming out for medications. Pt appears less sedated. He discusses "vivid dreams" he has been having during admission, and states "they're good though, not nightmares." He denies SI/HI/AVH, although he reports "earlier today I saw some lady dressed like you standing over me smiling. When I blinked she was gone. I think I'm just imagining it because you guys always ask me that question." He rates his depression and anxiety 5/10, and requests PRN medication. Denies pain. A: Emotional support and encouragement provided. Medications administered with education. q15 minute safety checks maintained. R: Pt remains free from harm. Will continue to monitor.

## 2016-02-09 NOTE — Plan of Care (Signed)
Problem: Alteration in mood Goal: STG-Patient is able to discuss feelings and issues (Patient is able to discuss feelings and issues leading to depression)  Outcome: Progressing Pt discusses current feelings with Clinical research associatewriter regarding his family and his admission at the hospital.  Problem: Alteration in thought process Goal: LTG-Patient verbalizes understanding importance med regimen (Patient verbalizes understanding of importance of medication regimen and need to continue outpatient care.)  Outcome: Progressing Pt asks questions appropriately regarding medication administration and verbalizes an understanding of medications.

## 2016-02-09 NOTE — [facility] Group Notes (Signed)
[facility] Group Notes:  (Nursing/MHT/Case Management/Adjunct)  Date:  02/09/2016  Time:  4:06 PM  Type of Therapy:  Psychoeducational Skills  Participation Level:  Did Not Attend]  Timothy Jackson 02/09/2016, 4:06 PM 

## 2016-02-09 NOTE — Progress Notes (Signed)
Recreation Therapy Notes  Date: 05.15.17 Time: 9:30 am Location: Craft Room  Group Topic: Self-expression  Goal Area(s) Addresses:  Patient will identify one color per emotion listed on wheel. Patient will verbalize one emotion experienced during session. Patient will be educated on other forms of self-expression.  Behavioral Response: Did not attend  Intervention: Emotion Wheel  Activity: Patients were given an emotion wheel worksheet and instructed to pick a color for each emotion listed on the wheel.   Education: LRT educated patients on other forms of self-expression.  Education Outcome: Patient did not attend group.   Clinical Observations/Feedback: Patient did not attend group.  Jacquelynn CreeGreene,Wilkes Potvin M, LRT/CTRS 02/09/2016 10:06 AM

## 2016-02-09 NOTE — Progress Notes (Addendum)
[facility] MD Progress Note  02/09/2016 1:23 PM Timothy Jackson  MRN:  [id number]  Subjective:  Timothy Jackson reports improvement. He is no longer preoccupied with his paranoid delusions. Over the weekend, he spoke with his estranged family, including the mother, 47 year old son and his mother. They provided much needed support and some ideas for discharge planning. There are no somatic complaints. Appetite decreased possibly from Geodon. Vital signs are stable.   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 HTN (hypertension) [I10] 01/29/2016  . 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 Cannabis use disorder, moderate, dependence (HCC) [F12.20] 01/28/2016  . Alcohol use disorder, moderate, dependence (HCC) [F10.20] 01/28/2016  . Suicidal ideation [R45.851] 01/28/2016  . Tobacco use disorder [F17.200] 01/28/2016  . Generalized anxiety disorder [F41.1] 05/18/2013   Total Time spent with patient: 20 minutes  Past Psychiatric History: depression, psychosis.  Past Medical History:  Past Medical History  Diagnosis Date  . Hypertension   . Deaf     Right ear  . History of cardiac cath 02/24/2010     LVH, EF of 50-55%, LAD has 10-15% proximal stenosis, by Dr. Sharyn Lull  . Anxiety, generalized   . Erectile dysfunction   . Delusional disorder (HCC)   . 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 Cardiac catheterization  01/2010    "essentially negative"  . Fracture surgery Left     ORIF left arm as a child   Family History:  Family History  Problem Relation Age of Onset  . Hypertension Father   . Coronary artery disease Maternal Grandmother   . Hypertension Maternal Grandmother    . Diabetes Maternal Grandmother   . Coronary artery disease Maternal Uncle   . Hypertension Maternal Uncle   . Diabetes Maternal Aunt   . Leukemia Maternal Uncle    Family Psychiatric  History: see H&P. Social History:  History  Alcohol Use  . 0.0 oz/week    Comment: daily     History  Drug Use  . Yes  . Special: Marijuana    Comment: Occassional marijuana use    Social History   Social History  . Marital Status: Single    Spouse Name: N/A  . Number of Children: N/A  . Years of Education: N/A   Occupational History  . desk job     Scientist, water quality   Social History Main Topics  . Smoking status: Current Some Day Smoker    Types: Cigarettes  . Smokeless tobacco: None  . Alcohol Use: 0.0 oz/week     Comment: daily  . Drug Use: Yes    Special: Marijuana     Comment: Occassional marijuana use  . Sexual Activity: Not Currently   Other Topics Concern  . None   Social History Narrative   Additional Social History:                         Sleep: Fair  Appetite:  Poor  Current Medications: Current Facility-Administered Medications  Medication Dose Route Frequency Provider Last Rate Last Dose  . acetaminophen (TYLENOL) tablet 650 mg  650 mg Oral Q6H PRN Audery Amel, MD      . alum & mag hydroxide-simeth (MAALOX/MYLANTA) 200-200-20  MG/5ML suspension 30 mL  30 mL Oral Q4H PRN Audery AmelJohn T Clapacs, MD      . aspirin EC tablet 81 mg  81 mg Oral Q breakfast Jolanta B Pucilowska, MD   81 mg at 02/09/16 0800  . cloNIDine (CATAPRES) tablet 0.1 mg  0.1 mg Oral Q6H PRN Auburn BilberryShreyang Patel, MD      . haloperidol (HALDOL) tablet 5 mg  5 mg Oral QHS Jolanta B Pucilowska, MD   5 mg at 02/08/16 2115  . hydrALAZINE (APRESOLINE) tablet 100 mg  100 mg Oral Q8H Auburn BilberryShreyang Patel, MD   100 mg at 02/09/16 1305  . hydrochlorothiazide (HYDRODIURIL) tablet 50 mg  50 mg Oral Q breakfast Jolanta B Pucilowska, MD   50 mg at 02/09/16 0759  . hydrOXYzine (ATARAX/VISTARIL) tablet 50 mg  50 mg  Oral TID PRN Shari ProwsJolanta B Pucilowska, MD   50 mg at 02/08/16 2355  . isosorbide mononitrate (IMDUR) 24 hr tablet 120 mg  120 mg Oral Q breakfast Auburn BilberryShreyang Patel, MD   120 mg at 02/09/16 0759  . lisinopril (PRINIVIL,ZESTRIL) tablet 40 mg  40 mg Oral Q breakfast Jolanta B Pucilowska, MD   40 mg at 02/09/16 0800  . LORazepam (ATIVAN) tablet 2 mg  2 mg Oral Q4H PRN Audery AmelJohn T Clapacs, MD   2 mg at 02/08/16 2118  . magnesium hydroxide (MILK OF MAGNESIA) suspension 30 mL  30 mL Oral Daily PRN Audery AmelJohn T Clapacs, MD   30 mL at 01/31/16 2153  . metoprolol tartrate (LOPRESSOR) tablet 100 mg  100 mg Oral BID AC & HS Jolanta B Pucilowska, MD   100 mg at 02/09/16 0800  . OLANZapine zydis (ZYPREXA) disintegrating tablet 10 mg  10 mg Oral TID PRN Shari ProwsJolanta B Pucilowska, MD      . traZODone (DESYREL) tablet 100 mg  100 mg Oral QHS Jolanta B Pucilowska, MD   100 mg at 02/08/16 2116  . ziprasidone (GEODON) capsule 80 mg  80 mg Oral BID AC Audery AmelJohn T Clapacs, MD   80 mg at 02/09/16 1202    Lab Results: No results found for this or any previous visit (from the past 48 hour(s)).  Blood Alcohol level:  Lab Results  Component Value Date   [facility] 01/28/2016   ETH <11 06/01/2013    Physical Findings: AIMS: Facial and Oral Movements Muscles of Facial Expression: None, normal Lips and Perioral Area: None, normal Jaw: None, normal Tongue: None, normal,Extremity Movements Upper (arms, wrists, hands, fingers): None, normal Lower (legs, knees, ankles, toes): None, normal, Trunk Movements Neck, shoulders, hips: None, normal, Overall Severity Severity of abnormal movements (highest score from questions above): None, normal Incapacitation due to abnormal movements: None, normal Patient's awareness of abnormal movements (rate only patient's report): No Awareness, Dental Status Current problems with teeth and/or dentures?: No Does patient usually wear dentures?: No  CIWA:    COWS:     Musculoskeletal: Strength & Muscle Tone:  within normal limits Gait & Station: normal Patient leans: N/A  Psychiatric Specialty Exam: Review of Systems  All other systems reviewed and are negative.   Blood pressure 152/94, pulse 83, temperature 98.5 F (36.9 C), temperature source Oral, resp. rate 20, height 5\' 9"  (1.753 m), weight 180.078 kg (397 lb), SpO2 100 %.Body mass index is 58.6 kg/(m^2).  General Appearance: Fairly Groomed  Patent attorneyye Contact::  Good  Speech:  Clear and Coherent  Volume:  Normal  Mood:  Euthymic  Affect:  Appropriate  Thought Process:  Goal  Directed  Orientation:  Full (Time, Place, and Person)  Thought Content:  Delusions and Paranoid Ideation  Suicidal Thoughts:  No  Homicidal Thoughts:  No  Memory:  Immediate;   Fair Recent;   Fair Remote;   Fair  Judgement:  Poor  Insight:  Lacking  Psychomotor Activity:  Psychomotor Retardation  Concentration:  Fair  Recall:  [employer] of Knowledge:Fair  Language: Fair  Akathisia:  No  Handed:  Right  AIMS (if indicated):     Assets:  Communication Skills Desire for Improvement Physical Health Resilience Social Support  ADL's:  Intact  Cognition: WNL  Sleep:  Number of Hours: 5.75   Treatment Plan Summary: Daily contact with patient to assess and evaluate symptoms and progress in treatment and Medication management   Timothy Jackson is a 45 year old male with a history of delusional disorder admitted for worsening of depression, anxiety and aborted suicide attempt.  1. Suicidal ideation. The patient is able to contract for safety in the hospital.  2. Psychosis. He was started on [place] but disliked it. We switched to Haldol and increased dose to 10 mg nightly for psychosis and started Prozac for depression. We crosstapered Haldol for Geodon.   3. History of AVM. He was diagnosed years ago, prior to onset of psychosis. He has not been compliant with his follow-up appointments. I spoke briefly with Dr. Thad Ranger, neurology, who would consider MRI when  patient lass anxious/psychotic.  4. Diagnostic clarification. We ordered MMPI and psychological consult. The patient has not been able to complete testing most likely due to paranoia.   5. Anxiety. We offer hydroxyzine and Ativan for anxiety.  6. Hypertension. Medicine involvement is greatly appreciated. He is on multiple antihypertensives as guided by our internists. Blood pressure is an acceptable range today.  7. Metabolic syndrome screening. The patient is obese. His triglycerides are slightly elevated, TSH is normal. Hemoglobin A1c 6.3, prolactin 21.1.  8. Alcohol abuse. The patient has been drinking more lately, 2-3 beers a day. There were no symptoms of alcohol withdrawal.   9. Substance abuse treatment. The patient is a daily marijuana smoker. He minimizes his problems and declines treatment.   10. Smoking. Nicotine patch is available.  11. Insomnia. We started trazodone.   12. Daytime somnolence. Possibly from sleep apnea. Should we consider Provigil?  13. Agitation. Zyprexa zydis is available for bouts of paranoia/agitation.  14. Disposition. TBE. He was referred to [facility].    I certify that the services received since the previous certification/recertification were and continue to be medically necessary as the treatment provided can be reasonably expected to improve the patient's condition; the medical record documents that the services furnished were intensive treatment services or their equivalent services, and this patient continues to need, on a daily basis, active treatment furnished directly by or requiring the supervision of inpatient psychiatric personnel.  Kristine Linea, MD 02/09/2016, 1:23 PM

## 2016-02-10 MED ORDER — TRAZODONE HCL 50 MG PO TABS
150.0000 mg | ORAL_TABLET | Freq: Every day | ORAL | Status: DC
  Administered 2016-02-10 – 2016-02-11 (×2): 150 mg via ORAL
  Filled 2016-02-10 (×2): qty 1

## 2016-02-10 NOTE — Progress Notes (Signed)
[facility] MD Progress Note  02/10/2016 12:55 PM Timothy Jackson  MRN:  [id number]  Subjective:  Timothy Jackson for the first time is out of the room interacting with peers and staff. He participated in his first group today. He seems much more relaxed and no longer preoccupied with delusional content. He no longer believes that he belongs in a state facility and is thinking about getting back with his family. He tolerates medications well. There are no somatic complaints.  Timothy Jackson met with treatment team today and was able to participate in discharge planning. He now wants to continue on medications and follow-up with a psychiatrist. He intends to move back with his family at least initially and then look for a half way or [place] to address substance abuse problems. We will monitor for the patient for a few days to see if this improvement continues and hopefully discharge. At the end of the week.   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 HTN (hypertension) [I10] 01/29/2016  . 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 Cannabis use disorder, moderate, dependence ([place]) [F12.20] 01/28/2016  . Alcohol use disorder, moderate, dependence ([place]) [F10.20] 01/28/2016  . Suicidal ideation [R45.851] 01/28/2016  . Tobacco use disorder [F17.200] 01/28/2016  . Generalized anxiety disorder [F41.1] 05/18/2013   Total Time spent with patient: 20 minutes  Past Psychiatric History: Depression, psychosis or substance abuse.,   Past Medical History:  Past Medical History  Diagnosis Date  . Hypertension   . Deaf     Right ear  . History of cardiac cath 02/24/2010     LVH, EF of 50-55%, LAD has 10-15% proximal stenosis, by Dr. Terrence Dupont  . Anxiety,  generalized   . Erectile dysfunction   . Delusional disorder ([place])   . 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 Cardiac catheterization  01/2010    "essentially negative"  . Fracture surgery Left     ORIF left arm as a child   Family History:  Family History  Problem Relation Age of Onset  . Hypertension Father   . Coronary artery disease Maternal Grandmother   . Hypertension Maternal Grandmother   . Diabetes Maternal Grandmother   . Coronary artery disease Maternal Uncle   . Hypertension Maternal Uncle   . Diabetes Maternal Aunt   . Leukemia Maternal Uncle    Family Psychiatric  History: See H&P.al History:  History  Alcohol Use  . 0.0 oz/week    Comment: daily     History  Drug Use  . Yes  . Special: Marijuana    Comment: Occassional marijuana use    Social History   Social History  . Marital Status: Single    Spouse Name: N/A  . Number of Children: N/A  . Years of Education: N/A   Occupational History  . desk job     Sports administrator   Social History Main Topics  . Smoking status: Current Some Day Smoker    Types: Cigarettes  . Smokeless tobacco: None  . Alcohol Use: 0.0 oz/week     Comment: daily  . Drug Use: Yes    Special: Marijuana     Comment: Occassional marijuana use  . Sexual Activity: Not Currently  Other Topics Concern  . None   Social History Narrative   Additional Social History:                         Sleep: Fair  Appetite:  Fair  Current Medications: Current Facility-Administered Medications  Medication Dose Route Frequency Provider Last Rate Last Dose  . acetaminophen (TYLENOL) tablet 650 mg  650 mg Oral Q6H PRN Gonzella Lex, MD      . alum & mag hydroxide-simeth (MAALOX/MYLANTA) 200-200-20 MG/5ML suspension 30 mL  30 mL Oral Q4H PRN Gonzella Lex, MD      . aspirin EC tablet 81 mg  81 mg Oral Q breakfast Jolanta B Pucilowska, MD   81 mg at 02/10/16  0852  . cloNIDine (CATAPRES) tablet 0.1 mg  0.1 mg Oral Q6H PRN Dustin Flock, MD      . hydrALAZINE (APRESOLINE) tablet 100 mg  100 mg Oral Q8H Dustin Flock, MD   100 mg at 02/10/16 0649  . hydrochlorothiazide (HYDRODIURIL) tablet 50 mg  50 mg Oral Q breakfast Jolanta B Pucilowska, MD   50 mg at 02/10/16 0852  . hydrOXYzine (ATARAX/VISTARIL) tablet 50 mg  50 mg Oral TID PRN Clovis Fredrickson, MD   50 mg at 02/10/16 0010  . isosorbide mononitrate (IMDUR) 24 hr tablet 120 mg  120 mg Oral Q breakfast Dustin Flock, MD   120 mg at 02/10/16 0852  . lisinopril (PRINIVIL,ZESTRIL) tablet 40 mg  40 mg Oral Q breakfast Clovis Fredrickson, MD   40 mg at 02/10/16 0852  . LORazepam (ATIVAN) tablet 2 mg  2 mg Oral Q4H PRN Gonzella Lex, MD   2 mg at 02/08/16 2118  . magnesium hydroxide (MILK OF MAGNESIA) suspension 30 mL  30 mL Oral Daily PRN Gonzella Lex, MD   30 mL at 01/31/16 2153  . metoprolol tartrate (LOPRESSOR) tablet 100 mg  100 mg Oral BID AC & HS Jolanta B Pucilowska, MD   100 mg at 02/10/16 0853  . OLANZapine zydis (ZYPREXA) disintegrating tablet 10 mg  10 mg Oral TID PRN Clovis Fredrickson, MD      . traZODone (DESYREL) tablet 100 mg  100 mg Oral QHS Jolanta B Pucilowska, MD   100 mg at 02/09/16 2154  . ziprasidone (GEODON) capsule 80 mg  80 mg Oral BID AC Gonzella Lex, MD   80 mg at 02/09/16 1202    Lab Results: No results found for this or any previous visit (from the past 48 hour(s)).  Blood Alcohol level:  Lab Results  Component Value Date   [facility] <5 01/28/2016   ETH <11 06/01/2013    Physical Findings: AIMS: Facial and Oral Movements Muscles of Facial Expression: None, normal Lips and Perioral Area: None, normal Jaw: None, normal Tongue: None, normal,Extremity Movements Upper (arms, wrists, hands, fingers): None, normal Lower (legs, knees, ankles, toes): None, normal, Trunk Movements Neck, shoulders, hips: None, normal, Overall Severity Severity of abnormal  movements (highest score from questions above): None, normal Incapacitation due to abnormal movements: None, normal Patient's awareness of abnormal movements (rate only patient's report): No Awareness, Dental Status Current problems with teeth and/or dentures?: No Does patient usually wear dentures?: No  CIWA:    COWS:     Musculoskeletal: Strength & Muscle Tone: within normal limits Gait & Station: normal Patient leans: N/A  Psychiatric Specialty Exam: Review of Systems  Psychiatric/Behavioral: Positive for depression and hallucinations.  All  other systems reviewed and are negative.   Blood pressure 139/88, pulse 68, temperature 98.2 F (36.8 C), temperature source Oral, resp. rate 20, height 5' 9"  (1.753 m), weight 180.078 kg (397 lb), SpO2 100 %.Body mass index is 58.6 kg/(m^2).  General Appearance: Fairly Groomed  Engineer, water::  Good  Speech:  Clear and Coherent  Volume:  Normal  Mood:  Anxious  Affect:  Appropriate  Thought Process:  Goal Directed  Orientation:  Full (Time, Place, and Person)  Thought Content:  Delusions and Paranoid Ideation  Suicidal Thoughts:  No  Homicidal Thoughts:  No  Memory:  Immediate;   Fair Recent;   Fair Remote;   Fair  Judgement:  Poor  Insight:  Shallow  Psychomotor Activity:  Decreased  Concentration:  Fair  Recall:  [place]: Fair  Akathisia:  No  Handed:  Right  AIMS (if indicated):     Assets:  Communication Skills Desire for Improvement Financial Resources/Insurance Physical Health Resilience Social Support  ADL's:  Intact  Cognition: WNL  Sleep:  Number of Hours: 4   Treatment Plan Summary: Daily contact with patient to assess and evaluate symptoms and progress in treatment and Medication management   Timothy Jackson is a 45 year old male with a history of delusional disorder admitted for worsening of depression, anxiety and aborted suicide attempt.  1. Suicidal ideation. The patient is able  to contract for safety in the hospital.  2. Psychosis. He was started on [place] but disliked it. We switched to Haldol and increased dose to 10 mg nightly for psychosis and started Prozac for depression. We crosstapered Haldol for Geodon.   3. History of AVM. He was diagnosed years ago, prior to onset of psychosis. He has not been compliant with his follow-up appointments. I spoke briefly with Dr. Doy Mince, neurology, who would consider MRI when patient lass anxious/psychotic.  4. Diagnostic clarification. We ordered MMPI and psychological consult. The patient has not been able to complete testing most likely due to paranoia.   5. Anxiety. We offer hydroxyzine and Ativan for anxiety.  6. Hypertension. Medicine involvement is greatly appreciated. He is on multiple antihypertensives as guided by our internists. Blood pressure is an acceptable range today.  7. Metabolic syndrome screening. The patient is obese. His triglycerides are slightly elevated, TSH is normal. Hemoglobin A1c 6.3, prolactin 21.1.  8. Alcohol abuse. The patient has been drinking more lately, 2-3 beers a day. There were no symptoms of alcohol withdrawal.   9. Substance abuse treatment. The patient is a daily marijuana smoker. He minimizes his problems and declines treatment.   10. Smoking. Nicotine patch is available.  11. Insomnia. We started trazodone but the patient slept 4 hours only. He has been sleeping during the day yesterday. Will increase Trazodone.  12. Daytime somnolence. Possibly from sleep apnea. Should we consider Provigil?  13. Agitation. Zyprexa zydis is available for bouts of paranoia/agitation.  14. Disposition. TBE. He was referred to [facility].   Orson Slick, MD 02/10/2016, 12:55 PM

## 2016-02-10 NOTE — [facility] Group Notes (Signed)
[facility] Group Notes:  (Nursing/MHT/Case Management/Adjunct)  Date:  02/10/2016  Time:  2:24 PM  Type of Therapy:  Psychoeducational Skills  Participation Level:  Active  Participation Quality:  Appropriate  Affect:  Appropriate  Cognitive:  Appropriate  Insight:  Appropriate  Engagement in Group:  Engaged  Modes of Intervention:  Discussion and Education  Summary of Progress/Problems:  [name] 02/10/2016, 2:24 PM

## 2016-02-10 NOTE — Progress Notes (Signed)
Recreation Therapy Notes  Date: 05.16.17 Time: 9:30 am Location: Craft Room  Group Topic: Coping Skills  Goal Area(s) Addresses:  Patient will participate in coping skill. Patient will verbalize an emotion experienced during group.  Behavioral Response: Attentive, Interactive  Intervention: Coloring  Activity: Patients colored coloring sheets and were encouraged to think about the emotions they were feeling while they were coloring.  Education: LRT educated patients on healthy coping skills.  Education Outcome: In group clarification offered.   Clinical Observations/Feedback: Patient attended group and colored coloring sheet. Patient became tearful, but did not know why. Patient contributed to group discussion by stating what emotion he felt. Patient interacted with peer.  Jacquelynn CreeGreene,Carles Florea M, LRT/CTRS 02/10/2016 10:28 AM

## 2016-02-10 NOTE — [facility] Group Notes (Signed)
[facility]   02/10/2016 10:47 AM  Participation Quality:   Patient arrived 20 minutes late to group but participated in group introducing himself and sharing his SMART goal is to "go to more groups and find better ways to manage my anxiety and depression". Patient received a daily workbook for Tuesday.  Mood/Affect:  Blunted  Depression Rating:  0  Anxiety Rating:  7  Thoughts of Suicide:  No Will you contract for safety?   Yes  Current AVH:  No  Plan for Discharge/Comments:  Return home but needs follow up care identified per patient  Transportation Means: patient reports he has family for transportation  Supports: family support   Lulu RidingIngle, Timothy Jackson, MSW, LCSW

## 2016-02-10 NOTE — Progress Notes (Addendum)
Patient with depressed affect, cooperative behavior with meals, meds and plan of care. No SI/HI at this time. Patient states he is not sleeping well at night. Encouraged patient to stay out of bed and attend therapy groups today, to increase quality of sleep at night. Patient increase activity this am and noted to be speaking with male staff in hall and attends a therapy group. Poor adls, encourage to shower and wash clothes with little effect at this time. No SI/HI at this time. Safety maintained. Patient with slightly brighter affect, today. Attended all therapy groups this afternoon.
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
## 2016-02-10 NOTE — Plan of Care (Signed)
Problem: Alteration in mood Goal: LTG-Pt's behavior demonstrates decreased signs of depression (Patient's behavior demonstrates decreased signs of depression to the point the patient is safe to return home and continue treatment in an outpatient setting)  Outcome: Progressing Pt affect is bright this evening. He denies SI and rates depression 2/10.  Problem: Alteration in thought process Goal: STG-Patient is able to follow short directions Outcome: Progressing Pt follows directions from staff regarding medication administration.

## 2016-02-10 NOTE — [facility] Group Notes (Signed)
[facility] LCSW Group Therapy  02/10/2016 8:37 AM  Type of Therapy:  Group Therapy  Participation Level:  Did Not Attend  Summary of Progress/Problems: Patient was called to group but did not attend.  Lulu RidingIngle, Countess Biebel T, MSW, LCSW 02/10/2016, 8:37 AM

## 2016-02-10 NOTE — Progress Notes (Signed)
D: Pt is pleasant and cooperative. He stays in his room most of the evening. Denies SI/HI/AVH at this time. Denies pain. Pt rates depression 2/10 and anxiety 4/10. Pt c/o difficulty getting to sleep this evening and requests PRN medication. A: Emotional support and encouragement provided. Medications administered with education. Pt educated on ways to improve sleep. q15 minute safety checks. R: Pt remains free from harm. Will continue to monitor.

## 2016-02-11 MED ORDER — ZIPRASIDONE HCL 80 MG PO CAPS: 80.0000 mg | ORAL_CAPSULE | Freq: Two times a day (BID) | ORAL | Status: DC

## 2016-02-11 MED ORDER — HYDROXYZINE HCL 50 MG PO TABS: 50.0000 mg | ORAL_TABLET | Freq: Three times a day (TID) | ORAL | Status: AC | PRN

## 2016-02-11 MED ORDER — HYDROCHLOROTHIAZIDE 50 MG PO TABS: 50.0000 mg | ORAL_TABLET | Freq: Every day | ORAL | Status: DC

## 2016-02-11 MED ORDER — HYDRALAZINE HCL 100 MG PO TABS: 100.0000 mg | ORAL_TABLET | Freq: Three times a day (TID) | ORAL | Status: DC

## 2016-02-11 MED ORDER — METOPROLOL TARTRATE 100 MG PO TABS: 100.0000 mg | ORAL_TABLET | Freq: Two times a day (BID) | ORAL | Status: DC

## 2016-02-11 MED ORDER — ASPIRIN 81 MG PO TBEC: 81.0000 mg | DELAYED_RELEASE_TABLET | Freq: Every day | ORAL | Status: DC

## 2016-02-11 MED ORDER — ISOSORBIDE MONONITRATE ER 120 MG PO TB24: 120.0000 mg | ORAL_TABLET | Freq: Every day | ORAL | Status: DC

## 2016-02-11 MED ORDER — LISINOPRIL 40 MG PO TABS: 40.0000 mg | ORAL_TABLET | Freq: Every day | ORAL | Status: DC

## 2016-02-11 MED ORDER — TRAZODONE HCL 150 MG PO TABS: 150.0000 mg | ORAL_TABLET | Freq: Every day | ORAL | Status: DC

## 2016-02-11 NOTE — Plan of Care (Signed)
Problem: Diagnosis: Increased Risk For Suicide Attempt Goal: LTG-Patient Will Report Improved Mood and Deny Suicidal LTG (by discharge) Patient will report improved mood and deny suicidal ideation.  Outcome: Progressing Pt denies SI at this time. He rates depression 0/10. Affect is bright. Goal: STG-Patient Will Attend All Groups On The Unit Outcome: Progressing Pt reports attendance at groups today.

## 2016-02-11 NOTE — [facility] Group Notes (Signed)
[facility] Group Notes:  (Nursing/MHT/Case Management/Adjunct)  Date:  02/11/2016  Time:  10:04 PM  Type of Therapy:  Group Therapy  Participation Level:  Active  Participation Quality:  Appropriate  Affect:  Appropriate  Cognitive:  Appropriate  Insight:  Appropriate  Engagement in Group:  Engaged  Modes of Intervention:  Discussion  Summary of Progress/Problems:  Burt EkJanice Marie Temple Sporer 02/11/2016, 10:04 PM

## 2016-02-11 NOTE — Progress Notes (Signed)
[facility]  02/11/2016 1:13 PM Timothy Jackson  MRN:  [id number]  Subjective: Mr. Timothy Jackson has a good day again. He denies any symptoms of depression, anxiety, or psychosis. He is not suicidal or homicidal. He is no longer preoccupied with delusional content and is able to participate in an interview and discharge planning. There are no somatic complaints. He accepts medications and tolerating them well. Discharge tomorrow.  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 HTN (hypertension) [I10] 01/29/2016  . 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 Cannabis use disorder, moderate, dependence (HCC) [F12.20] 01/28/2016  . Alcohol use disorder, moderate, dependence (HCC) [F10.20] 01/28/2016  . Suicidal ideation [R45.851] 01/28/2016  . Tobacco use disorder [F17.200] 01/28/2016  . Generalized anxiety disorder [F41.1] 05/18/2013   Total Time spent with patient: 20 minutes  Past Psychiatric History: Depression and psychosis.  Past Medical History:  Past Medical History  Diagnosis Date  . Hypertension   . Deaf     Right ear  . History of cardiac cath 02/24/2010     LVH, EF of 50-55%, LAD has 10-15% proximal stenosis, by Dr. Sharyn LullHarwani  . Anxiety, generalized   . Erectile dysfunction   . Delusional disorder (HCC)   . 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 Cardiac catheterization  01/2010    "essentially negative"  . Fracture surgery Left     ORIF left arm as a child   Family History:  Family History  Problem Relation Age of Onset  . Hypertension Father   . Coronary artery disease Maternal Grandmother   . Hypertension Maternal Grandmother   . Diabetes  Maternal Grandmother   . Coronary artery disease Maternal Uncle   . Hypertension Maternal Uncle   . Diabetes Maternal Aunt   . Leukemia Maternal Uncle    Family Psychiatric  History: None reported. Social History:  History  Alcohol Use  . 0.0 oz/week    Comment: daily     History  Drug Use  . Yes  . Special: Marijuana    Comment: Occassional marijuana use    Social History   Social History  . Marital Status: Single    Spouse Name: N/A  . Number of Children: N/A  . Years of Education: N/A   Occupational History  . desk job     Scientist, [employer]   Social History Main Topics  . Smoking status: Current Some Day Smoker    Types: Cigarettes  . Smokeless tobacco: None  . Alcohol Use: 0.0 oz/week     Comment: daily  . Drug Use: Yes    Special: Marijuana     Comment: Occassional marijuana use  . Sexual Activity: Not Currently   Other Topics Concern  . None   Social History Narrative   Additional Social History:                         Sleep: Fair  Appetite:  Fair  Current Medications: Current Facility-Administered Medications  Medication Dose Route Frequency Provider Last Rate Last Dose  . acetaminophen (TYLENOL) tablet 650 mg  650 mg Oral Q6H PRN Audery AmelJohn T Clapacs, MD      . alum & mag hydroxide-simeth (MAALOX/MYLANTA) 200-200-20 MG/5ML  suspension 30 mL  30 mL Oral Q4H PRN Audery Amel, MD      . aspirin EC tablet 81 mg  81 mg Oral Q breakfast Jasdeep Dejarnett B Marshawn Normoyle, MD   81 mg at 02/11/16 0836  . cloNIDine (CATAPRES) tablet 0.1 mg  0.1 mg Oral Q6H PRN Auburn Bilberry, MD      . hydrALAZINE (APRESOLINE) tablet 100 mg  100 mg Oral Q8H Auburn Bilberry, MD   100 mg at 02/11/16 1610  . hydrochlorothiazide (HYDRODIURIL) tablet 50 mg  50 mg Oral Q breakfast Camari Quintanilla B Vittoria Noreen, MD   50 mg at 02/11/16 0836  . hydrOXYzine (ATARAX/VISTARIL) tablet 50 mg  50 mg Oral TID PRN Shari Prows, MD   50 mg at 02/10/16 0010  . isosorbide mononitrate (IMDUR) 24 hr  tablet 120 mg  120 mg Oral Q breakfast Auburn Bilberry, MD   120 mg at 02/11/16 0835  . lisinopril (PRINIVIL,ZESTRIL) tablet 40 mg  40 mg Oral Q breakfast Shari Prows, MD   40 mg at 02/11/16 0835  . LORazepam (ATIVAN) tablet 2 mg  2 mg Oral Q4H PRN Audery Amel, MD   2 mg at 02/08/16 2118  . magnesium hydroxide (MILK OF MAGNESIA) suspension 30 mL  30 mL Oral Daily PRN Audery Amel, MD   30 mL at 01/31/16 2153  . metoprolol tartrate (LOPRESSOR) tablet 100 mg  100 mg Oral BID AC & HS Jaimen Melone B Kaylena Pacifico, MD   100 mg at 02/11/16 0836  . OLANZapine zydis (ZYPREXA) disintegrating tablet 10 mg  10 mg Oral TID PRN Shari Prows, MD      . traZODone (DESYREL) tablet 150 mg  150 mg Oral QHS Shari Prows, MD   150 mg at 02/10/16 2144  . ziprasidone (GEODON) capsule 80 mg  80 mg Oral BID AC Audery Amel, MD   80 mg at 02/10/16 1627    Lab Results: No results found for this or any previous visit (from the past 48 hour(s)).  Blood Alcohol level:  Lab Results  Component Value Date   [facility] <5 01/28/2016   ETH <11 06/01/2013    Physical Findings: AIMS: Facial and Oral Movements Muscles of Facial Expression: None, normal Lips and Perioral Area: None, normal Jaw: None, normal Tongue: None, normal,Extremity Movements Upper (arms, wrists, hands, fingers): None, normal Lower (legs, knees, ankles, toes): None, normal, Trunk Movements Neck, shoulders, hips: None, normal, Overall Severity Severity of abnormal movements (highest score from questions above): None, normal Incapacitation due to abnormal movements: None, normal Patient's awareness of abnormal movements (rate only patient's report): No Awareness, Dental Status Current problems with teeth and/or dentures?: No Does patient usually wear dentures?: No  CIWA:    COWS:     Musculoskeletal: Strength & Muscle Tone: within normal limits Gait & Station: normal Patient leans: N/A  Psychiatric Specialty Exam: Review of  Systems  Psychiatric/Behavioral: Positive for hallucinations.  All other systems reviewed and are negative.   Blood pressure 138/94, pulse 62, temperature 97.9 F (36.6 C), temperature source Oral, resp. rate 20, height 5\' 9"  (1.753 m), weight 180.078 kg (397 lb), SpO2 100 %.Body mass index is 58.6 kg/(m^2).  General Appearance: Casual  Eye Contact::  Good  Speech:  Clear and Coherent  Volume:  Normal  Mood:  Euthymic  Affect:  Appropriate  Thought Process:  Goal Directed  Orientation:  Full (Time, Place, and Person)  Thought Content:  WDL  Suicidal Thoughts:  No  Homicidal Thoughts:  No  Memory:  Immediate;   Fair Recent;   Fair Remote;   Fair  Judgement:  Impaired  Insight:  Shallow  Psychomotor Activity:  Normal  Concentration:  Fair  Recall:  [employer] of Knowledge:Fair  Language: Fair  Akathisia:  No  Handed:  Right  AIMS (if indicated):     Assets:  Communication Skills Desire for Improvement Financial Resources/Insurance Housing Physical Health Resilience Social Support  ADL's:  Intact  Cognition: WNL  Sleep:  Number of Hours: 6.5   Treatment Plan Summary: Daily contact with patient to assess and evaluate symptoms and progress in treatment and Medication management   Timothy Jackson is a 45 year old male with a history of delusional disorder admitted for worsening of depression, anxiety and aborted suicide attempt.  1. Suicidal ideation. The patient is able to contract for safety in the hospital.  2. Psychosis. He was started on Geodon for psychosis and Prozac for depression.    3. History of AVM. He was diagnosed years ago, prior to onset of psychosis. He has not been compliant with his follow-up appointments. I spoke briefly with Dr. Thad Ranger, neurology, who would consider MRI when patient lass anxious/psychotic. Th3e patient refuses MRI due to anxiety.  4. Diagnostic clarification. We ordered MMPI and psychological consult. The patient has not been able to  complete testing most likely due to paranoia.   5. Anxiety. We offer hydroxyzine for anxiety.  6. Hypertension. Medicine involvement is greatly appreciated. He is on multiple antihypertensives as guided by our internists. Blood pressure is an acceptable range today.  7. Metabolic syndrome screening. The patient is obese. His triglycerides are slightly elevated, TSH is normal. Hemoglobin A1c 6.3, prolactin 21.1.  8. Alcohol abuse. The patient has been drinking more lately, 2-3 beers a day. There were no symptoms of alcohol withdrawal.   9. Substance abuse treatment. The patient is a daily marijuana smoker. He minimizes his problems and declines treatment.   10. Smoking. Nicotine patch is available.  11. Insomnia. We started trazodone but the patient slept 4 hours only. He has been sleeping during the day yesterday. Will increase Trazodone.  12. Daytime somnolence. Possibly from sleep apnea.  13. Disposition. He will be discharged with his family. He will follow up with [facility] in [place].    Kristine Linea, MD 02/11/2016, 1:13 PM

## 2016-02-11 NOTE — [facility] Group Notes (Signed)
[facility] LCSW Group Therapy  02/11/2016 3:02 PM  Type of Therapy:  Group Therapy  Participation Level:  Active  Participation Quality:  Appropriate and Attentive  Affect:  Appropriate  Cognitive:  Alert, Appropriate and Oriented  Insight:  Improving  Engagement in Therapy:  Engaged  Modes of Intervention:  Discussion, Socialization and Support  Summary of Progress/Problems: Patient attended and participated in group discussion introducing himself and sharing during an introductory exercise that his [place] vacation would [place] to "[place] to see it all...the good, the bad, and the ugly". Patient was sharing and supportive during group and gave supportive feedback during group discussion and shared his struggle with depression but feels much better and feels that he is ready for discharge.    Timothy Jackson, Timothy Jackson, MSW, LCSW 02/11/2016, 3:02 PM

## 2016-02-11 NOTE — [facility] Suicide Risk Assessment (Addendum)
[facility] Discharge Suicide Risk Assessment   Principal Problem: Severe recurrent major depressive disorder with psychotic features [facility](HCC) Discharge Diagnoses:  Patient Active Problem List   Diagnosis Date Noted  . HTN (hypertension) [I10] 01/29/2016  . 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 Severe recurrent major depressive disorder with psychotic features (HCC) [F33.3] 01/28/2016  . Cannabis use disorder, moderate, dependence (HCC) [F12.20] 01/28/2016  . Alcohol use disorder, moderate, dependence (HCC) [F10.20] 01/28/2016  . Suicidal ideation [R45.851] 01/28/2016  . Tobacco use disorder [F17.200] 01/28/2016  . Generalized anxiety disorder [F41.1] 05/18/2013    Total Time spent with patient: 30 minutes  Musculoskeletal: Strength & Muscle Tone: within normal limits Gait & Station: normal Patient leans: N/A  Psychiatric Specialty Exam: Review of Systems  All other systems reviewed and are negative.   Blood pressure 130/83, pulse 64, temperature 98.2 F (36.8 C), temperature source Oral, resp. rate 18, height 5\' 9"  (1.753 m), weight 180.078 kg (397 lb), SpO2 100 %.Body mass index is 58.6 kg/(m^2).  General Appearance: Casual  Eye Contact::  Good  Speech:  Clear and Coherent409  Volume:  Normal  Mood:  Anxious  Affect:  Appropriate  Thought Process:  Goal Directed  Orientation:  Full (Time, Place, and Person)  Thought Content:  WDL  Suicidal Thoughts:  No  Homicidal Thoughts:  No  Memory:  Immediate;   Fair Recent;   Fair Remote;   Fair  Judgement:  Impaired  Insight:  Shallow  Psychomotor Activity:  Normal  Concentration:  Fair  Recall:  [employer]  Fund of Knowledge:Fair  Language: Fair  Akathisia:  No  Handed:  Right  AIMS (if indicated):     Assets:  Communication Skills Desire for Improvement Financial Resources/Insurance Housing Physical Health Resilience Social Support Talents/Skills  Sleep:  Number of Hours: 6.5  Cognition: WNL  ADL's:   Intact   Mental Status Per Nursing Assessment::   On Admission:  Suicidal ideation indicated by patient  Demographic Factors:  Male and Low socioeconomic status  Loss Factors: NA  Historical Factors: Impulsivity  Risk Reduction Factors:   Responsible for children under 47 years of age, Sense of responsibility to family, Living with another person, especially a relative and Positive social support  Continued Clinical Symptoms:  Depression:   Comorbid alcohol abuse/dependence Impulsivity Alcohol/Substance Abuse/Dependencies  Cognitive Features That Contribute To Risk:  None    Suicide Risk:  Minimal: No identifiable suicidal ideation.  Patients presenting with no risk factors but with morbid ruminations; may be classified as minimal risk based on the severity of the depressive symptoms  Follow-up Information    Follow up with [facility].   Why:  Please arrive to the walk-in clinic between the hours of 8am-4pm Monday thru Friday for an assessment and for scheduling of your appointments for medication managment and therapy   Contact information:   [address] [place], [place] [address] Phone: [phone] Fax: [phone]      Plan Of Care/Follow-up recommendations:  Activity:  as tolerated Diet:  low sodium heart healthy Other:  keep follow up appointment  Kristine LineaJolanta Luisangel Wainright, MD 02/11/2016, 8:20 PM

## 2016-02-11 NOTE — Plan of Care (Signed)
Problem: Alteration in mood Goal: LTG-Patient reports reduction in suicidal thoughts (Patient reports reduction in suicidal thoughts and is able to verbalize a safety plan for whenever patient is feeling suicidal)  Outcome: Progressing Denies SI and contracts for safety

## 2016-02-11 NOTE — Progress Notes (Signed)
D: Pt is seen in the milieu interacting appropriately with staff and peers this evening. He reports that he attended groups and ate all of his meals today. Pt states "I've been here since the 3rd, I can't believe I never went before, it was great." Pt discusses with writer that he became tearful during group today and is unsure why. He rates depression and anxiety 0/10. Denies SI/HI/AVH at this time. Denies pain. Pt requests hygiene supplies and washes his clothes this evening. A: Emotional support and encouragement provided. Medications administered with education. q15 minute safety checks maintained.  R: Pt remains free from harm. Will continue to monitor.

## 2016-02-11 NOTE — Progress Notes (Signed)
Recreation Therapy Notes  Date: 05.17.17 Time: 9:30 am Location: Craft Room  Group Topic: Coping Skills  Goal Area(s) Addresses:  Patient will participate in healthy coping skill. Patient will verbalize what they are focused on while coloring.  Behavioral Response: Attentive  Intervention: Coloring  Activity: Patients were given coloring sheets to color and were instructed to think about what they were focused on while they were coloring.  Education: LRT educated patients on healthy coping skills.  Education Outcome: In group clarification offered   Clinical Observations/Feedback: Patient colored coloring sheet. Patient did not contribute to group discussion.  Jacquelynn CreeGreene,Faron Whitelock M, LRT/CTRS 02/11/2016 10:23 AM

## 2016-02-11 NOTE — Progress Notes (Signed)
Patient with a bright affect, interacted appropriately with peers, attended and participated in groups. Denied SI/HI/AH/VH and contracted for safety. Remains cooperative with routine and med compliant.

## 2016-02-11 NOTE — [facility] Group Notes (Signed)
[facility] Group Notes:  (Nursing/MHT/Case Management/Adjunct)  Date:  02/11/2016  Time:  4:20 PM  Type of Therapy:  Group Therapy  Participation Level:  Active  Participation Quality:  Appropriate  Affect:  Appropriate  Cognitive:  Appropriate  Insight:  Good  Engagement in Group:  Engaged  Modes of Intervention:  Support  Summary of Progress/Problems:  Timothy Jackson 02/11/2016, 4:20 PM
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
## 2016-02-12 DIAGNOSIS — F22 Delusional disorders: Secondary | ICD-10-CM | POA: Insufficient documentation

## 2016-02-12 NOTE — Plan of Care (Signed)
Problem: Alteration in mood Goal: STG-Patient is able to discuss feelings and issues (Patient is able to discuss feelings and issues leading to depression)  Outcome: Progressing Pt very open about talking about feelings and issues dealing with depression.

## 2016-02-12 NOTE — Progress Notes (Signed)
D:Patient aware of discharge this shift . Patient returning home . Patient received all belonging locked up . Patient denies  Suicidal  And homicidal ideations  .  A: Writer instructed on discharge criteria  Patient received AVS Form , Transitional Records ,Suicidal Risk Assesment  And  prescriptions   . Aware  Of follow up appointment . R: Patient left unit with no questions  Or concerns  Public transportation.

## 2016-02-12 NOTE — Progress Notes (Signed)
Recreation Therapy Notes  Date: 05.18.17 Time: 1:00 pm Location: Craft Room  Group Topic: Leisure Education  Goal Area(s) Addresses:  Patient will identify activities for each letter of the alphabet. Patient will verbalize ability to integrate positive leisure into life post d/c. Patient will verbalize ability to use leisure as a Associate Professorcoping skill.  Behavioral Response: Attentive, Left early  Intervention: Leisure Alphabet  Activity: Patients were given a Leisure Information systems managerAlphabet worksheet and instructed to pick a leisure activity for each letter of the alphabet.  Education: LRT educated patients on what they need to participate in leisure.  Education Outcome: Patient left before LRT educated group.  Clinical Observations/Feedback: Patient completed activity by writing healthy leisure activities. Patient left group at approximately 1:22 pm with nurse. Patient did not return to group.  Jacquelynn CreeGreene,Jordanne Elsbury M, LRT/CTRS 02/12/2016 3:07 PM

## 2016-02-12 NOTE — Progress Notes (Signed)
D: Observed pt in day room interacting with peers. Patient alert and oriented x4. Patient denies SI/HI/AVH. Pt affect is anxious and silly. Pt talked about discharge plans, and has a positive out look towards discharge. Pt stated his mood is good. Pt jokes and laughs appropriately with staff and peers. Pt rated depression 0/10 and anxiety 7/10. Pt stated that over his length of stay, he has learned "tools to overcome anxiety and depression."  A: Offered active listening and support. Provided therapeutic communication. Administered scheduled medications. Gave vistaril prn for anxiety.  R: Pt pleasant and cooperative. Pt medication compliant. Will continue Q15 min. checks. Safety maintained.

## 2016-02-12 NOTE — Progress Notes (Addendum)
  [facility] Discharge [facility] :  Will you be returning to the same living situation after discharge:  No. [place] [facility] [place] [facility]itt[facility] into an [facility] At discharge, do you have transportation home?: Yes,  pt will be provided with bus passes Do you have the ability to pay for your medications: Yes,  pt will be provided with prescriptions at discharge  Release of information consent forms completed and in the chart;  Patient's signature needed at discharge.  Patient to Follow up at: Follow-up Information    Go to [facility].   Why:  Please arrive to the walk-in clinic between the hours of 8am-4pm Monday thru Friday for an assessment and for scheduling of your appointments for medication managment, substance abuse treatment and therapy   Contact information:   [address] [place], [place] [address] Phone: [phone](336) [phone] Fax: [phone]      Call [facility]   Why:  Please call upon discharge to schedule your assessment for medication managment, substance abuse counseling and therapy.  Deposit for assessment is $150 (to be refunded minus first co-pay) will be due when scheduling and first appt will be in late June   Contact information:   [address] [place], [place] [address] Ph:([phone] [phone]         Next level of care provider has access to [facility] Link:no  Safety Planning and Suicide Prevention discussed: NO, pt refused SPE  Have you used any form of tobacco in the last 30 days? (Cigarettes, Smokeless Tobacco, Cigars, and/or Pipes): No  Has patient been referred to the Quitline?: N/A patient is not a smoker  Patient has been referred for addiction treatment: Yes  Dorothe PeaJonathan F Howell Groesbeck 02/12/2016, 12:59 PM
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
## 2016-02-14 ENCOUNTER — Emergency Department (HOSPITAL_COMMUNITY)
Admission: EM | Admit: 2016-02-14 | Discharge: 2016-02-16 | Disposition: A | Discharge status: Home / Self Care | Payer: Self-pay | Attending: Emergency Medicine | Admitting: Emergency Medicine

## 2016-02-14 ENCOUNTER — Encounter (HOSPITAL_COMMUNITY): Payer: Self-pay | Admitting: *Deleted

## 2016-02-14 DIAGNOSIS — F17200 Nicotine dependence, unspecified, uncomplicated: Secondary | ICD-10-CM | POA: Insufficient documentation

## 2016-02-14 DIAGNOSIS — F22 Delusional disorders: Secondary | ICD-10-CM | POA: Insufficient documentation

## 2016-02-14 DIAGNOSIS — I10 Essential (primary) hypertension: Secondary | ICD-10-CM | POA: Insufficient documentation

## 2016-02-14 DIAGNOSIS — F600 Paranoid personality disorder: Secondary | ICD-10-CM | POA: Insufficient documentation

## 2016-02-14 HISTORY — DX: Arteriovenous malformation, site unspecified: Q27.30

## 2016-02-14 HISTORY — DX: Delusional disorders: F22

## 2016-02-14 LAB — COMPREHENSIVE METABOLIC PANEL
ALBUMIN: 4.4 g/dL (ref 3.5–5.0)
ALK PHOS: 74 U/L (ref 38–126)
ALT: 33 U/L (ref 17–63)
AST: 24 U/L (ref 15–41)
Anion gap: 7 (ref 5–15)
BUN: 27 mg/dL — AB (ref 6–20)
CALCIUM: 9.2 mg/dL (ref 8.9–10.3)
CO2: 29 mmol/L (ref 22–32)
CREATININE: 1.57 mg/dL — AB (ref 0.61–1.24)
Chloride: 102 mmol/L (ref 101–111)
GFR calc Af Amer: 59 mL/min — ABNORMAL LOW (ref >60)
GFR calc non Af Amer: 51 mL/min — ABNORMAL LOW (ref >60)
GLUCOSE: 126 mg/dL — AB (ref 65–99)
Potassium: 3.6 mmol/L (ref 3.5–5.1)
SODIUM: 138 mmol/L (ref 135–145)
Total Bilirubin: 0.3 mg/dL (ref 0.3–1.2)
Total Protein: 7.5 g/dL (ref 6.5–8.1)

## 2016-02-14 LAB — CBC
HEMATOCRIT: 42 % (ref 39.0–52.0)
HEMOGLOBIN: 13.6 g/dL (ref 13.0–17.0)
MCH: 26.5 pg (ref 26.0–34.0)
MCHC: 32.4 g/dL (ref 30.0–36.0)
MCV: 81.9 fL (ref 78.0–100.0)
Platelets: 298 10*3/uL (ref 150–400)
RBC: 5.13 MIL/uL (ref 4.22–5.81)
RDW: 13.7 % (ref 11.5–15.5)
WBC: 5.8 10*3/uL (ref 4.0–10.5)

## 2016-02-14 LAB — SALICYLATE LEVEL: Salicylate Lvl: <4 mg/dL (ref 2.8–30.0)

## 2016-02-14 LAB — ETHANOL: Alcohol, Ethyl (B): <5 mg/dL (ref <5)

## 2016-02-14 LAB — ACETAMINOPHEN LEVEL

## 2016-02-14 MED ORDER — DIPHENHYDRAMINE HCL 25 MG PO CAPS
100.0000 mg | ORAL_CAPSULE | Freq: Once | ORAL | Status: AC
  Administered 2016-02-14: 100 mg via ORAL
  Filled 2016-02-14: qty 4

## 2016-02-14 MED ORDER — ACETAMINOPHEN 325 MG PO TABS
650.0000 mg | ORAL_TABLET | ORAL | Status: DC | PRN
  Administered 2016-02-14 – 2016-02-15 (×2): 650 mg via ORAL
  Filled 2016-02-14 (×2): qty 2

## 2016-02-14 MED ORDER — ALUM & MAG HYDROXIDE-SIMETH 200-200-20 MG/5ML PO SUSP: 30.0000 mL | ORAL | Status: DC | PRN

## 2016-02-14 MED ORDER — LORAZEPAM 1 MG PO TABS
1.0000 mg | ORAL_TABLET | Freq: Three times a day (TID) | ORAL | Status: DC | PRN
  Administered 2016-02-14: 1 mg via ORAL
  Filled 2016-02-14: qty 1

## 2016-02-14 MED ORDER — ONDANSETRON HCL 4 MG PO TABS: 4.0000 mg | ORAL_TABLET | Freq: Three times a day (TID) | ORAL | Status: DC | PRN

## 2016-02-14 MED ORDER — ZIPRASIDONE HCL 20 MG PO CAPS
20.0000 mg | ORAL_CAPSULE | Freq: Once | ORAL | Status: AC
  Administered 2016-02-14: 20 mg via ORAL
  Filled 2016-02-14: qty 1

## 2016-02-14 NOTE — BH Assessment (Signed)
[facility] Assessment Progress Note  Case was staffed with Welford Rochenuoha NP who recommended patient be observed overnight and re-evaluated in the a.m.

## 2016-02-14 NOTE — ED Notes (Signed)
MD at bedside. Psychiatry at bedside.

## 2016-02-14 NOTE — ED Provider Notes (Signed)
CSN: [id number]     Arrival date & time 02/14/16  1251 History   First MD Initiated Contact with Patient 02/14/16 1338     Chief Complaint  Patient presents with  . Medical Clearance     (Consider location/radiation/quality/duration/timing/severity/associated sxs/prior Treatment) HPI 45 year old male who presents for psychiatric evaluation. History of hypertension AVM. Patient states that he has a history of paranoia. Difficult to obtain history from given very tangential speech requiring significant amount of effort to redirect him. States that he has been feeling extremely paranoid recently. States that he was feeling suicidal recently and was admitted to psychiatric treatment in [place]. States that when he released he continues to feel unsafe, as if people are after him. States that he came to the ED for evaluation of this. Denies any other medical illnesses or complaints at this time.   Past Medical History  Diagnosis Date  . Paranoid (HCC)   . AVM (arteriovenous malformation)   . Hypertension    History reviewed. No pertinent past surgical history. History reviewed. No pertinent family history. Social History  Substance Use Topics  . Smoking status: Current Every Day Smoker  . Smokeless tobacco: None  . Alcohol Use: Yes    Review of Systems  Unable to perform ROS: Psychiatric disorder     Allergies  Review of patient's allergies indicates no known allergies.  Home Medications   Prior to Admission medications   Not on File   BP 156/99 mmHg  Pulse 85  Temp(Src) 97.9 F (36.6 C) (Oral)  Resp 18  SpO2 98% Physical Exam Physical Exam  Nursing note and vitals reviewed. Constitutional: Well developed, well nourished, non-toxic, and in no acute distress Head: Normocephalic and atraumatic.  Mouth/Throat: Oropharynx is clear and moist.  Neck: Normal range of motion. Neck supple.  Cardiovascular: Normal rate and regular rhythm.   Pulmonary/Chest: Effort normal  and breath sounds normal.  Abdominal: Soft. There is no tenderness. There is no rebound and no guarding.  Musculoskeletal: Normal range of motion.  Neurological: Alert, no facial droop, fluent speech, moves all extremities symmetrically Skin: Skin is warm and dry.  Psychiatric: Cooperative, but with rapid tangential speech  ED Course  Procedures (including critical care time) Labs Review Labs Reviewed  COMPREHENSIVE METABOLIC PANEL - Abnormal; Notable for the following:    Glucose, Bld 126 (*)    BUN 27 (*)    Creatinine, Ser 1.57 (*)    GFR calc non Af Amer 51 (*)    GFR calc Af Amer 59 (*)    All other components within normal limits  ACETAMINOPHEN LEVEL - Abnormal; Notable for the following:    Acetaminophen (Tylenol), Serum <10 (*)    All other components within normal limits  ETHANOL  SALICYLATE LEVEL  CBC  URINE RAPID DRUG SCREEN, HOSP PERFORMED  URINALYSIS, ROUTINE W REFLEX MICROSCOPIC (NOT AT [facility])    Imaging Review No results found. I have personally reviewed and evaluated these images and lab results as part of my medical decision-making.   EKG Interpretation None      MDM   Final diagnoses:  Paranoid behavior (HCC)  Paranoid type delusional disorder (HCC)   Presenting with paranoia and feeling unsafe. Very difficult to obtain history from given rapid and tangential speech, with inability to redirect. During my evaluation, one of the ED technicians accidentally walked into the room. After she left, patient began eyeing the door stating that that was not an accident, and that he was suspicious that she  was occluding against him. States that he recently also was in a movie, and that he was a mess. He keeps asking me to google this movie which is currently at red box. Exam is otherwise unremarkable. Is hypertensive, but states he was not given any of medications when he left a hospital recently.   Blood work overall unremarkable, and UDS is pending at this time. Is  felt to be medically cleared for TTS consult.   Lavera Guiseana Duo Khalifa Knecht, MD 02/14/16 2207

## 2016-02-14 NOTE — ED Notes (Signed)
Patient noted in room. No complaints, stable, in no acute distress. Q15 minute rounds and monitoring via Security Cameras to continue.  
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
## 2016-02-14 NOTE — ED Notes (Signed)
Update to receiving RN and transfer to [facility] 34.

## 2016-02-14 NOTE — ED Notes (Signed)
Pt. Transferred to [facility] from ED to room 34. [facility] from [facility]. Pt. Oriented to unit including Q15 minute rounds as well as the security cameras for their protection. Patient is alert and oriented, warm and dry in no acute distress. Patient denies SI, HI, and AVH. Pt. Encouraged to let me know if needs arise.

## 2016-02-14 NOTE — ED Notes (Addendum)
Pt brought in by [employer], [facility]  [facility] before [facility] there, Now wants to get help so he can spend time with his son."Burgess EstelleYesterday was a crazy day" "I might have hurt someone yesterday" "I feel that I am being watched all the time" Rambling conversation, speaking of Paranoia, he thinks people are coming here to harm him. Pt feels people will come to get him when they find out he is here.

## 2016-02-14 NOTE — ED Notes (Signed)
C/o anxiety.  PRN medication requested and received.

## 2016-02-14 NOTE — ED Notes (Signed)
Patient noted in room. C/O paranoia, tearful at times, stable, in no acute distress. Q15 minute rounds and monitoring via [employer] Cam[employer] to continue.

## 2016-02-15 DIAGNOSIS — F22 Delusional disorders: Secondary | ICD-10-CM | POA: Insufficient documentation

## 2016-02-15 DIAGNOSIS — F200 Paranoid schizophrenia: Secondary | ICD-10-CM

## 2016-02-15 MED ORDER — HYDRALAZINE HCL 50 MG PO TABS
100.0000 mg | ORAL_TABLET | Freq: Three times a day (TID) | ORAL | Status: DC
  Administered 2016-02-15 – 2016-02-16 (×4): 100 mg via ORAL
  Filled 2016-02-15 (×6): qty 2

## 2016-02-15 MED ORDER — LISINOPRIL 40 MG PO TABS
40.0000 mg | ORAL_TABLET | Freq: Every day | ORAL | Status: DC
  Administered 2016-02-15 – 2016-02-16 (×2): 40 mg via ORAL
  Filled 2016-02-15 (×2): qty 1

## 2016-02-15 MED ORDER — ISOSORBIDE MONONITRATE ER 60 MG PO TB24
120.0000 mg | ORAL_TABLET | Freq: Every day | ORAL | Status: DC
  Administered 2016-02-15 – 2016-02-16 (×2): 120 mg via ORAL
  Filled 2016-02-15 (×2): qty 2

## 2016-02-15 MED ORDER — ASPIRIN EC 81 MG PO TBEC
81.0000 mg | DELAYED_RELEASE_TABLET | Freq: Every day | ORAL | Status: DC
  Administered 2016-02-15 – 2016-02-16 (×2): 81 mg via ORAL
  Filled 2016-02-15 (×2): qty 1

## 2016-02-15 MED ORDER — HYDROXYZINE HCL 25 MG PO TABS
50.0000 mg | ORAL_TABLET | Freq: Three times a day (TID) | ORAL | Status: DC | PRN
  Administered 2016-02-15: 50 mg via ORAL
  Filled 2016-02-15: qty 2

## 2016-02-15 MED ORDER — TRAZODONE HCL 50 MG PO TABS
150.0000 mg | ORAL_TABLET | Freq: Every day | ORAL | Status: DC
  Administered 2016-02-15: 150 mg via ORAL
  Filled 2016-02-15: qty 1

## 2016-02-15 MED ORDER — METOPROLOL TARTRATE 25 MG PO TABS
100.0000 mg | ORAL_TABLET | Freq: Two times a day (BID) | ORAL | Status: DC
  Administered 2016-02-15 – 2016-02-16 (×2): 100 mg via ORAL
  Filled 2016-02-15 (×2): qty 4

## 2016-02-15 MED ORDER — ZIPRASIDONE HCL 20 MG PO CAPS
80.0000 mg | ORAL_CAPSULE | Freq: Two times a day (BID) | ORAL | Status: DC
  Administered 2016-02-15 – 2016-02-16 (×2): 80 mg via ORAL
  Filled 2016-02-15 (×3): qty 4

## 2016-02-15 NOTE — ED Notes (Signed)
Patient noted sleeping in room. No complaints, stable, in no acute distress. Q15 minute rounds and monitoring via Security Cameras to continue.  

## 2016-02-15 NOTE — BH Assessment (Signed)
Per Julieanne CottonJosephine NP - patient meets criteria for inpt hosp.  [facility].    Writer faxed referral to the following hospitals: [facility]  [facility]  [facility]  [facility]

## 2016-02-15 NOTE — Progress Notes (Signed)
Pt awake in bed at present. Anxious, paranoid and suspicious. Pt denies SI, HI, AVH and pain. Pt has been tearful majority of this shift about recent events and demands to go to [place];  "[facility] said that [facility] and that's where I need to be, you people can't help me here". Emotional support, availability and encouragement provided to pt. Safety checks maintained at Q 15 minutes intervals without self harm gestures or outburst to note at this time.
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
## 2016-02-15 NOTE — Consult Note (Signed)
Addendum:  Plan of care:  Disregard previous medications in place for patient.  We  Have resumed his home  medications after Pharmacy reconciliation  Of home meds.  [facility]  Dahlia ByesJosephine Onuoha   PMHNP-BC  Reviewed the information documented and agree with the treatment plan.  Delsie Amador,JANARDHAHA R. 02/16/2016 9:55 AM

## 2016-02-15 NOTE — ED Notes (Addendum)
Patient seen sleeping at the time of shift change. Continue to sleep at this time. No distress noted. Every 15 minutes check for safety maintained. Will continue to monitor patient for safety and stability.

## 2016-02-16 ENCOUNTER — Encounter (HOSPITAL_COMMUNITY): Payer: Self-pay

## 2016-02-16 ENCOUNTER — Inpatient Hospital Stay (HOSPITAL_COMMUNITY)
Admission: AD | Admit: 2016-02-16 | Discharge: 2016-02-22 | DRG: 885 | Disposition: A | Discharge status: Other Acute Inpatient Hospital | Payer: Medicare Other | Source: Intra-hospital | Attending: Emergency Medicine | Admitting: Emergency Medicine

## 2016-02-16 ENCOUNTER — Emergency Department (HOSPITAL_COMMUNITY)
Admission: EM | Admit: 2016-02-16 | Discharge: 2016-02-16 | Disposition: A | Discharge status: Other Acute Inpatient Hospital | Payer: Self-pay | Attending: Emergency Medicine | Admitting: Emergency Medicine

## 2016-02-16 DIAGNOSIS — Z809 Family history of malignant neoplasm, unspecified: Secondary | ICD-10-CM | POA: Diagnosis not present

## 2016-02-16 DIAGNOSIS — Y9289 Other specified places as the place of occurrence of the external cause: Secondary | ICD-10-CM | POA: Insufficient documentation

## 2016-02-16 DIAGNOSIS — Z818 Family history of other mental and behavioral disorders: Secondary | ICD-10-CM | POA: Diagnosis not present

## 2016-02-16 DIAGNOSIS — R45851 Suicidal ideations: Secondary | ICD-10-CM | POA: Diagnosis present

## 2016-02-16 DIAGNOSIS — F333 Major depressive disorder, recurrent, severe with psychotic symptoms: Secondary | ICD-10-CM | POA: Diagnosis present

## 2016-02-16 DIAGNOSIS — X788XXA Intentional self-harm by other sharp object, initial encounter: Secondary | ICD-10-CM | POA: Insufficient documentation

## 2016-02-16 DIAGNOSIS — F17210 Nicotine dependence, cigarettes, uncomplicated: Secondary | ICD-10-CM | POA: Insufficient documentation

## 2016-02-16 DIAGNOSIS — Y939 Activity, unspecified: Secondary | ICD-10-CM | POA: Insufficient documentation

## 2016-02-16 DIAGNOSIS — R0602 Shortness of breath: Secondary | ICD-10-CM | POA: Diagnosis not present

## 2016-02-16 DIAGNOSIS — F1220 Cannabis dependence, uncomplicated: Secondary | ICD-10-CM | POA: Diagnosis present

## 2016-02-16 DIAGNOSIS — I509 Heart failure, unspecified: Secondary | ICD-10-CM

## 2016-02-16 DIAGNOSIS — I10 Essential (primary) hypertension: Secondary | ICD-10-CM | POA: Diagnosis present

## 2016-02-16 DIAGNOSIS — Y999 Unspecified external cause status: Secondary | ICD-10-CM | POA: Insufficient documentation

## 2016-02-16 DIAGNOSIS — F22 Delusional disorders: Secondary | ICD-10-CM | POA: Diagnosis not present

## 2016-02-16 DIAGNOSIS — S61512A Laceration without foreign body of left wrist, initial encounter: Secondary | ICD-10-CM | POA: Insufficient documentation

## 2016-02-16 DIAGNOSIS — G4710 Hypersomnia, unspecified: Secondary | ICD-10-CM | POA: Diagnosis present

## 2016-02-16 DIAGNOSIS — Z8774 Personal history of (corrected) congenital malformations of heart and circulatory system: Secondary | ICD-10-CM

## 2016-02-16 DIAGNOSIS — Z7982 Long term (current) use of aspirin: Secondary | ICD-10-CM | POA: Insufficient documentation

## 2016-02-16 DIAGNOSIS — F411 Generalized anxiety disorder: Secondary | ICD-10-CM | POA: Diagnosis not present

## 2016-02-16 DIAGNOSIS — F419 Anxiety disorder, unspecified: Secondary | ICD-10-CM | POA: Diagnosis present

## 2016-02-16 DIAGNOSIS — S61511A Laceration without foreign body of right wrist, initial encounter: Secondary | ICD-10-CM | POA: Insufficient documentation

## 2016-02-16 DIAGNOSIS — F323 Major depressive disorder, single episode, severe with psychotic features: Secondary | ICD-10-CM | POA: Diagnosis present

## 2016-02-16 DIAGNOSIS — Z23 Encounter for immunization: Secondary | ICD-10-CM | POA: Insufficient documentation

## 2016-02-16 DIAGNOSIS — R0600 Dyspnea, unspecified: Secondary | ICD-10-CM

## 2016-02-16 DIAGNOSIS — F200 Paranoid schizophrenia: Secondary | ICD-10-CM | POA: Insufficient documentation

## 2016-02-16 DIAGNOSIS — R609 Edema, unspecified: Secondary | ICD-10-CM

## 2016-02-16 DIAGNOSIS — F322 Major depressive disorder, single episode, severe without psychotic features: Secondary | ICD-10-CM | POA: Diagnosis present

## 2016-02-16 DIAGNOSIS — T1491XA Suicide attempt, initial encounter: Secondary | ICD-10-CM

## 2016-02-16 HISTORY — DX: Suicide attempt, initial encounter: T14.91XA

## 2016-02-16 MED ORDER — ZIPRASIDONE HCL 80 MG PO CAPS
80.0000 mg | ORAL_CAPSULE | Freq: Two times a day (BID) | ORAL | Status: DC
  Administered 2016-02-16 – 2016-02-17 (×2): 80 mg via ORAL
  Filled 2016-02-16 (×3): qty 1
  Filled 2016-02-16: qty 2
  Filled 2016-02-16 (×3): qty 1

## 2016-02-16 MED ORDER — SODIUM BICARBONATE 4 % IV SOLN
5.0000 mL | Freq: Once | INTRAVENOUS | Status: AC
  Administered 2016-02-16: 5 mL via SUBCUTANEOUS
  Filled 2016-02-16: qty 5

## 2016-02-16 MED ORDER — LISINOPRIL 20 MG PO TABS
40.0000 mg | ORAL_TABLET | Freq: Every day | ORAL | Status: DC
  Administered 2016-02-17 – 2016-02-22 (×6): 40 mg via ORAL
  Filled 2016-02-16 (×4): qty 1
  Filled 2016-02-16: qty 2
  Filled 2016-02-16: qty 1
  Filled 2016-02-16: qty 2
  Filled 2016-02-16: qty 1
  Filled 2016-02-16: qty 2

## 2016-02-16 MED ORDER — ALUM & MAG HYDROXIDE-SIMETH 200-200-20 MG/5ML PO SUSP: 30.0000 mL | ORAL | Status: DC | PRN

## 2016-02-16 MED ORDER — ZIPRASIDONE HCL 20 MG PO CAPS: 80.0000 mg | ORAL_CAPSULE | Freq: Two times a day (BID) | ORAL | Status: DC

## 2016-02-16 MED ORDER — ACETAMINOPHEN 325 MG PO TABS
650.0000 mg | ORAL_TABLET | Freq: Four times a day (QID) | ORAL | Status: DC | PRN
  Administered 2016-02-16 – 2016-02-17 (×2): 650 mg via ORAL
  Filled 2016-02-16 (×2): qty 2

## 2016-02-16 MED ORDER — TRAZODONE HCL 100 MG PO TABS: 150.0000 mg | ORAL_TABLET | Freq: Every day | ORAL | Status: DC

## 2016-02-16 MED ORDER — ISOSORBIDE MONONITRATE ER 60 MG PO TB24
120.0000 mg | ORAL_TABLET | Freq: Every day | ORAL | Status: DC
  Administered 2016-02-17 – 2016-02-22 (×6): 120 mg via ORAL
  Filled 2016-02-16 (×8): qty 2

## 2016-02-16 MED ORDER — TRAZODONE HCL 150 MG PO TABS
150.0000 mg | ORAL_TABLET | Freq: Every day | ORAL | Status: DC
  Administered 2016-02-16 – 2016-02-20 (×5): 150 mg via ORAL
  Filled 2016-02-16 (×8): qty 1

## 2016-02-16 MED ORDER — HYDRALAZINE HCL 50 MG PO TABS
100.0000 mg | ORAL_TABLET | Freq: Three times a day (TID) | ORAL | Status: DC
  Administered 2016-02-16 – 2016-02-22 (×18): 100 mg via ORAL
  Filled 2016-02-16 (×24): qty 2

## 2016-02-16 MED ORDER — TETANUS-DIPHTH-ACELL PERTUSSIS 5-2.5-18.5 LF-MCG/0.5 IM SUSP
0.5000 mL | Freq: Once | INTRAMUSCULAR | Status: AC
  Administered 2016-02-16: 0.5 mL via INTRAMUSCULAR
  Filled 2016-02-16: qty 0.5

## 2016-02-16 MED ORDER — LISINOPRIL 20 MG PO TABS
40.0000 mg | ORAL_TABLET | Freq: Every day | ORAL | Status: DC
  Filled 2016-02-16: qty 1

## 2016-02-16 MED ORDER — LIDOCAINE HCL (PF) 1 % IJ SOLN
5.0000 mL | Freq: Once | INTRAMUSCULAR | Status: AC
  Administered 2016-02-16: 5 mL
  Filled 2016-02-16: qty 30

## 2016-02-16 MED ORDER — METOPROLOL TARTRATE 25 MG PO TABS: 100.0000 mg | ORAL_TABLET | Freq: Two times a day (BID) | ORAL | Status: DC

## 2016-02-16 MED ORDER — MAGNESIUM HYDROXIDE 400 MG/5ML PO SUSP: 30.0000 mL | Freq: Every day | ORAL | Status: DC | PRN

## 2016-02-16 MED ORDER — HYDRALAZINE HCL 50 MG PO TABS
100.0000 mg | ORAL_TABLET | Freq: Three times a day (TID) | ORAL | Status: DC
  Filled 2016-02-16 (×3): qty 2

## 2016-02-16 MED ORDER — BACITRACIN ZINC 500 UNIT/GM EX OINT
TOPICAL_OINTMENT | CUTANEOUS | Status: AC
  Administered 2016-02-16: 1
  Filled 2016-02-16: qty 0.9

## 2016-02-16 MED ORDER — ISOSORBIDE MONONITRATE ER 60 MG PO TB24
120.0000 mg | ORAL_TABLET | Freq: Every day | ORAL | Status: DC
  Filled 2016-02-16: qty 2

## 2016-02-16 MED ORDER — ASPIRIN EC 81 MG PO TBEC
81.0000 mg | DELAYED_RELEASE_TABLET | Freq: Every day | ORAL | Status: DC
  Filled 2016-02-16: qty 1

## 2016-02-16 MED ORDER — ASPIRIN EC 81 MG PO TBEC
81.0000 mg | DELAYED_RELEASE_TABLET | Freq: Every day | ORAL | Status: DC
  Administered 2016-02-17 – 2016-02-22 (×6): 81 mg via ORAL
  Filled 2016-02-16 (×8): qty 1

## 2016-02-16 MED ORDER — METOPROLOL TARTRATE 25 MG PO TABS
100.0000 mg | ORAL_TABLET | Freq: Two times a day (BID) | ORAL | Status: DC
  Administered 2016-02-16 – 2016-02-22 (×12): 100 mg via ORAL
  Filled 2016-02-16: qty 4
  Filled 2016-02-16 (×16): qty 1

## 2016-02-16 MED ORDER — HYDRALAZINE HCL 25 MG PO TABS
ORAL_TABLET | ORAL | Status: AC
  Filled 2016-02-16: qty 4

## 2016-02-16 NOTE — ED Notes (Signed)
Patient denies pain and is resting comfortably.  

## 2016-02-16 NOTE — Progress Notes (Addendum)
Timothy Jackson is a 45 year old male being admitted voluntarily to [address] and going to the waiting room and slit his wrist.  He came in initially for paranoia and self harm thoughts with no plan.  He was discharged to outpatient for follow up  but was very upset for being discharged.  He stated that he went through his belongings in the waiting room and found a razor.  "I just prayed and cut myself."  He denies HI or A/V hallucinations.  He stated that he just wants to get on the list for [facility] again.  He was tearful during the admission and just kept saying "I don't feel safe in [name], [place] want to get [place] [name]."   He states that people are out to get him and following him around.  Attempted to get his weight but unable to obtain accurate weight as scale would not measure.  Admission paperwork completed and signed.  Belongings searched and secured in locker # 44 see belongings sheet in paper chart.  Skin assessment completed and noted left wrist self inflicted laceration requiring 9 sutures and 2 self inflicted cuts on right wrist.  Q 15 minute checks initiated for safety.  We will monitor the progress towards his goals.

## 2016-02-16 NOTE — [facility] Suicide Risk Assessment (Signed)
Suicide Risk Assessment  Discharge Assessment   [facility]   Principal Problem: Paranoia Timothy Jackson(HCC) Discharge Diagnoses:  Patient Active Problem List   Diagnosis Date Noted  . Paranoia (HCC) [F22] 02/16/2016    Priority: High  . Schizophrenia, paranoid type (HCC) [F20.0] 02/15/2016  . Paranoid behavior (HCC) [F22]   . Paranoid type delusional disorder (HCC) [F22]     Total Time spent with patient: 30 minutes  Musculoskeletal: Strength & Muscle Tone: within normal limits Gait & Station: normal Patient leans: N/A  Psychiatric Specialty Exam: Physical Exam  Constitutional: He is oriented to person, place, and time. He appears well-developed and well-nourished.  HENT:  Head: Normocephalic.  Neck: Normal range of motion.  Respiratory: Effort normal.  Musculoskeletal: Normal range of motion.  Neurological: He is oriented to person, place, and time.  Skin: Skin is warm and dry.    Review of Systems  Constitutional: Negative.   HENT: Negative.   Eyes: Negative.   Respiratory: Negative.   Cardiovascular: Negative.   Gastrointestinal: Negative.   Genitourinary: Negative.   Musculoskeletal: Negative.   Skin: Negative.   Neurological: Negative.   Endo/Heme/Allergies: Negative.   Psychiatric/Behavioral: Positive for substance abuse.    Blood pressure 123/88, pulse 64, temperature 97.9 F (36.6 C), temperature source Oral, resp. rate 20, SpO2 98 %.There is no height or weight on file to calculate BMI.  General Appearance: Casual  Eye Contact:  Good  Speech:  Normal Rate  Volume:  Normal  Mood:  Irritable  Affect:  Congruent  Thought Process:  Coherent  Orientation:  Full (Time, Place, and Person)  Thought Content:  paranoia, mild occasionally  Suicidal Thoughts:  No  Homicidal Thoughts:  No  Memory:  Immediate;   Good Recent;   Good Remote;   Good  Judgement:  Fair  Insight:  Fair  Psychomotor Activity:  Normal  Concentration:  Concentration:  Good and Attention Span: Good  Recall:  Good  Fund of Knowledge:  Good  Language:  Good  Akathisia:  No  Handed:  Right  AIMS (if indicated):     Assets:  Leisure Time Physical Health Resilience  ADL's:  Intact  Cognition:  WNL  Sleep:      Mental Status Per Nursing Assessment::   On Admission:   paranoia  Demographic Factors:  Male  Loss Factors: NA  Historical Factors: NA  Risk Reduction Factors:   Sense of responsibility to family and Positive coping skills or problem solving skills  Continued Clinical Symptoms:  Irritability  Cognitive Features That Contribute To Risk:  None    Suicide Risk:  Minimal: No identifiable suicidal ideation.  Patients presenting with no risk factors but with morbid ruminations; may be classified as minimal risk based on the severity of the depressive symptoms    Plan Of Care/Follow-up recommendations:  Activity:  as tolerated Diet:  heart healthy diet  Timothy Riechers, NP 02/16/2016, 11:46 AM

## 2016-02-16 NOTE — ED Notes (Signed)
[facility] arrived w/ IVC paperwork and to transport Pt.  [facility].

## 2016-02-16 NOTE — Tx Team (Signed)
Initial Interdisciplinary Treatment Plan   PATIENT STRESSORS: Financial difficulties Homeless   PATIENT STRENGTHS: Wellsite geologistCommunication skills General fund of knowledge   PROBLEM LIST: Problem List/Patient Goals Date to be addressed Date deferred Reason deferred Estimated date of resolution  Depression 02/16/16     Suicidal ideation/attempt 02/16/16     Paranoia 02/16/16     "I want to get to a place for psychiatric care, like [facility]" 02/16/16     "Be patient and kind" 02/16/16                              DISCHARGE CRITERIA:  Improved stabilization in mood, thinking, and/or behavior Verbal commitment to aftercare and medication compliance  PRELIMINARY DISCHARGE PLAN: Outpatient therapy Medication management]  PATIENT/FAMIILY INVOLVEMENT: This treatment plan has been presented to and reviewed with the patient, Timothy Jackson.  The patient and family have been given the opportunity to ask questions and make suggestions.  Norm ParcelHeather V Ridley Dileo 02/16/2016, 9:16 PM

## 2016-02-16 NOTE — ED Notes (Signed)
Pt questioning this writer if he is going to get to stay.  Informed Pt that it is not up to this Clinical research associatewriter.  Pt sts "if they won't keep me, there is only one step further that I can go.  I need help.  I'm going to get killed."

## 2016-02-16 NOTE — Progress Notes (Signed)
D: Pt was in his room upon initial approach.  He presents with anxious affect and mood. Pt is paranoid about having a roommate and requested to sleep in the quiet room.  When asked about his day, he reports it was "not good."  When asked why it was not good, pt states " a lot of crazy shit."  Pt reports he wants to go to "[facility]."  Pt states "my anxiety is the worst problem, the anxiety pills help with my paranoia, a blunt does too."  Pt states "4 days ago I was released from [place] but somebody put me under a different number."  When asked if pt has thoughts of harming himself or others, pt states "I don't know."  Pt verbally contracts for safety.  He denies having hallucinations, denies pain.  He attended evening group.  He interacts with peers and staff cautiously.  Pt has dry, intact dressings to both wrists.    A: Introduced self to pt.  Actively listened to pt and offered support and encouragement.  Medication administered per order after PA reviewed scheduled medications.  PRN medication administered for pain.  Pt allowed to sleep in quiet room tonight due to paranoia.    R: Pt is compliant with medications.  He verbally contracts for safety.  Pt reports he will inform staff of needs and concerns.  Will continue to monitor and assess.

## 2016-02-16 NOTE — ED Provider Notes (Addendum)
CSN: [id number]     Arrival date & time 02/16/16  1448 History   First MD Initiated Contact with Patient 02/16/16 1457     Chief Complaint  Patient presents with  . Suicide Attempt  . Extremity Laceration     HPI Patient presents suicidal after slitting his wrists in the waiting room. Per patient he was just in our psychiatric area here and the doctor said he needed admission but then they came and discharged him. He states that they talk to the doctor said that he never said that. Patient states he is not handling it well. He states he needs help. States he's been recluse for 3 years. States he went out there and slit his wrists with a razor blade off with the razor. He remains suicidal. He had not left the ER his prior visit.    Past Medical History  Diagnosis Date  . Paranoid (HCC)   . AVM (arteriovenous malformation)   . Hypertension    History reviewed. No pertinent past surgical history. History reviewed. No pertinent family history. Social History  Substance Use Topics  . Smoking status: Current Some Day Smoker    Types: Cigarettes  . Smokeless tobacco: None  . Alcohol Use: Yes    Review of Systems  Constitutional: Negative for appetite change.  Respiratory: Negative for shortness of breath.   Cardiovascular: Negative for chest pain.  Gastrointestinal: Negative for abdominal pain.  Genitourinary: Negative for flank pain.  Skin: Positive for wound.  Neurological: Negative for weakness and numbness.  Psychiatric/Behavioral: Positive for suicidal ideas.      Allergies  Review of patient's allergies indicates no known allergies.  Home Medications   Prior to Admission medications   Medication Sig Start Date End Date Taking? Authorizing Provider  aspirin EC 81 MG tablet Take 81 mg by mouth daily.   Yes Historical Provider, MD  Aspirin-Salicylamide-Caffeine (BC HEADACHE POWDER PO) Take 1 packet by mouth every 6 (six) hours as needed (for pain.).   Yes Historical  Provider, MD  hydrALAZINE (APRESOLINE) 100 MG tablet Take 100 mg by mouth every 8 (eight) hours.    Yes Historical Provider, MD  hydrOXYzine (ATARAX/VISTARIL) 50 MG tablet Take 50 mg by mouth 3 (three) times daily as needed (for anxiety).   Yes Historical Provider, MD  isosorbide mononitrate (IMDUR) 120 MG 24 hr tablet Take 120 mg by mouth daily.   Yes Historical Provider, MD  lisinopril (PRINIVIL,ZESTRIL) 40 MG tablet Take 40 mg by mouth daily.   Yes Historical Provider, MD  metoprolol (LOPRESSOR) 100 MG tablet Take 100 mg by mouth 2 (two) times daily.   Yes Historical Provider, MD  traZODone (DESYREL) 150 MG tablet Take 150 mg by mouth at bedtime.   Yes Historical Provider, MD  ziprasidone (GEODON) 80 MG capsule Take 80 mg by mouth 2 (two) times daily.   Yes Historical Provider, MD   BP 145/71 mmHg  Pulse 68  Temp(Src) 98.4 F (36.9 C) (Oral)  Resp 25  SpO2 96% Physical Exam  Constitutional: He appears well-developed.  Patient is obese.  HENT:  Head: Normocephalic.  Cardiovascular: Normal rate.   Pulmonary/Chest: Effort normal.  Abdominal: Soft. There is no tenderness.  Musculoskeletal:  Multiple horizontal laceration superficially across both wrists. Only one goes all the way through the skin and that is on the left wrist. Good sensation over bilateral hands. Good flexion and extension of all the fingers and flexion extension at the wrist. Strong radial pulse bilaterally. There is approximately  a 7-1/2 cm cut horizontally across the left wrist that is more deep. It will be sutured closed.  Skin: Skin is warm. He is diaphoretic.  Psychiatric:  Patient is tearful.    ED Course  Procedures (including critical care time) Labs Review Labs Reviewed - No data to display  Imaging Review No results found. I have personally reviewed and evaluated these images and lab results as part of my medical decision-making.   EKG Interpretation None      MDM   Final diagnoses:   Schizophrenia, paranoid type (HCC)  Suicide attempt [facility])  Wrist laceration, left, initial encounter    Patient is suicidal and slit his wrists in the waiting room. Most of this cuts were superficial but there was a deeper one on the left wrist/forearm that was closed. No apparent tendon involvement. Sutures need to be removed in around 10 days. Medically cleared at this time. Involuntary committed by myself.     Benjiman Core, MD 02/16/16 1529  LACERATION REPAIR Performed by: Billee Cashing Authorized by: Billee Cashing Consent: Verbal consent obtained. Risks and benefits: risks, benefits and alternatives were discussed Consent given by: patient Patient identity confirmed: provided demographic data Prepped and Draped in normal sterile fashion Wound explored  Laceration Location: left forearm  Laceration Length: 7.5cm  No Foreign Bodies seen or palpated  Anesthesia: local infiltration  Local anesthetic: lidocaine 1% wth neut  Anesthetic total: 2ml  Irrigation method: syringe Amount of cleaning: standard  Skin closure: 4-0 vicryl rapide  Number of sutures: 9  Technique: simple interrupted  Patient tolerance: Patient tolerated the procedure well with no immediate complications.  Benjiman Core, MD 02/16/16 1537  Patient's been accepted at behavioral health by Dr. Elna Breslow.  Benjiman Core, MD 02/16/16 [phone]

## 2016-02-16 NOTE — ED Notes (Signed)
IVC PAPERS PRESENT 

## 2016-02-16 NOTE — ED Notes (Signed)
[facility]  Report has been called to [employer]ather RN.  However, Pt cannot be transported until paperwork is served.  Pt must be transported by [employer].  Heather RN asked that we call [phone], when Pt leaves.

## 2016-02-16 NOTE — BH Assessment (Signed)
BHH Assessment Progress Note  Per Carolanne GrumblingGerald Taylor, MD, this pt does not require psychiatric hospitalization at this time.  [facility] health and substance abuse treatment resources.  Discharge instructions advise pt to follow up with [facility], [facility], and [facility].  Pt's nurse, Diane, has been notified.  Timothy Canninghomas Sabien Umland, MA Triage Specialist [phone]

## 2016-02-16 NOTE — Progress Notes (Signed)
Adult Psychoeducational Group Note  Date:  02/16/2016 Time:  8:31 PM  Group Topic/Focus:  Wrap-Up Group:   The focus of this group is to help patients review their daily goal of treatment and discuss progress on daily workbooks.  Participation Level:  Active  Participation Quality:  Appropriate  Affect:  Appropriate  Cognitive:  Appropriate  Insight: Appropriate  Engagement in Group:  Engaged  Modes of Intervention:  Discussion  Additional Comments:  The patient recently arrived and did not attend any groups.  Octavio Mannshigpen, Taunia Frasco Lee 02/16/2016, 8:31 PM

## 2016-02-16 NOTE — BH Assessment (Signed)
BHH Assessment Progress Note  Per Carolanne GrumblingGerald Taylor, MD, this pt requires psychiatric hospitalization at this time.  Lillia AbedLindsay, RN, [facility]m [facility]; they are ready for pt to be transported as of this writing.  EDP reportedly initiated IVC, and Findings and Custody Order has been served.[place].  [facility] charge nurse, Lavetta NielsenMacan, has has been notified.  Please call report to [phone].  Pt is to be transported via [employer] examiner[employer] enforcement.  Doylene Canninghomas Tanee Henery, MA Triage Specialist [phone]

## 2016-02-16 NOTE — ED Notes (Signed)
Pt presents after a SI attempt w/ a razor.  Lacerations noted to bilateral wrists.  Pt remains suicidal.  Denies HI/AV.  Pt continually repeats that he has issues and is here to get help.  Pt reports that a group of people want him dead.  Sts "they have a Facebook group or something.  He knows what I'm talking about, but he has to act like he doesn't (while pointing to officer)."        Pt was discharged from [facility] earlier today.  Sts "the doctor told me that he was going to keep me and then, they came in a kicked me out."

## 2016-02-16 NOTE — ED Notes (Signed)
Pt was very upset to be discharged and at the time of discharge he cried and called this writer, "mean" and a "demon". He refused to sign for his discharged and refused to hear pt education about resources. He repeatedly told Dr. Ladona Ridgelaylor that this visit is not about needing a place to stay. He said that he has disability and can stay in a motel, or stay with a friend. When discharged he asked various people what he was supposed to do when he came here for help and there was no help for him here. A few times he alluded that he just cannot go back to the shelter. His belongings were returned, but he would not sign for them. His mother called several times and was very upset that he is being discharged. This pt spoke with Dr. Ladona Ridgelaylor several times on pt's behalf, to make sure that discharge was the correct action, and Dr. Ladona Ridgelaylor was confident that discharge was the correct action.

## 2016-02-16 NOTE — ED Notes (Signed)
Per chart review, [facility] this [facility]/afternoon.

## 2016-02-16 NOTE — Discharge Instructions (Signed)
For your ongoing mental health needs and substance abuse treatment needs, you are advised to follow up with the following providers:       [facility]      [address]      [place], [place] [address]      [phone]      [facility] provides mental health treatment.  New and returning patients are seen at their walk-in clinic.  Walk-in hours are Monday - Friday from 8:00 am - 3:00 pm.  Walk-in patients are seen on a first come, first served basis.  Try to arrive as early as possible for he best chance of being seen the same day.        [facility]      [address]      [place], [place] [address]      [phone]      Family [facility] provides both mental health and substance abuse treatment.  New patients are seen at their walk-in clinic.  Walk-in hours are Monday - Friday from 8:00 am - 12:00 pm, and from 1:00 pm - 3:00 pm.  Walk-in patients are seen on a first come, first served basis, so try to arrive as early as possible for the best chance of being seen the same day.  There is an initial fee of $22.50.        [facility]      [address]      [place], [place] [address]      [phone]      [facility] provides substance abuse treatment.  New patients are seen at the walk-in clinic every Tuesday from 9:00 am - 12:00 pm.

## 2016-02-16 NOTE — Consult Note (Signed)
[place] Psychiatry Consult   Reason for Consult:  Paranoia  Referring Physician:  EDP Patient Identification: Timothy Jackson MRN:  [id number] Principal Diagnosis: Paranoia [facility]) Diagnosis:   Patient Active Problem List   Diagnosis Date Noted  . Paranoia ([place]) [F22] 02/16/2016    Priority: High  . Schizophrenia, paranoid type ([place]) [F20.0] 02/15/2016  . Paranoid behavior ([place]) [F22]   . 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 male patient does not warrant admission.  HPI:  45 yo male who presented to the ED with paranoia and having thoughts he might hurt someone.  Today, he denies suicidal/homicidal ideations, hallucinations, and current alcohol/drug abuse.  His biggest issue is being homeless as his mother loves him but can no longer have him live with her due to her cancer and his high demands.  He left [place] on 5/19 to go to the [facility] to live but came to the ED requesting to go to "[facility]".  He does not meet criteria for any inpatient.  THen, he reported having substance abuse issues but has not drank in over a week.  Past Psychiatric History: schizophrenia  Risk to Self: Suicidal Ideation: Yes-Currently Present Suicidal Intent: No Is patient at risk for suicide?: No Suicidal Plan?: No Access to Means: Yes Specify Access to Suicidal Means: Pt has pocket knife What has been your use of drugs/alcohol within the last 12 months?: Current How many times?: 0 Other Self Harm Risks: none Triggers for Past Attempts: Unknown Intentional Self Injurious Behavior: None Risk to Others: Homicidal Ideation: No Thoughts of Harm to Others: No Current Homicidal Intent: No Current Homicidal Plan: No Access to Homicidal Means: No Identified Victim: na History of harm to others?: No Assessment of Violence: None Noted Violent Behavior Description: na Does patient have access to  weapons?: No (left knife in [place]) Criminal Charges Pending?: No Does patient have a court date: No Prior Inpatient Therapy: Prior Inpatient Therapy: Yes Prior Therapy Dates: 2017 Prior Therapy Facilty/Provider(s): [facility] Reason for Treatment: PTSD, Paranoia Prior Outpatient Therapy: Prior Outpatient Therapy: No Prior Therapy Dates: na Prior Therapy Facilty/Provider(s): [facility] Reason for Treatment: PTSD, paranoia Does patient have an ACCT team?: No Does patient have Intensive In-House Services?  : No Does patient have [facility] services? : Yes (pt states "probably") Does patient have [facility] services?: No  Past Medical History:  Past Medical History  Diagnosis Date  . Paranoid ([place])   . AVM (arteriovenous malformation)   . Hypertension    History reviewed. No pertinent past surgical history. Family History: History reviewed. No pertinent family history. Family Psychiatric  History: none Social History:  History  Alcohol Use  . Yes     History  Drug Use  . Yes  . Special: Marijuana    Social History   Social History  . Marital Status: Single    Spouse Name: N/A  . Number of Children: N/A  . Years of Education: N/A   Social History Main Topics  . Smoking status: Current Every Day Smoker  . Smokeless tobacco: None  . Alcohol Use: Yes  . Drug Use: Yes    Special: Marijuana  . Sexual Activity: Not Asked   Other Topics Concern  . None   Social History Narrative  . None   Additional Social History:    Allergies:  No Known Allergies  Labs:  Results for orders placed or  performed during the hospital encounter of 02/14/16 (from the past 48 hour(s))  Ethanol     Status: None   Collection Time: 02/14/16  1:06 PM  Result Value Ref Range   Alcohol, Ethyl (B) <5 <5 mg/dL    Comment:        LOWEST DETECTABLE LIMIT FOR SERUM ALCOHOL IS 5 mg/dL FOR MEDICAL PURPOSES ONLY   Salicylate level     Status: None   Collection Time: 02/14/16  1:06 PM  Result Value Ref Range    Salicylate Lvl <4.7 2.8 - 30.0 mg/dL  Acetaminophen level     Status: Abnormal   Collection Time: 02/14/16  1:06 PM  Result Value Ref Range   Acetaminophen (Tylenol), Serum <10 (L) 10 - 30 ug/mL    Comment:        THERAPEUTIC CONCENTRATIONS VARY SIGNIFICANTLY. A RANGE OF 10-30 ug/mL MAY BE AN EFFECTIVE CONCENTRATION FOR MANY PATIENTS. HOWEVER, SOME ARE BEST TREATED AT CONCENTRATIONS OUTSIDE THIS RANGE. ACETAMINOPHEN CONCENTRATIONS >150 ug/mL AT 4 HOURS AFTER INGESTION AND >50 ug/mL AT 12 HOURS AFTER INGESTION ARE OFTEN ASSOCIATED WITH TOXIC REACTIONS.   Comprehensive metabolic panel     Status: Abnormal   Collection Time: 02/14/16  1:24 PM  Result Value Ref Range   Sodium 138 135 - 145 mmol/L   Potassium 3.6 3.5 - 5.1 mmol/L   Chloride 102 101 - 111 mmol/L   CO2 29 22 - 32 mmol/L   Glucose, Bld 126 (H) 65 - 99 mg/dL   BUN 27 (H) 6 - 20 mg/dL   Creatinine, Ser 1.57 (H) 0.61 - 1.24 mg/dL   Calcium 9.2 8.9 - 10.3 mg/dL   Total Protein 7.5 6.5 - 8.1 g/dL   Albumin 4.4 3.5 - 5.0 g/dL   AST 24 15 - 41 U/L   ALT 33 17 - 63 U/L   Alkaline Phosphatase 74 38 - 126 U/L   Total Bilirubin 0.3 0.3 - 1.2 mg/dL   GFR calc non Af Amer 51 (L) >60 mL/min   GFR calc Af Amer 59 (L) >60 mL/min    Comment: (NOTE) The eGFR has been calculated using the CKD EPI equation. This calculation has not been validated in all clinical situations. eGFR's persistently <60 mL/min signify possible Chronic Kidney Disease.    Anion gap 7 5 - 15  cbc     Status: None   Collection Time: 02/14/16  1:24 PM  Result Value Ref Range   WBC 5.8 4.0 - 10.5 K/uL   RBC 5.13 4.22 - 5.81 MIL/uL   Hemoglobin 13.6 13.0 - 17.0 g/dL   HCT 42.0 39.0 - 52.0 %   MCV 81.9 78.0 - 100.0 fL   MCH 26.5 26.0 - 34.0 pg   MCHC 32.4 30.0 - 36.0 g/dL   RDW 13.7 11.5 - 15.5 %   Platelets 298 150 - 400 K/uL    Current Facility-Administered Medications  Medication Dose Route Frequency Provider Last Rate Last Dose  .  acetaminophen (TYLENOL) tablet 650 mg  650 mg Oral Q4H PRN Forde Dandy, MD   650 mg at 02/15/16 0816  . alum & mag hydroxide-simeth (MAALOX/MYLANTA) 200-200-20 MG/5ML suspension 30 mL  30 mL Oral PRN Forde Dandy, MD      . aspirin EC tablet 81 mg  81 mg Oral Daily Delfin Gant, NP   81 mg at 02/16/16 1025  . hydrALAZINE (APRESOLINE) tablet 100 mg  100 mg Oral Q8H Delfin Gant, NP  100 mg at 02/16/16 9528  . hydrOXYzine (ATARAX/VISTARIL) tablet 50 mg  50 mg Oral TID PRN Delfin Gant, NP   50 mg at 02/15/16 1633  . isosorbide mononitrate (IMDUR) 24 hr tablet 120 mg  120 mg Oral Daily Delfin Gant, NP   120 mg at 02/16/16 1025  . lisinopril (PRINIVIL,ZESTRIL) tablet 40 mg  40 mg Oral Daily Delfin Gant, NP   40 mg at 02/16/16 1025  . LORazepam (ATIVAN) tablet 1 mg  1 mg Oral Q8H PRN Forde Dandy, MD   1 mg at 02/14/16 1918  . metoprolol tartrate (LOPRESSOR) tablet 100 mg  100 mg Oral BID Delfin Gant, NP   100 mg at 02/16/16 1025  . ondansetron (ZOFRAN) tablet 4 mg  4 mg Oral Q8H PRN Forde Dandy, MD      . traZODone (DESYREL) tablet 150 mg  150 mg Oral QHS Delfin Gant, NP   150 mg at 02/15/16 2153  . ziprasidone (GEODON) capsule 80 mg  80 mg Oral BID WC Delfin Gant, NP   80 mg at 02/16/16 4132   Current Outpatient Prescriptions  Medication Sig Dispense Refill  . aspirin EC 81 MG tablet Take 81 mg by mouth daily.    . Aspirin-Salicylamide-Caffeine (BC HEADACHE POWDER PO) Take 1 packet by mouth every 6 (six) hours as needed (for pain.).    Marland Kitchen hydrALAZINE (APRESOLINE) 100 MG tablet Take 100 mg by mouth every 8 (eight) hours.     . hydrOXYzine (ATARAX/VISTARIL) 50 MG tablet Take 50 mg by mouth 3 (three) times daily as needed (for anxiety).    . isosorbide mononitrate (IMDUR) 120 MG 24 hr tablet Take 120 mg by mouth daily.    Marland Kitchen lisinopril (PRINIVIL,ZESTRIL) 40 MG tablet Take 40 mg by mouth daily.    . metoprolol (LOPRESSOR) 100 MG tablet Take 100 mg  by mouth 2 (two) times daily.    . traZODone (DESYREL) 150 MG tablet Take 150 mg by mouth at bedtime.    . ziprasidone (GEODON) 80 MG capsule Take 80 mg by mouth 2 (two) times daily.      Musculoskeletal: Strength & Muscle Tone: within normal limits Gait & Station: normal Patient leans: N/A  Psychiatric Specialty Exam: Physical Exam  Constitutional: He is oriented to person, place, and time. He appears well-developed and well-nourished.  HENT:  Head: Normocephalic.  Neck: Normal range of motion.  Respiratory: Effort normal.  Musculoskeletal: Normal range of motion.  Neurological: He is oriented to person, place, and time.  Skin: Skin is warm and dry.    Review of Systems  Constitutional: Negative.   HENT: Negative.   Eyes: Negative.   Respiratory: Negative.   Cardiovascular: Negative.   Gastrointestinal: Negative.   Genitourinary: Negative.   Musculoskeletal: Negative.   Skin: Negative.   Neurological: Negative.   Endo/Heme/Allergies: Negative.   Psychiatric/Behavioral: Positive for substance abuse.    Blood pressure 123/88, pulse 64, temperature 97.9 F (36.6 C), temperature source Oral, resp. rate 20, SpO2 98 %.There is no height or weight on file to calculate BMI.  General Appearance: Casual  Eye Contact:  Good  Speech:  Normal Rate  Volume:  Normal  Mood:  Irritable  Affect:  Congruent  Thought Process:  Coherent  Orientation:  Full (Time, Place, and Person)  Thought Content:  paranoia, mild occasionally  Suicidal Thoughts:  No  Homicidal Thoughts:  No  Memory:  Immediate;   Good Recent;   Good  Remote;   Good  Judgement:  Fair  Insight:  Fair  Psychomotor Activity:  Normal  Concentration:  Concentration: Good and Attention Span: Good  Recall:  Good  Fund of Knowledge:  Good  Language:  Good  Akathisia:  No  Handed:  Right  AIMS (if indicated):     Assets:  Leisure Time Physical Health Resilience  ADL's:  Intact  Cognition:  WNL  Sleep:       Treatment Plan Summary: Daily contact with patient to assess and evaluate symptoms and progress in treatment, Medication management and Plan Paranoia  -Crisis stabilization -Medication management:  Continued Geodon 80 mg BID for psychosis, Vistaril 50 mg TID PRN anxiety, and Trazodone 150 mg at bedtime for sleep along with medical medications -Individual counseling -Homeless resources.  Disposition: No evidence of imminent risk to self or others at present.    Waylan Boga, NP 02/16/2016 11:43 AM

## 2016-02-16 NOTE — Progress Notes (Signed)
Patient ID: Timothy Jackson, male   DOB: 1970/10/16, 45 y.o.   MRN: [id number] Per State regulations 482.30 this chart was reviewed for medical necessity with respect to the patient's admission/duration of stay.    Next review date: 02/20/16  Thurman CoyerEric Charita Lindenberger, BSN, RN-BC  Case Manager

## 2016-02-17 ENCOUNTER — Encounter (HOSPITAL_COMMUNITY): Payer: Self-pay | Admitting: Psychiatry

## 2016-02-17 DIAGNOSIS — Z8774 Personal history of (corrected) congenital malformations of heart and circulatory system: Secondary | ICD-10-CM

## 2016-02-17 DIAGNOSIS — F333 Major depressive disorder, recurrent, severe with psychotic symptoms: Secondary | ICD-10-CM | POA: Diagnosis present

## 2016-02-17 DIAGNOSIS — F1220 Cannabis dependence, uncomplicated: Secondary | ICD-10-CM | POA: Diagnosis present

## 2016-02-17 DIAGNOSIS — I10 Essential (primary) hypertension: Secondary | ICD-10-CM | POA: Diagnosis present

## 2016-02-17 MED ORDER — HALOPERIDOL 5 MG PO TABS
10.0000 mg | ORAL_TABLET | Freq: Three times a day (TID) | ORAL | Status: DC | PRN
Start: 2016-02-17 — End: 2016-02-22

## 2016-02-17 MED ORDER — BUPROPION HCL 75 MG PO TABS
75.0000 mg | ORAL_TABLET | Freq: Every day | ORAL | Status: DC
  Administered 2016-02-17 – 2016-02-18 (×2): 75 mg via ORAL
  Filled 2016-02-17 (×5): qty 1

## 2016-02-17 MED ORDER — CLONAZEPAM 0.5 MG PO TABS
0.5000 mg | ORAL_TABLET | Freq: Three times a day (TID) | ORAL | Status: DC
  Administered 2016-02-17 – 2016-02-22 (×16): 0.5 mg via ORAL
  Filled 2016-02-17 (×16): qty 1

## 2016-02-17 MED ORDER — HYDRALAZINE HCL 25 MG PO TABS
ORAL_TABLET | ORAL | Status: AC
  Filled 2016-02-17: qty 4

## 2016-02-17 MED ORDER — HALOPERIDOL LACTATE 5 MG/ML IJ SOLN: 10.0000 mg | Freq: Three times a day (TID) | INTRAMUSCULAR | Status: DC | PRN

## 2016-02-17 MED ORDER — DIPHENHYDRAMINE HCL 25 MG PO CAPS: 50.0000 mg | ORAL_CAPSULE | Freq: Three times a day (TID) | ORAL | Status: DC | PRN

## 2016-02-17 MED ORDER — DIPHENHYDRAMINE HCL 50 MG/ML IJ SOLN: 50.0000 mg | Freq: Three times a day (TID) | INTRAMUSCULAR | Status: DC | PRN

## 2016-02-17 MED ORDER — ZIPRASIDONE HCL 20 MG PO CAPS
100.0000 mg | ORAL_CAPSULE | Freq: Two times a day (BID) | ORAL | Status: DC
  Administered 2016-02-17 – 2016-02-19 (×4): 100 mg via ORAL
  Filled 2016-02-17 (×6): qty 5

## 2016-02-17 NOTE — BHH Group Notes (Signed)
BHH Group Notes:  (Nursing/MHT/Case Management/Adjunct)  Date:  02/17/2016  Time:  4:13 PM  Type of Therapy:  Nurse Education  Participation Level:  Did Not Attend   Timothy Jackson 02/17/2016, 4:13 PM
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
## 2016-02-17 NOTE — Progress Notes (Signed)
DAR NOTE: Patient presents with anxious affect and depressed mood.  Denies pain, auditory and visual hallucinations.  Rates depression at 10, hopelessness at 10, and anxiety at 10.  Maintained on routine safety checks.  Medications given as prescribed.  Support and encouragement offered as needed.  Patient remained in his room most of the shift.  No interaction with peers.  Patient very paranoid and suspicious of people around him.  Patient with complaints that people are talking about him and calling him a liar.  Patient redirected and encouraged to use his coping skills.
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
## 2016-02-17 NOTE — [facility] Group Notes (Signed)
[facility] LCSW Group Therapy  02/17/2016 1:15 pm  Type of Therapy: Process Group Therapy  Participation Level:  Active  Participation Quality:  Appropriate  Affect:  Flat  Cognitive:  Oriented  Insight:  Improving  Engagement in Group:  Limited  Engagement in Therapy:  Limited  Modes of Intervention:  Activity, Clarification, Education, Problem-solving and Support  Summary of Progress/Problems: Today's group addressed the issue of overcoming obstacles.  Patients were asked to identify their biggest obstacle post d/c that stands in the way of their on-going success, and then problem solve as to how to manage this. Invited.  In bed asleep.  Daryel Geraldorth, Deval Mroczka B 02/17/2016   2:10 PM

## 2016-02-17 NOTE — [facility] Suicide Risk Assessment (Signed)
[facility]   Nursing information obtained from:  Patient Demographic factors:  Male, Low socioeconomic status, Living alone Current Mental Status:  Self-harm behaviors Loss Factors:  Decline in physical health, Financial problems / change in socioeconomic status Historical Factors:  Family history of mental illness or substance abuse, Impulsivity Risk Reduction Factors:  NA  Total Time spent with patient: 30 minutes Principal Problem: MDD (major depressive disorder), recurrent, severe, with psychosis (HCC) Diagnosis:   Patient Active Problem List   Diagnosis Date Noted  . MDD (major depressive disorder), recurrent, severe, with psychosis (HCC) [F33.3] 02/17/2016  . Cannabis use disorder, severe, dependence (HCC) [F12.20] 02/17/2016  . Essential hypertension [I10] 02/17/2016  . History of arteriovenous malformation (AVM) [Z86.79] 02/17/2016  . Morbid obesity (HCC) [E66.01] 02/17/2016   Subjective Data:Please see H&P.   Continued Clinical Symptoms:  Alcohol Use Disorder Identification Test Final Score (AUDIT): 1 The "Alcohol Use Disorders Identification Test", Guidelines for Use in Primary Care, Second Edition.  World Science writerHealth Organization [facility](WHO). Score between 0-7:  no or low risk or alcohol related problems. Score between 8-15:  moderate risk of alcohol related problems. Score between 16-19:  high risk of alcohol related problems. Score 20 or above:  warrants further diagnostic evaluation for alcohol dependence and treatment.   CLINICAL FACTORS:   Depression:   Impulsivity Alcohol/Substance Abuse/Dependencies Currently Psychotic   Musculoskeletal: Strength & Muscle Tone: within normal limits Gait & Station: normal Patient leans: N/A  Psychiatric Specialty Exam: Physical Exam  Nursing note and vitals reviewed.   Review of Systems  Psychiatric/Behavioral: Positive for depression, suicidal ideas and substance abuse. The patient is nervous/anxious.    All other systems reviewed and are negative.   Blood pressure 168/106, pulse 95, temperature 97.7 F (36.5 C), temperature source Oral, resp. rate 16, height 5\' 9"  (1.753 m).There is no weight on file to calculate BMI.                      Please see H&P.                                     COGNITIVE FEATURES THAT CONTRIBUTE TO RISK:  Closed-mindedness, Polarized thinking and Thought constriction (tunnel vision)    SUICIDE RISK:   Extreme:  Frequent, intense, and enduring suicidal ideation, specific plans, clear subjective and objective intent, impaired self-control, severe dysphoria/symptomatology, many risk factors and no protective factors.  PLAN OF CARE: Please see H&P.   I certify that inpatient services furnished can reasonably be expected to improve the patient's condition.   [name], MD 02/17/2016, 1:14 PM

## 2016-02-17 NOTE — Plan of Care (Signed)
Problem: Health Behavior/Discharge Planning: Goal: Compliance with prescribed medication regimen will improve Outcome: Progressing Pt has been compliant with medications tonight.    

## 2016-02-17 NOTE — Progress Notes (Signed)
Adult Psychoeducational Group Note  Date:  02/17/2016 Time:  8:15 PM  Group Topic/Focus:  Wrap-Up Group:   The focus of this group is to help patients review their daily goal of treatment and discuss progress on daily workbooks.  Participation Level:  Did Not Attend  Pt did not attend wrap-up group.    Cleotilde NeerJasmine S Eliette Drumwright 02/17/2016, 8:32 PM

## 2016-02-18 LAB — BASIC METABOLIC PANEL
Anion gap: 8 (ref 5–15)
BUN: 19 mg/dL (ref 6–20)
CALCIUM: 9.4 mg/dL (ref 8.9–10.3)
CO2: 27 mmol/L (ref 22–32)
CREATININE: 1.39 mg/dL — AB (ref 0.61–1.24)
Chloride: 105 mmol/L (ref 101–111)
GFR calc non Af Amer: 59 mL/min — ABNORMAL LOW (ref >60)
Glucose, Bld: 114 mg/dL — ABNORMAL HIGH (ref 65–99)
Potassium: 4.3 mmol/L (ref 3.5–5.1)
SODIUM: 140 mmol/L (ref 135–145)

## 2016-02-18 MED ORDER — CLONAZEPAM 1 MG PO TABS
1.0000 mg | ORAL_TABLET | Freq: Once | ORAL | Status: AC
  Administered 2016-02-18: 1 mg via ORAL
  Filled 2016-02-18: qty 1

## 2016-02-18 MED ORDER — BUPROPION HCL ER (XL) 150 MG PO TB24
150.0000 mg | ORAL_TABLET | Freq: Every day | ORAL | Status: DC
  Administered 2016-02-19 – 2016-02-21 (×3): 150 mg via ORAL
  Filled 2016-02-18 (×5): qty 1

## 2016-02-18 MED ORDER — ARIPIPRAZOLE 5 MG PO TABS
5.0000 mg | ORAL_TABLET | Freq: Every day | ORAL | Status: DC
  Administered 2016-02-19: 5 mg via ORAL
  Filled 2016-02-18 (×3): qty 1

## 2016-02-18 MED ORDER — AMLODIPINE BESYLATE 5 MG PO TABS
5.0000 mg | ORAL_TABLET | Freq: Every day | ORAL | Status: DC
  Administered 2016-02-18 – 2016-02-20 (×3): 5 mg via ORAL
  Filled 2016-02-18 (×6): qty 1

## 2016-02-18 MED ORDER — BENZTROPINE MESYLATE 1 MG PO TABS
0.5000 mg | ORAL_TABLET | Freq: Every day | ORAL | Status: DC
  Administered 2016-02-18 – 2016-02-21 (×4): 0.5 mg via ORAL
  Filled 2016-02-18 (×6): qty 1

## 2016-02-18 NOTE — Progress Notes (Addendum)
D: Pt presents anxious with flat affect on initial contact. Speech remains circumstantial. Pt is guarded, suspicious and paranoid, continue to blame others about discharging him from [facility]. [facility] stated to writer "I need to go there, that's where Dr. Demetrius CharityP told me I need to be".  Dressing intact to bilateral arms.  A: All medications administered as per MD's orders. Emotional support, availability and encouragement provided to pt. Q 15 minutes checks maintained for safety without outburst or self injurious behavior to note thus far.  R: Pt denies SI, HI, AVH and pain when assessed. Reported VH of seeing people standing in his room when he woke up last night "but they faded away, they were not talking to me or anything". Compliant with medications when offered. Remains safe on and off unit.

## 2016-02-18 NOTE — Progress Notes (Signed)
Adult Psychoeducational Group Note  Date:  02/18/2016 Time:  8:10 PM  Group Topic/Focus:  Wrap-Up Group:   The focus of this group is to help patients review their daily goal of treatment and discuss progress on daily workbooks.  Participation Level:  Did Not Attend  Pt did not attend wrap-up group.    Cleotilde NeerJasmine S [place] 02/18/2016, 8:21 PM

## 2016-02-18 NOTE — Progress Notes (Signed)
Pt reports having visual hallucinations of "people standing right there.  I see them and then they fade away.  They're not scary or anything like that, it's just weird, maybe it's from a new med."   Pt reports he feels like his blood pressure is elevated as well.  Pt's blood pressure is 161/99.  On-site provider notified and Klonopin 1 mg PO X1 ordered and administered.  Norvasc 5 mg PO ordered and administered.  Will continue to monitor and assess.

## 2016-02-18 NOTE — Progress Notes (Signed)
D: Pt presents with anxious affect and mood.  Pt is paranoid and stayed in his room for the majority of the night.  He reports he is worried that other people are talking about him.  When asked what his goal for the day was, pt states "I don't know man."  When asked if he is having thoughts of harming himself or others, pt states "I don't know what I'm thinking."  Pt states "my mind is not the same as when I got here."  Pt reports bilateral wrist pain of 8/10.  Pt did not Designer, multimediaanswer writer when he was asked if he is having hallucinations.  Pt did not attend evening group.    A: Actively listened to pt and offered support and encouragement.  Medication education provided.  Medications administered per order.  PRN medication administered for pain.  PO fluids encouraged and provided.  Reassured pt that he is safe.  R: Pt is compliant with medications.  He verbally contracts for safety and reports that he will inform staff of needs and concerns.  Will continue to monitor and assess.

## 2016-02-18 NOTE — Tx Team (Signed)
Interdisciplinary Treatment Plan Update (Adult)  Date:  02/18/2016   Time Reviewed:  8:43 AM   Progress in Treatment: Attending groups: No Participating in groups:  No Taking medication as prescribed:  Yes. Tolerating medication:  Yes. Family/Significant other contact made:  No Patient understands diagnosis:  No  Limited insight Discussing patient identified problems/goals with staff:  Yes, see initial care plan. Medical problems stabilized or resolved:  Yes. Denies suicidal/homicidal ideation: Yes. Issues/concerns per patient self-inventory:  No. Other:  New problem(s) identified:  Discharge Plan or Barriers: see below  Reason for Continuation of Hospitalization: Medication stabilization Other; describe Delusions, Paranoia  Comments:  Pt brought in by [employer], "I need the time at [facility]" Discharged 2 days ago, on list to go to [place], left before transferred there, Now wants to get help so he can spend time with his son."[place] was a crazy day" "I might have hurt someone yesterday" "I feel that I am being watched all the time" Rambling conversation, speaking of Paranoia, he thinks people are coming here to harm him. Pt feels people will come to get him when they find out he is here. Pt continues to be extremely delusional and paranoid, feels unsafe outside of the hospital. Will continue treatment. Will continue Geodon - increase to 100 mg po bid for psychosis. Will start Wellbutrin 75 mg po daily for affective sx, with plan to titrate higher. Will add Klonopin 0.5 mg po tid for anxiety sx. Will make available PRN medications as per agitation protocol. Will continue Trazodone 150 mg po qhs for sleep.  Estimated length of stay: 4-5 days  New goal(s):  Review of initial/current patient goals per problem list:   Review of initial/current patient goals per problem list:  1. Goal(s): Patient will participate in aftercare plan   Met: No   Target date: 3-5 days post admission  date   As evidenced by: Patient will participate within aftercare plan AEB aftercare provider and housing plan at discharge being identified. 02/18/16:  Pt states only lan he is interested in is going to [facility]      5. Goal(s): Patient will demonstrate decreased signs of psychosis  * Met: No  * Target date: 3-5 days post admission date  * As evidenced by: Patient will demonstrate decreased frequency of AVH or return to baseline function 02/18/16:  Pt presents with delusions, paranoia         Attendees: Patient:  02/18/2016 8:43 AM   Family:   02/18/2016 8:43 AM   Physician:  Ursula Alert, MD 02/18/2016 8:43 AM   Nursing:   Phillis Haggis, RN 02/18/2016 8:43 AM   CSW:    Roque Lias, LCSW   02/18/2016 8:43 AM   Other:  02/18/2016 8:43 AM   Other:   02/18/2016 8:43 AM   Other:  Lars Pinks, Nurse CM 02/18/2016 8:43 AM   Other:   02/18/2016 8:43 AM   Other:  Norberto Sorenson, [place]  02/18/2016 8:43 AM   Other:  02/18/2016 8:43 AM   Other:  02/18/2016 8:43 AM   Other:  02/18/2016 8:43 AM   Other:  02/18/2016 8:43 AM   Other:  02/18/2016 8:43 AM   Other:   02/18/2016 8:43 AM    Scribe for Treatment Team:   Trish Mage, 02/18/2016 8:43 AM
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
## 2016-02-18 NOTE — [facility] Group Notes (Signed)
[facility] Group Therapy  02/18/2016 , 12:50 PM    Type of Therapy:  M[employer] Presentation  Participation Level:  Active  Participation Quality:  Attentive  Affect:  Blunted  Cognitive:  Oriented  Insight:  Limited  Engagement in Therapy:  Engaged  Modes of Intervention:  Discussion, Education and Socialization  Summary of Progress/Problems:  Timothy Jackson from [employer] came to present his recovery story and play the guitar.  Invited.  Chose to not attend.  Timothy Jackson, Timothy Jackson 02/18/2016 , 12:50 PM

## 2016-02-18 NOTE — Plan of Care (Signed)
Problem: Medication: Goal: Compliance with prescribed medication regimen will improve Outcome: Progressing Pt has been compliant with medications tonight.    

## 2016-02-18 NOTE — Progress Notes (Signed)
Recreation Therapy Notes  05.24.2017 Recreation therapy order acknowledged, LRT attempted to meet with patient, however patient was unable to stay awake long enough to speak with LRT. LRT will continue to attempt during admission. Marykay Lexenise L Yunuen Mordan, LRt/CTRS   Alwin Lanigan L 02/18/2016 4:14 PM

## 2016-02-19 MED ORDER — ZIPRASIDONE HCL 40 MG PO CAPS
40.0000 mg | ORAL_CAPSULE | Freq: Two times a day (BID) | ORAL | Status: DC
  Administered 2016-02-19 – 2016-02-20 (×2): 40 mg via ORAL
  Filled 2016-02-19 (×4): qty 1

## 2016-02-19 MED ORDER — ARIPIPRAZOLE 5 MG PO TABS
5.0000 mg | ORAL_TABLET | Freq: Every day | ORAL | Status: DC
  Administered 2016-02-19 – 2016-02-22 (×4): 5 mg via ORAL
  Filled 2016-02-19 (×7): qty 1

## 2016-02-19 NOTE — Progress Notes (Signed)
Recreation Therapy Notes  05.25.2017 approximately 8:15am. Per MD order patient to investigate ways to enhance tx during admission. Patient expressed significant paranoia, patient expressed numerous times during assessment he suspects other patients on unit and staff is talking about him and sharing information about his admission with people in the community. 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 Patient additionally shared that there is a private [employer] page designated to sharing rumors about him. As patient spoke with LRT if other patients walked near patient door he would stop talking and watch door intently, more than one time he accused other patients of listening at the door. Patient reports a desire to be [facility] because he feels he can [facility]. Patient additionally expressed that he will not "be able to manage" if he is d/c from unit, specifically that he will kill himself via cutting his wrists.   Patient expressed an interest in music during conversation. Due to patient admission LRT offered to provide patient with blank sheet music for him to write music, patient agreed to write a song for LRT. LRT encouraged patient to draw on his feelings and current experiences to write music. Patient agreed to participate in process.  Marykay Lexenise L Alexzandra Bilton, LRT/CTRS   Jacque Garrels L 02/19/2016 9:09 AM

## 2016-02-19 NOTE — Progress Notes (Signed)
1:1 progress note:  At this time, pt is lying in bed, propped up at a 70 degree angle, asleep.  Pt is snoring with labored breathing.  He continues on 1:1 for safety r/t suicidal thoughts.  Sitter at bedside  Pt safe at this time.

## 2016-02-19 NOTE — Progress Notes (Signed)
Pt has been in his room all evening.  He is on a 1:1 for safety as he reports that he was told he would be [facility].  He says he has not received the help he needs and would cut his wrists if he were discharged.  [facility], saying that it will take some time for him to get the help he needs.  He continues to endorse SI.  He denies HI.  He says he is still hearing voices at times.  Pt has been polite and appropriate on the unit otherwise.  He makes his needs known to staff.  His difficulty with breathing hampers his ability to relax and rest.  His BP continues to be elevated even with the high doses of anti-hypertensives he is taking.  Support and encouragement offered.  Discharge plans are in process.  Safety maintained with 1:1 observation.

## 2016-02-19 NOTE — Progress Notes (Signed)
Patient being monitored 1:1 patient still making suicidal comments such as "I don't think that this was enough" referring to cutting his wrists." Patient's safety has been maintained.  1:1 monitoring continued.

## 2016-02-19 NOTE — Progress Notes (Signed)
Patient has  Been monitored 1:1 with no self injurious behaviors.   Patient is unable to contract for safety continue to monitor per protocol.

## 2016-02-19 NOTE — Progress Notes (Signed)
Patient placed on 1:1 at 1045 for threatening to harm self.  Patient stated to both the nurse and the doctor "I feel like cutting my wrists was not enough.  Patient was unable to contract for safety and also said if this was not enough then I know I need to go further to get the help I need.  Patient endorses paranoia saying that people are after him and that the system is against him.  Patient states that he is in the 2% of people who are not crazy but really having things that others don't believe happen to him. Patient denies SI and HI, but continues to make suicidal statements.  Assess patient for safety, offer medications as prescribed, engage patient in 1:1 staff talks, monitor patient on 1:1   Patient's safety maintained.  Continue to monitor per 1:1 protocol.

## 2016-02-19 NOTE — Progress Notes (Signed)
Pt did not attend karaoke group this evening.  

## 2016-02-19 NOTE — Plan of Care (Signed)
Problem: Food- and Nutrition-Related Knowledge Deficit (NB-1.1) Goal: Nutrition education Formal process to instruct or train a patient/client in a skill or to impart knowledge to help patients/clients voluntarily manage or modify food choices and eating behavior to maintain or improve health. Outcome: Completed/Met Date Met:  02/19/16  RD consulted for nutrition education regarding weight loss.  There is no weight on file to calculate BMI. Pt meets criteria for obese class III based on observation  RD provided "Weight Loss Tips" handout from the Academy of Nutrition and Dietetics. Emphasized the importance of serving sizes and provided examples of correct portions of common foods. Discussed importance of controlled and consistent intake throughout the day. Provided examples of ways to balance meals/snacks and encouraged intake of high-fiber, whole grain complex carbohydrates. Emphasized the importance of hydration with calorie-free beverages and limiting sugar-sweetened beverages. Encouraged pt to discuss physical activity options with physician. Teach back method used.  Expect poor compliance.  Current diet order is heart healthy, patient is consuming approximately 100% of meals at this time. Labs and medications reviewed. No further nutrition interventions warranted at this time. RD contact information provided. If additional nutrition issues arise, please re-consult RD.  Timothy Jackson. Khaliya Golinski, MS, RD LDN Inpatient Clinical Dietitian Pager [phone]

## 2016-02-19 NOTE — Progress Notes (Signed)
Pt was in bed asleep at the beginning of the shift.  The head of his bed is in about a 60 degree angle to aid in his breathing.  When writer went to his room to introduce self, pt asked "Are you one of the bad ones or one of the other ones?", then fell back to sleep.  Writer left the room, then returned later when the MHT reported that the pt was awake.  Pt stated he was not really trying to kill himself when he cut his wrists, but was trying to get the attention he needed to get help.  He says that [place].  He says too many people know him here.  He is still very paranoid and suspicious.  He was educated on the medications that were ordered for him tonight.  He took all of them except the Abilify, stating that he wanted to see how he did without it tonight.  He was polite and cooperative with Clinical research associatewriter.  He said that he had dozed a lot today, and probably would not sleep much tonight.  Writer notes that pt does present with heavy, labored breathing.  Pt denies SI/HI/AVH at this time, but does say the he has "visions" that come and go.  Support and encouragement offered.  Pt was encouraged to speak with the MD about his desire to [facility] as it is at the MD discretion to send pt[facility].  Pt voices understanding.  Discharge plans are in process.  Safety maintained with q 15 minute checks.

## 2016-02-19 NOTE — Progress Notes (Signed)
[facility] MD Progress Note  02/19/2016 1:13 PM Timothy Jackson  MRN:  [id number] Subjective:  Pt states " I am not safe in this hospital, they know all about me. I want higher level of care . I need help.'   Objective:Timothy Jackson is a 45 y.o. AA male, who has a hx of delusional disorder, depression , who is on SSD , is single , lives in a motel in [place], who was brought in by [employer] to [facility] for extreme paranoia and feeling unsafe.  Patient seen and chart reviewed.Discussed patient with treatment team.  Pt today continues to be irrelevant , delusional , and continues to ruminate. Pt continues to be focussed mostly on his fixed delusions - states he feels unsafe outside the hospital. Pt reports he wants to be transferred to another facility since he does not feel like he is safe here. Pt reported to staff that he will kill himself if he were discharged. Pt also made threats to writer this AM that he knows what to do to get help. When asked about this - he does not elaborate.       Principal Problem: MDD (major depressive disorder), recurrent, severe, with psychosis ([place]) Diagnosis:   Patient Active Problem List   Diagnosis Date Noted  . MDD (major depressive disorder), recurrent, severe, with psychosis ([place]) [F33.3] 02/17/2016  . Cannabis use disorder, severe, dependence ([place]) [F12.20] 02/17/2016  . Essential hypertension [I10] 02/17/2016  . History of arteriovenous malformation (AVM) [Z86.79] 02/17/2016  . Morbid obesity ([place]) [E66.01] 02/17/2016   Total Time spent with patient: 30 minutes  Past Psychiatric History: Please see H&P.   Past Medical History:  Past Medical History  Diagnosis Date  . Paranoid ([place])   . AVM (arteriovenous malformation)   . Hypertension    History reviewed. No pertinent past surgical history. Family History:  Family History  Problem Relation Age of Onset  . Cancer Mother   . Bipolar disorder Maternal Aunt   . Bipolar disorder Maternal Aunt     Family Psychiatric  History: Please see H&P.  Social History:  History  Alcohol Use  . Yes     History  Drug Use  . Yes  . Special: Marijuana    Social History   Social History  . Marital Status: Single    Spouse Name: N/A  . Number of Children: N/A  . Years of Education: N/A   Social History Main Topics  . Smoking status: Current Some Day Smoker    Types: Cigarettes  . Smokeless tobacco: None  . Alcohol Use: Yes  . Drug Use: Yes    Special: Marijuana  . Sexual Activity: Not Asked   Other Topics Concern  . None   Social History Narrative   Additional Social History:    Pain Medications: See MAR Prescriptions: See MAR Over the Counter: See MAR History of alcohol / drug use?: Yes Longest period of sobriety (when/how long): 5 years Negative Consequences of Use: Financial Withdrawal Symptoms: Other (Comment) (Denies history of withdrawal) Name of Substance 1: Cannibis 1 - Age of First Use: 16 1 - Amount (size/oz): 2 grams   1 - Frequency: once a week 1 - Duration: on and off for years 1 - Last Use / Amount: 02/13/16                  Sleep: sleep during the day - stays up at night since he is paranoid   Appetite:  Fair  Current Medications: Current  Facility-Administered Medications  Medication Dose Route Frequency Provider Last Rate Last Dose  . acetaminophen (TYLENOL) tablet 650 mg  650 mg Oral Q6H PRN Patrecia Pour, NP   650 mg at 02/17/16 1947  . alum & mag hydroxide-simeth (MAALOX/MYLANTA) 200-200-20 MG/5ML suspension 30 mL  30 mL Oral Q4H PRN Patrecia Pour, NP      . amLODipine (NORVASC) tablet 5 mg  5 mg Oral Daily Laverle Hobby, PA-C   5 mg at 02/19/16 0816  . ARIPiprazole (ABILIFY) tablet 5 mg  5 mg Oral QHS Ursula Alert, MD   5 mg at 02/18/16 2206  . ARIPiprazole (ABILIFY) tablet 5 mg  5 mg Oral Daily Ursula Alert, MD   5 mg at 02/19/16 1136  . aspirin EC tablet 81 mg  81 mg Oral Daily Patrecia Pour, NP   81 mg at 02/19/16 0816  .  benztropine (COGENTIN) tablet 0.5 mg  0.5 mg Oral QHS Ursula Alert, MD   0.5 mg at 02/18/16 2204  . buPROPion (WELLBUTRIN XL) 24 hr tablet 150 mg  150 mg Oral Daily Ursula Alert, MD   150 mg at 02/19/16 0816  . clonazePAM (KLONOPIN) tablet 0.5 mg  0.5 mg Oral TID Ursula Alert, MD   0.5 mg at 02/19/16 1135  . diphenhydrAMINE (BENADRYL) capsule 50 mg  50 mg Oral Q8H PRN Ursula Alert, MD       Or  . diphenhydrAMINE (BENADRYL) injection 50 mg  50 mg Intramuscular Q8H PRN Abbott Jasinski, MD      . haloperidol (HALDOL) tablet 10 mg  10 mg Oral Q8H PRN Ursula Alert, MD       Or  . haloperidol lactate (HALDOL) injection 10 mg  10 mg Intramuscular Q8H PRN Shyla Gayheart, MD      . hydrALAZINE (APRESOLINE) tablet 100 mg  100 mg Oral Q8H Patrecia Pour, NP   100 mg at 02/19/16 0547  . isosorbide mononitrate (IMDUR) 24 hr tablet 120 mg  120 mg Oral Daily Patrecia Pour, NP   120 mg at 02/19/16 0816  . lisinopril (PRINIVIL,ZESTRIL) tablet 40 mg  40 mg Oral Daily Patrecia Pour, NP   40 mg at 02/19/16 0817  . magnesium hydroxide (MILK OF MAGNESIA) suspension 30 mL  30 mL Oral Daily PRN Patrecia Pour, NP      . metoprolol (LOPRESSOR) tablet 100 mg  100 mg Oral BID Patrecia Pour, NP   100 mg at 02/19/16 0817  . traZODone (DESYREL) tablet 150 mg  150 mg Oral QHS Patrecia Pour, NP   150 mg at 02/18/16 2204  . ziprasidone (GEODON) capsule 40 mg  40 mg Oral BID Ursula Alert, MD        Lab Results:  Results for orders placed or performed during the hospital encounter of 02/16/16 (from the past 48 hour(s))  Basic metabolic panel     Status: Abnormal   Collection Time: 02/18/16  6:40 AM  Result Value Ref Range   Sodium 140 135 - 145 mmol/L   Potassium 4.3 3.5 - 5.1 mmol/L   Chloride 105 101 - 111 mmol/L   CO2 27 22 - 32 mmol/L   Glucose, Bld 114 (H) 65 - 99 mg/dL   BUN 19 6 - 20 mg/dL   Creatinine, Ser 1.39 (H) 0.61 - 1.24 mg/dL   Calcium 9.4 8.9 - 10.3 mg/dL   GFR calc non Af Amer 59 (L) >60  mL/min   GFR calc  Af Amer >60 >60 mL/min    Comment: (NOTE) The eGFR has been calculated using the CKD EPI equation. This calculation has not been validated in all clinical situations. eGFR's persistently <60 mL/min signify possible Chronic Kidney Disease.    Anion gap 8 5 - 15    Comment: Performed at [facility]    Blood Alcohol level:  Lab Results  Component Value Date   [facility] <5 02/14/2016    Physical Findings: AIMS: Facial and Oral Movements Muscles of Facial Expression: None, normal Lips and Perioral Area: None, normal Jaw: None, normal Tongue: None, normal,Extremity Movements Upper (arms, wrists, hands, fingers): None, normal Lower (legs, knees, ankles, toes): None, normal, Trunk Movements Neck, shoulders, hips: None, normal, Overall Severity Severity of abnormal movements (highest score from questions above): None, normal Incapacitation due to abnormal movements: None, normal Patient's awareness of abnormal movements (rate only patient's report): No Awareness, Dental Status Current problems with teeth and/or dentures?: No Does patient usually wear dentures?: No  CIWA:    COWS:     Musculoskeletal: Strength & Muscle Tone: within normal limits Gait & Station: normal Patient leans: N/A  Psychiatric Specialty Exam: Physical Exam  Nursing note and vitals reviewed.   Review of Systems  Psychiatric/Behavioral: Positive for depression, suicidal ideas and hallucinations. The patient is nervous/anxious.   All other systems reviewed and are negative.   Blood pressure 173/110, pulse 70, temperature 98.6 F (37 C), temperature source Oral, resp. rate 24, height 5' 9"  (1.753 m).There is no weight on file to calculate BMI.  General Appearance: Guarded  Eye Contact:  Fair  Speech:  Pressured  Volume:  Normal  Mood:  Anxious and Depressed  Affect:  Congruent  Thought Process:  Irrelevant and Descriptions of Associations: Circumstantial  Orientation:   Full (Time, Place, and Person)  Thought Content:  Illogical, Delusions, Hallucinations: some vague halucinations last night - unknown if they are tru hallucinations, Paranoid Ideation and Rumination  Suicidal Thoughts:  pt reports feeling unsafe outside the hospital - does not elaborate  reported to staff he will kill himself   Homicidal Thoughts:  No  Memory:  Immediate;   Fair Recent;   Fair Remote;   Fair  Judgement:  Impaired  Insight:  Shallow  Psychomotor Activity:  Restlessness  Concentration:  Concentration: Poor and Attention Span: Poor  Recall:  [employer] of Knowledge:  Fair  Language:  Fair  Akathisia:  No  Handed:  Right  AIMS (if indicated):     Assets:  Desire for Improvement  ADL's:  Intact  Cognition:  WNL  Sleep:  Number of Hours: 4.25     Treatment Plan Summary:Kareem Cowher is a 45 y.o. AA male, who has a hx of delusional disorder, depression , who is on SSD , is single , lives in a motel in [place], who was brought in by [employer] to [facility] for extreme paranoia and feeling unsafe. Pt continues to be paranoid , psychotic , will continue treatment.  Daily contact with patient to assess and evaluate symptoms and progress in treatment and Medication management   Will continue Geodon 100 mg po bid for psychosis. Will increase Abilify 5 mg po bid for psychosis, will taper down Geodon . Patient with prolonged QT on EKG  - but QTC - is <500 - will monitor closely. Increased Wellbutrin XL to 150 mg po daily for affective sx, with plan to titrate higher. Will continue Klonopin 0.5 mg po tid for anxiety sx. Will  make available PRN medications as per agitation protocol. Will continue Trazodone 150 mg po qhs for sleep. Restarted  home medication as indicated. Per [facility] notes - Pt offered MRI for follow up on AVM - declined.Pt offered MRI today to follow up on AVM - he declined again. Per [facility] notes - Pt also could not complete an MMPI - for diagnostic clarification - could not  complete it. Will continue to monitor vitals ,medication compliance and treatment side effects while patient is here.  Will monitor for medical issues as well as call consult as needed.  Reviewed labs-  TSH - wnl , hba1c- wnl ( done at [facility] - 01/29/16) , cmp shows BUN/CR slightly elevated -  repeat - shows Cr as down trending and BUN normal. CSW will continue working on disposition.  Recreational therapy consult. Patient to participate in therapeutic milieu .           Rewa Weissberg, MD 02/19/2016, 1:13 PM

## 2016-02-20 DIAGNOSIS — F333 Major depressive disorder, recurrent, severe with psychotic symptoms: Principal | ICD-10-CM

## 2016-02-20 MED ORDER — CLONIDINE HCL 0.1 MG PO TABS
0.1000 mg | ORAL_TABLET | Freq: Two times a day (BID) | ORAL | Status: DC
  Filled 2016-02-20 (×2): qty 1

## 2016-02-20 MED ORDER — CLONIDINE HCL 0.1 MG PO TABS
0.1000 mg | ORAL_TABLET | Freq: Once | ORAL | Status: AC
  Administered 2016-02-20: 0.1 mg via ORAL
  Filled 2016-02-20: qty 1

## 2016-02-20 MED ORDER — AMLODIPINE BESYLATE 10 MG PO TABS
10.0000 mg | ORAL_TABLET | Freq: Every day | ORAL | Status: DC
  Administered 2016-02-21 – 2016-02-22 (×2): 10 mg via ORAL
  Filled 2016-02-20 (×4): qty 1

## 2016-02-20 MED ORDER — ZIPRASIDONE HCL 20 MG PO CAPS: 20.0000 mg | ORAL_CAPSULE | Freq: Two times a day (BID) | ORAL | Status: DC

## 2016-02-20 MED ORDER — CLONIDINE HCL 0.1 MG PO TABS: 0.1000 mg | ORAL_TABLET | Freq: Three times a day (TID) | ORAL | Status: DC | PRN

## 2016-02-20 MED ORDER — CLONIDINE HCL 0.1 MG PO TABS: 0.1000 mg | ORAL_TABLET | Freq: Two times a day (BID) | ORAL | Status: AC | PRN

## 2016-02-20 MED ORDER — ARIPIPRAZOLE 10 MG PO TABS
10.0000 mg | ORAL_TABLET | Freq: Every day | ORAL | Status: DC
  Administered 2016-02-20 – 2016-02-21 (×2): 10 mg via ORAL
  Filled 2016-02-20 (×4): qty 1

## 2016-02-20 NOTE — Progress Notes (Signed)
[facility] Post 1:1 Observation Documentation  For the first (8) hours following discontinuation of 1:1 precautions, a progress note entry by nursing staff should be documented at least every 2 hours, reflecting the patient's behavior, condition, mood, and conversation.  Use the progress notes for additional entries.  Time 1:1 discontinued:  11:15AM  Patient's Behavior:  Patient in the dayroom watching TV.  Patient is guarded and suspicious.  Patient's Condition:  Patient is alert, quiet and awake.   Patient's Conversation:  Minimal interaction with staff.  Timothy Jackson 02/20/2016, 3:01 PM

## 2016-02-20 NOTE — [facility] Group Notes (Signed)
[facility]  02/20/2016 8:45 AM  Participation Quality: Alert, Appropriate and Oriented  Mood/Affect: Flat; Lethargic  Depression Rating: 0  Anxiety Rating: 0  Thoughts of Suicide: Pt denies SI/HI  Will you contract for safety? Yes  Current AVH: Pt denies; reports "slow" thoughts  Plan for Discharge/Comments: Pt attended discharge planning group and actively participated in group. CSW discussed suicide prevention education with the group and encouraged them to discuss discharge planning and any relevant barriers. Pt reports feeling sleepy because he did not rest well which is normal for him even outside of the hospital. He expressed that [facility].  Transportation Means: Pt reports access to transportation  Supports: No supports mentioned at this time  Chad CordialLauren Carter, LCSWA 02/20/2016 9:48 AM

## 2016-02-20 NOTE — Progress Notes (Signed)
Patient is being monitored on close observation after being reduced from 1:1.  Patient has had no self injurious behaviors during this time.  Patient denies SI and HI but remains paranoid and suspicious.  Patient is able to contract for safety.

## 2016-02-20 NOTE — Progress Notes (Signed)
1:1 progress note:  Pt has been sitting up in a chair since about 0300 because of his labored breathing.  He has dozed off and on.  The sitter reports that when he appears to be asleep, he has periods of apnea.  Pt has a hospital bed, but even with the head elevated, he cannot breathe easily.  Pt continues to endorse SI.  Continue 1:1 for safety.  Pt is safe at this time with sitter at his bedside.

## 2016-02-20 NOTE — Progress Notes (Signed)
1:1 NOTE : Patient maintained on 1:1 supervision for safety.  Patient verbally contract for safety.  Medication given as prescribed.  Rates depression at 7, hopelessness at 7, and anxiety at 8.  Describes energy level as low and concentration as poor.  States goal for today is to "tay alive."  Routine safety checks continues.  Patient with staff in his room.  Offered no complaints.

## 2016-02-20 NOTE — Progress Notes (Signed)
Recreation Therapy Notes  05.26.2017 approximately 2:30pm. LRT returned to investigate patient progress on song witting. Sheet music was in the same place LRT left it. As soon as LRT entered patient room, patient reported he has been attending groups and going outside. Patient reports he does not enjoy going to the cafeteria to eat. Patient identified that he does not like standing in line and secondary to that he feels like people are watching and judging him. At this time patient active interest investigated. Patient reports he enjoys walking. LRT will return and attempt to get patient to walk with her for at least 20 minutes at a time. Patient informed of LRT intentions, patient agreed to participate.   Marykay Lexenise L Russell Quinney, LRT/CTRS   Jearl KlinefelterBlanchfield, Takenya Travaglini L 02/20/2016 3:53 PM

## 2016-02-20 NOTE — Progress Notes (Signed)
[facility]  02/20/2016 12:14 PM Timothy NeedsOtis Jackson  MRN:  [id number] Subjective:  Pt states " I am not sleeping at night."    Objective:Timothy Jackson is a 45 y.o. AA male, who has a hx of delusional disorder, depression , who is on SSD , is single , lives in a motel in [place], [facility] and feeling unsafe.  Patient seen and chart reviewed.Discussed patient with treatment team.  Pt today continues to be irrelevant , delusional , and continues to ruminate. Pt also reports sleep issues at night. However , as observed on the unit pt sleeps during daytime. Pt was asked to limit his sleep during the day and to interact in milieu. As observed by staff - pt continues to be very paranoid and anxious - does not interact with peers and stays in his room all day , either sleeping or in bed. Pt continues to need encouragement and support.        Principal Problem: MDD (major depressive disorder), recurrent, severe, with psychosis ([facility]) Diagnosis:   Patient Active Problem List   Diagnosis Date Noted  . MDD (major depressive disorder), recurrent, severe, with psychosis ([facility]) [F33.3] 02/17/2016  . Cannabis use disorder, severe, dependence ([facility]) [F12.20] 02/17/2016  . Essential hypertension [I10] 02/17/2016  . History of arteriovenous malformation (AVM) [Z86.79] 02/17/2016  . Morbid obesity (HCC) [E66.01] 02/17/2016   Total Time spent with patient: 25 minutes  Past Psychiatric History: Please see H&P.   Past Medical History:  Past Medical History  Diagnosis Date  . Paranoid ([facility])   . AVM (arteriovenous malformation)   . Hypertension    History reviewed. No pertinent past surgical history. Family History:  Family History  Problem Relation Age of Onset  . Cancer Mother   . Bipolar disorder Maternal Aunt   . Bipolar disorder Maternal Aunt    Family Psychiatric  History: Please see H&P.  Social History:  History  Alcohol Use  . Yes      History  Drug Use  . Yes  . Special: Marijuana    Social History   Social History  . Marital Status: Single    Spouse Name: N/A  . Number of Children: N/A  . Years of Education: N/A   Social History Main Topics  . Smoking status: Current Some Day Smoker    Types: Cigarettes  . Smokeless tobacco: None  . Alcohol Use: Yes  . Drug Use: Yes    Special: Marijuana  . Sexual Activity: Not Asked   Other Topics Concern  . None   Social History Narrative   Additional Social History:    Pain Medications: See MAR Prescriptions: See MAR Over the Counter: See MAR History of alcohol / drug use?: Yes Longest period of sobriety (when/how long): 5 years Negative Consequences of Use: Financial Withdrawal Symptoms: Other (Comment) (Denies history of withdrawal) Name of Substance 1: Cannibis 1 - Age of First Use: 16 1 - Amount (size/oz): 2 grams   1 - Frequency: once a week 1 - Duration: on and off for years 1 - Last Use / Amount: 02/13/16                  Sleep: sleep during the day - stays up at night since he is paranoid   Appetite:  Fair  Current Medications: Current Facility-Administered Medications  Medication Dose Route Frequency Provider Last Rate Last Dose  . acetaminophen (TYLENOL) tablet 650 mg  650 mg Oral Q6H PRN Charm Rings, NP   650 mg at 02/17/16 1947  . alum & mag hydroxide-simeth (MAALOX/MYLANTA) 200-200-20 MG/5ML suspension 30 mL  30 mL Oral Q4H PRN Charm Rings, NP      . amLODipine (NORVASC) tablet 5 mg  5 mg Oral Daily Kerry Hough, PA-C   5 mg at 02/20/16 1610  . ARIPiprazole (ABILIFY) tablet 10 mg  10 mg Oral QHS Timothy Paulus, MD      . ARIPiprazole (ABILIFY) tablet 5 mg  5 mg Oral Daily Jomarie Longs, MD   5 mg at 02/20/16 0802  . aspirin EC tablet 81 mg  81 mg Oral Daily Charm Rings, NP   81 mg at 02/20/16 9604  . benztropine (COGENTIN) tablet 0.5 mg  0.5 mg Oral QHS Jomarie Longs, MD   0.5 mg at 02/19/16 2129  . buPROPion  (WELLBUTRIN XL) 24 hr tablet 150 mg  150 mg Oral Daily Jomarie Longs, MD   150 mg at 02/20/16 0803  . clonazePAM (KLONOPIN) tablet 0.5 mg  0.5 mg Oral TID Jomarie Longs, MD   0.5 mg at 02/20/16 1113  . diphenhydrAMINE (BENADRYL) capsule 50 mg  50 mg Oral Q8H PRN Jomarie Longs, MD       Or  . diphenhydrAMINE (BENADRYL) injection 50 mg  50 mg Intramuscular Q8H PRN Kylyn Mcdade, MD      . haloperidol (HALDOL) tablet 10 mg  10 mg Oral Q8H PRN Jomarie Longs, MD       Or  . haloperidol lactate (HALDOL) injection 10 mg  10 mg Intramuscular Q8H PRN Maryclare Nydam, MD      . hydrALAZINE (APRESOLINE) tablet 100 mg  100 mg Oral Q8H Charm Rings, NP   100 mg at 02/20/16 5409  . isosorbide mononitrate (IMDUR) 24 hr tablet 120 mg  120 mg Oral Daily Charm Rings, NP   120 mg at 02/20/16 0802  . lisinopril (PRINIVIL,ZESTRIL) tablet 40 mg  40 mg Oral Daily Charm Rings, NP   40 mg at 02/20/16 8119  . magnesium hydroxide (MILK OF MAGNESIA) suspension 30 mL  30 mL Oral Daily PRN Charm Rings, NP      . metoprolol (LOPRESSOR) tablet 100 mg  100 mg Oral BID Charm Rings, NP   100 mg at 02/20/16 0803  . traZODone (DESYREL) tablet 150 mg  150 mg Oral QHS Charm Rings, NP   150 mg at 02/19/16 2128    Lab Results:  No results found for this or any previous visit (from the past 48 hour(s)).  Blood Alcohol level:  Lab Results  Component Value Date   ETH <5 02/14/2016    Physical Findings: AIMS: Facial and Oral Movements Muscles of Facial Expression: None, normal Lips and Perioral Area: None, normal Jaw: None, normal Tongue: None, normal,Extremity Movements Upper (arms, wrists, hands, fingers): None, normal Lower (legs, knees, ankles, toes): None, normal, Trunk Movements Neck, shoulders, hips: None, normal, Overall Severity Severity of abnormal movements (highest score from questions above): None, normal Incapacitation due to abnormal movements: None, normal Patient's awareness of abnormal  movements (rate only patient's report): No Awareness, Dental Status Current problems with teeth and/or dentures?: No Does patient usually wear dentures?: No  CIWA:    COWS:     Musculoskeletal: Strength & Muscle Tone: within normal limits Gait & Station: normal Patient leans: N/A  Psychiatric Specialty Exam: Physical Exam  Nursing note and vitals reviewed.  Review of Systems  Psychiatric/Behavioral: Positive for depression, suicidal ideas and hallucinations. The patient is nervous/anxious.   All other systems reviewed and are negative.   Blood pressure 166/100, pulse 74, temperature 98.6 F (37 C), temperature source Oral, resp. rate 24, height  (1.753 m).There is no weight on file to calculate BMI.  General Appearance: Guarded  Eye Contact:  Fair  Speech:  Normal Rate  Volume:  Normal  Mood:  Anxious and Depressed  Affect:  Congruent  Thought Process:  Irrelevant and Descriptions of Associations: Circumstantial  Orientation:  Full (Time, Place, and Person)  Thought Content:  Illogical, Delusions, Hallucinations: some vague halucinations last night - unknown if they are tru hallucinations, Paranoid Ideation and Rumination  Suicidal Thoughts:  pt reports feeling unsafe outside the hospital - does not elaborate  pt threatens to kill himself if he were discharged, pt with suicidal gestures this admission while in ED- He cut BL wrist   Homicidal Thoughts:  No  Memory:  Immediate;   Fair Recent;   Fair Remote;   Fair  Judgement:  Impaired  Insight:  Shallow  Psychomotor Activity:  Restlessness  Concentration:  Concentration: Poor and Attention Span: Poor  Recall:  [employer] of Knowledge:  Fair  Language:  Fair  Akathisia:  No  Handed:  Right  AIMS (if indicated):     Assets:  Desire for Improvement  ADL's:  Intact  Cognition:  WNL  Sleep:  Number of Hours: 6 (Pt has periods of apnea during sleep)     Treatment Plan Summary:Kache Cominsky is a 45 y.o. AA male, who  has a hx of delusional disorder, depression , who is on SSD , is single , lives in a motel in [place], who was brought in by [facility] to [facility] for extreme paranoia and feeling unsafe. Pt continues to be paranoid , psychotic , will continue treatment.  Daily contact with patient to assess and evaluate symptoms and progress in treatment and Medication management   Will taper down and DC Geodon for lack of efficacy. Will increase Abilify 5 mg po daily and 10 mg po qhs for psychosis.  Patient with prolonged QT on EKG  - but QTC - is <500 - will monitor closely. Increased Wellbutrin XL to 150 mg po daily for affective sx, with plan to titrate higher. Will continue Klonopin 0.5 mg po tid for anxiety sx. Will make available PRN medications as per agitation protocol. Will continue Trazodone 150 mg po qhs for sleep. Restarted  home medication as indicated. Per [facility] notes - Pt offered MRI for follow up on AVM - declined.Pt offered MRI today to follow up on AVM - he declined again. Per [facility] notes - Pt also could not complete an MMPI - for diagnostic clarification - could not complete it. Will continue to monitor vitals ,medication compliance and treatment side effects while patient is here.  Will monitor for medical issues as well as call consult as needed. Pt with elevated BP inspite of being on multiple antihypertensive medications . Will call hospitalist consult. Reviewed labs-  TSH - wnl , hba1c- wnl ( done at [facility] - 01/29/16) , cmp shows BUN/CR slightly elevated -  repeat - shows Cr as down trending and BUN normal. CSW will continue working on disposition.  Recreational therapy consult. Patient to participate in therapeutic milieu .           Demontrez Rindfleisch, MD 02/20/2016, 12:14 PM

## 2016-02-20 NOTE — Progress Notes (Addendum)
  [facility] about patient's elevated BP. Per recommendations - Patient's Norvasc has been increased to 10 mg po daily today. Since Norvasc needs time to work - will also give him clonidine 0.1 mg two to three times /day  for the next two days if needed. However, clonidine should not be continued more than two days due to the risk of rebound HTN.  Please follow up on his BP tomorrow and call Hospitalist for further recommendations .  Jomarie LongsSaramma Sheralee Qazi ,MD Attending Psychiatrist  [facility]

## 2016-02-20 NOTE — Consult Note (Addendum)
Chart reviewed, case discussed with psychiatrist Dr. Jomarie LongsSaramma Eappen by phone, patient was not seen and not  physically examined. Was admitted 02/17/2016.   1)Uncontrolled HTN- increase amlodipine to 10 mg daily, may use clonidine 0.1 mg every 12 hours prn SBP > 180 mmhg, consider stopping metoprolol and using oral labetalol 200 mg 3 times a day instead if BP remains uncontrolled over the next 48-72 hours. Continue lisinopril 40 mg daily continue hydralazine 100 mg 3 times a day and continue isosorbide 120 mg daily. Patient is morbidly obese and weighs over 350 pounds according to the psychiatrist. Creatinine down to 1.3 from 1.5, CBC from 02/14/2016 unremarkable  2) schizophrenia/paranoid psychosis-psychiatrist is managing this.   Chart reviewed, case discussed with psychiatrist Dr. Jomarie LongsSaramma Eappen by phone, patient was not seen and not physically examined. Was admitted 02/17/2016.    Please call [facility] hospitalists service prn if BP remains elevated and further input is required. Thank you for allowing us to participate in the care of your patient

## 2016-02-20 NOTE — Progress Notes (Signed)
Patient ID: Timothy Jackson Coberly, male   DOB: July 14, 1971, 45 y.o.   MRN: [id number] PER STATE REGULATIONS 482.30  THIS CHART WAS REVIEWED FOR MEDICAL NECESSITY WITH RESPECT TO THE PATIENT'S ADMISSION/ DURATION OF STAY.  NEXT REVIEW DATE: 02/24/2016  Willa RoughJENNIFER JONES Valisa Karpel, RN, BSN CASE MANAGER

## 2016-02-20 NOTE — [facility] Group Notes (Addendum)
[facility] LCSW Group Therapy 02/20/2016  1:15 pm  Type of Therapy: Group Therapy Participation Level: Minimal  Participation Quality: Limited  Affect: Lethargic, depressed, flat  Cognitive: Alert and Oriented  Insight: Developing/Improving and Engaged  Engagement in Therapy: Developing/Improving and Engaged  Modes of Intervention: Clarification, Confrontation, Discussion, Education, Exploration,  Limit-setting, Orientation, Problem-solving, Rapport Building, Dance movement psychotherapisteality Testing, Socialization and Support  Summary of Progress/Problems: Pt participated minimally in conversation related to hope despite CSW encouragement.  Samuella BruinKristin Sunny Aguon, LCSW Clinical Social Worker [facility] [phone]

## 2016-02-20 NOTE — Progress Notes (Signed)
Pt is sitting up on the side of his bed.  His breathing is loud and labored.  When staff mention sleep apnea and use of a C-PAP, he states that he is sleeping, even when he is sitting up.  He continues on 1:1 for safety d/t suicidal ideation.  Sitter at bedside.  Will inform next shift for need of evaluation for sleep apnea.  Pt safe at this time.

## 2016-02-20 NOTE — Progress Notes (Signed)
[facility] Post 1:1 Observation Documentation  For the first (8) hours following discontinuation of 1:1 precautions, a progress note entry by nursing staff should be documented at least every 2 hours, reflecting the patient's behavior, condition, mood, and conversation.  Use the progress notes for additional entries.  Time 1:1 discontinued:  1115AM  Patient's Behavior:  Patient in his room sleeping. Patient's Condition:   Patient is sleeping  Patient's Conversation:  No interaction with staff  Mickie BailElizabeth O Iwenekha 02/20/2016, 7:03 PM

## 2016-02-20 NOTE — Progress Notes (Signed)
[facility] Group Notes:  (Nursing/MHT/Case Management/Adjunct)  Date:  02/20/2016  Time:  8:45 PM  Type of Therapy:  Psychoeducational Skills  Participation Level:  Minimal  Participation Quality:  Drowsy  Affect:  Depressed and Flat  Cognitive:  Lacking  Insight:  Lacking  Engagement in Group:  Limited  Modes of Intervention:  Education  Summary of Progress/Problems: The patient had little to share in group except to say that he had a good day because he went outside for fresh air.  Hazle CocaGOODMAN, Desteny Freeman S 02/20/2016, 8:45 PM

## 2016-02-20 NOTE — Progress Notes (Signed)
[facility] Post 1:1 Observation Documentation  For the first (8) hours following discontinuation of 1:1 precautions, a progress note entry by nursing staff should be documented at least every 2 hours, reflecting the patient's behavior, condition, mood, and conversation.  Use the progress notes for additional entries.  Time 1:1 discontinued:  1115AM  Patient's Behavior: Patient is in his room sitting on the bed and talking to himself.   Patient's Condition: Patient is alert and oriented x 4.   Patient's Conversation:  Minimal interaction with staff.  Mickie Baillizabeth O Iwenekha 02/20/2016, 5:31 PM

## 2016-02-21 MED ORDER — BUPROPION HCL ER (XL) 300 MG PO TB24
300.0000 mg | ORAL_TABLET | Freq: Every day | ORAL | Status: DC
  Administered 2016-02-22: 300 mg via ORAL
  Filled 2016-02-21 (×3): qty 1

## 2016-02-21 MED ORDER — TRAZODONE HCL 100 MG PO TABS
200.0000 mg | ORAL_TABLET | Freq: Every day | ORAL | Status: DC
  Administered 2016-02-21: 200 mg via ORAL
  Filled 2016-02-21 (×4): qty 2

## 2016-02-21 NOTE — Progress Notes (Signed)
D: Pt is alert and oriented x4. Pt endorses severe anxiety of 10 on a 0-10 anxiety scale. He states, "I can feel my heart raising." Pt also endorses mild bilateral leg pain; moderate fall risk. Pt however, denied depression, pain, and SI/HI. Pt at the time of assessment also denies AVH; he states, "Everything has been quiet since morning." Pt is flat and looks very worried. Pt remained calm and cooperative through the shift assessment. A: Medications offered as prescribed.  Support, encouragement, and safe environment provided.  15-minute safety checks continue. R: Pt was med compliant.  Pt attended group. Safety checks continue.

## 2016-02-21 NOTE — Progress Notes (Signed)
  D: When asked about his stay pt stated, "not so good. When I think it's good, something shuts it down". However, pt didn't divulge any explanations. Pt did however, read his poem to the writer, along with a song.  Pt has no questions or concerns.    A:  Support and encouragement was offered. 15 min checks continued for safety.  R: Pt remains safe.

## 2016-02-21 NOTE — Progress Notes (Signed)
[name]  02/21/2016 5:12 PM Timothy NeedsOtis Jackson  MRN:  [id number] Subjective:  Patient states " I am still not sleeping at night states 2 hours of sleep on and off."     Objective: Timothy Jackson is awake, alert and oriented X3. found attending group session.  Denies suicidal or homicidal ideation. Denies auditory or visual hallucination and does not appear to be responding to internal stimuli. Patient reports he is unable to sleep throughout the night. Patient reports he is medication compliant without mediation side effects. Report learning new coping skills on"how to handle anxiety"  States his depression 2/10.Reports good appetite. Support, encouragement and reassurance was provided.   HTN:-patient is asymptomatic at this time. Noted BP 158/88 with downward trend. [facility] will reassess in 2 to 3 days pending BP and patient symptoms. [phone] per MD.    Principal Problem: MDD (major depressive disorder), recurrent, severe, with psychosis (HCC) Diagnosis:   Patient Active Problem List   Diagnosis Date Noted  . MDD (major depressive disorder), recurrent, severe, with psychosis (HCC) [F33.3] 02/17/2016  . Cannabis use disorder, severe, dependence (HCC) [F12.20] 02/17/2016  . Essential hypertension [I10] 02/17/2016  . History of arteriovenous malformation (AVM) [Z86.79] 02/17/2016  . Morbid obesity (HCC) [E66.01] 02/17/2016   Total Time spent with patient: 25 minutes  Past Psychiatric History: Please see H&P.   Past Medical History:  Past Medical History  Diagnosis Date  . Paranoid (HCC)   . AVM (arteriovenous malformation)   . Hypertension    History reviewed. No pertinent past surgical history. Family History:  Family History  Problem Relation Age of Onset  . Cancer Mother   . Bipolar disorder Maternal Aunt   . Bipolar disorder Maternal Aunt    Family Psychiatric  History: Please see H&P.  Social History:  History  Alcohol Use  . Yes     History   Drug Use  . Yes  . Special: Marijuana    Social History   Social History  . Marital Status: Single    Spouse Name: N/A  . Number of Children: N/A  . Years of Education: N/A   Social History Main Topics  . Smoking status: Current Some Day Smoker    Types: Cigarettes  . Smokeless tobacco: None  . Alcohol Use: Yes  . Drug Use: Yes    Special: Marijuana  . Sexual Activity: Not Asked   Other Topics Concern  . None   Social History Narrative   Additional Social History:    Pain Medications: See MAR Prescriptions: See MAR Over the Counter: See MAR History of alcohol / drug use?: Yes Longest period of sobriety (when/how long): 5 years Negative Consequences of Use: Financial Withdrawal Symptoms: Other (Comment) (Denies history of withdrawal) Name of Substance 1: Cannibis 1 - Age of First Use: 16 1 - Amount (size/oz): 2 grams   1 - Frequency: once a week 1 - Duration: on and off for years 1 - Last Use / Amount: 02/13/16                  Sleep: sleep during the day - stays up at night since he is paranoid   Appetite:  Fair  Current Medications: Current Facility-Administered Medications  Medication Dose Route Frequency Provider Last Rate Last Dose  . acetaminophen (TYLENOL) tablet 650 mg  650 mg Oral Q6H PRN Charm RingsJamison Y Lord, NP   650 mg at 02/17/16 1947  . alum & mag hydroxide-simeth (MAALOX/MYLANTA) 200-200-20 MG/5ML  suspension 30 mL  30 mL Oral Q4H PRN Charm Rings, NP      . amLODipine (NORVASC) tablet 10 mg  10 mg Oral Daily Shon Hale, MD   10 mg at 02/21/16 0800  . ARIPiprazole (ABILIFY) tablet 10 mg  10 mg Oral QHS Jomarie Longs, MD   10 mg at 02/20/16 2109  . ARIPiprazole (ABILIFY) tablet 5 mg  5 mg Oral Daily Jomarie Longs, MD   5 mg at 02/21/16 1308  . aspirin EC tablet 81 mg  81 mg Oral Daily Charm Rings, NP   81 mg at 02/21/16 6578  . benztropine (COGENTIN) tablet 0.5 mg  0.5 mg Oral QHS Jomarie Longs, MD   0.5 mg at 02/20/16 2109  .  buPROPion (WELLBUTRIN XL) 24 hr tablet 150 mg  150 mg Oral Daily Jomarie Longs, MD   150 mg at 02/21/16 4696  . clonazePAM (KLONOPIN) tablet 0.5 mg  0.5 mg Oral TID Jomarie Longs, MD   0.5 mg at 02/21/16 1216  . cloNIDine (CATAPRES) tablet 0.1 mg  0.1 mg Oral Q12H PRN Jomarie Longs, MD      . diphenhydrAMINE (BENADRYL) capsule 50 mg  50 mg Oral Q8H PRN Jomarie Longs, MD       Or  . diphenhydrAMINE (BENADRYL) injection 50 mg  50 mg Intramuscular Q8H PRN Saramma Eappen, MD      . haloperidol (HALDOL) tablet 10 mg  10 mg Oral Q8H PRN Jomarie Longs, MD       Or  . haloperidol lactate (HALDOL) injection 10 mg  10 mg Intramuscular Q8H PRN Saramma Eappen, MD      . hydrALAZINE (APRESOLINE) tablet 100 mg  100 mg Oral Q8H Charm Rings, NP   100 mg at 02/21/16 1324  . isosorbide mononitrate (IMDUR) 24 hr tablet 120 mg  120 mg Oral Daily Charm Rings, NP   120 mg at 02/21/16 2952  . lisinopril (PRINIVIL,ZESTRIL) tablet 40 mg  40 mg Oral Daily Charm Rings, NP   40 mg at 02/21/16 8413  . magnesium hydroxide (MILK OF MAGNESIA) suspension 30 mL  30 mL Oral Daily PRN Charm Rings, NP      . metoprolol (LOPRESSOR) tablet 100 mg  100 mg Oral BID Charm Rings, NP   100 mg at 02/21/16 2440  . traZODone (DESYREL) tablet 150 mg  150 mg Oral QHS Charm Rings, NP   150 mg at 02/20/16 2109    Lab Results:  No results found for this or any previous visit (from the past 48 hour(s)).  Blood Alcohol level:  Lab Results  Component Value Date   ETH <5 02/14/2016    Physical Findings: AIMS: Facial and Oral Movements Muscles of Facial Expression: None, normal Lips and Perioral Area: None, normal Jaw: None, normal Tongue: None, normal,Extremity Movements Upper (arms, wrists, hands, fingers): None, normal Lower (legs, knees, ankles, toes): None, normal, Trunk Movements Neck, shoulders, hips: None, normal, Overall Severity Severity of abnormal movements (highest score from questions above): None,  normal Incapacitation due to abnormal movements: None, normal Patient's awareness of abnormal movements (rate only patient's report): No Awareness, Dental Status Current problems with teeth and/or dentures?: No Does patient usually wear dentures?: No  CIWA:    COWS:     Musculoskeletal: Strength & Muscle Tone: within normal limits Gait & Station: normal Patient leans: N/A  Psychiatric Specialty Exam: Physical Exam  Nursing note and vitals reviewed. Constitutional: He is oriented to  person, place, and time.  Neurological: He is alert and oriented to person, place, and time.  Skin: Skin is warm.  Psychiatric: He has a normal mood and affect.    Review of Systems  Psychiatric/Behavioral: Positive for depression, suicidal ideas and hallucinations. The patient is nervous/anxious.   All other systems reviewed and are negative.   Blood pressure 179/100, pulse 69, temperature 98.6 F (37 C), temperature source Oral, resp. rate 23, height 5\' 9"  (1.753 m).There is no weight on file to calculate BMI.  General Appearance: Casual  Eye Contact:  Fair  Speech:  Normal Rate  Volume:  Normal  Mood:  Anxious and Depressed  Affect:  Congruent  Thought Process:  Irrelevant and Descriptions of Associations: Circumstantial  Orientation:  Full (Time, Place, and Person)  Thought Content:  Illogical, Delusions, Hallucinations: some vague halucinations last night - unknown if they are tru hallucinations, Paranoid Ideation and Rumination  Suicidal Thoughts:  pt reports feeling unsafe outside the hospital - does not elaborate  pt threatens to kill himself if he were discharged, pt with suicidal gestures this admission while in ED- He cut BL wrist   Homicidal Thoughts:  No  Memory:  Immediate;   Fair Recent;   Fair Remote;   Fair  Judgement:  Impaired  Insight:  Shallow  Psychomotor Activity:  Restlessness  Concentration:  Concentration: Poor and Attention Span: Poor  Recall:  [employer] of  Knowledge:  Fair  Language:  Fair  Akathisia:  No  Handed:  Right  AIMS (if indicated):     Assets:  Desire for Improvement  ADL's:  Intact  Cognition:  WNL  Sleep:  Number of Hours: 6.75    I agree with current treatment plan on 02/21/2016, Patient seen face-to-face for psychiatric evaluation follow-up, chart reviewed. Reviewed the information documented and agree with the treatment plan.  Treatment Plan Summary Daily contact with patient to assess and evaluate symptoms and progress in treatment and Medication management Will taper down and DC Geodon for lack of efficacy. Will increase Abilify 5 mg po daily and 10 mg po qhs for psychosis.  Patient with prolonged QT on EKG  - but QTC - is <500 - will monitor closely. Increased Wellbutrin XL to 150 mg to 300 mg po daily for affective sx, with plan to titrate higher. Will continue Klonopin 0.5 mg po tid for anxiety sx. Will make available PRN medications as per agitation protocol. Increased Trazodone 200 mg po qhs for sleep. Restarted  home medication as indicated. Per [facility] notes - Pt offered MRI for follow up on AVM - declined.Pt offered MRI today to follow up on AVM - he declined again. Per [facility] notes - Pt also could not complete an MMPI - for diagnostic clarification - could not complete it.   Will continue to monitor vitals ,medication compliance and treatment side effects while patient is here.  Will monitor for medical issues as well as call consult as needed. Pt with elevated BP inspite of being on multiple antihypertensive medications . Will call hospitalist consult. Reviewed labs-  TSH - wnl , hba1c- wnl ( done at [facility] - 01/29/16) , cmp shows BUN/CR slightly elevated -  repeat - shows Cr as down trending and BUN normal. CSW will continue working on disposition.  Recreational therapy consult. Patient to participate in therapeutic milieu .        Oneta Rack, NP 02/21/2016, 5:12 PM Agree with NP Progress Note as above

## 2016-02-21 NOTE — [facility] Group Notes (Signed)
[facility] Group Notes:  (Nursing/MHT/Case Management/Adjunct)  Date:  02/21/2016  Time:  12:51 PM  Type of Therapy:  Nurse Education  Participation Level:  Active  Participation Quality:  Appropriate and Attentive  Affect:  Appropriate  Cognitive:  Alert and Appropriate  Insight:  Appropriate and Good  Engagement in Group:  Engaged  Modes of Intervention:  Discussion and Education  Summary of Progress/Problems: Topic was on healthy coping skills. Discussed the importance of learning new positive coping skills and replacing the negative skills. Group encouraged to maintain these skills will lead to a healthy lifestyle. Patient was attentive and receptive.  Mickie Baillizabeth O Iwenekha 02/21/2016, 12:51 PM

## 2016-02-21 NOTE — [facility] Group Notes (Signed)
[facility] Group Notes:  (Clinical Social Work)  02/21/2016  11:15-12:00PM  Summary of Progress/Problems:   Today's process group involved patients discussing their feelings related to being hospitalized. The patient expressed his/her primary feeling about being hospitalized is that he does not like it and "something is going on."  Because many group members were highly anxious, most of group time was spent discussing suggestions for anxiety reduction, then trying each one.  This was very interactive.  He participated very actively in all the anxiety-reduction exercises.  Type of Therapy:  Group Therapy - Process  Participation Level:  Active  Participation Quality:  Attentive and Sharing  Affect:  Anxious, Flat and Irritable  Cognitive:  Paranoid  Insight:  Improving  Engagement in Therapy:  Improving  Modes of Intervention:  Exploration, Discussion  Ambrose MantleMareida Grossman-Orr, LCSW 02/21/2016, 1:09 PM

## 2016-02-21 NOTE — Progress Notes (Signed)
[facility] Group Notes:  (Nursing/MHT/Case Management/Adjunct)  Date:  02/21/2016  Time:  8:52 PM  Type of Therapy:  Psychoeducational Skills  Participation Level:  Active  Participation Quality:  Appropriate  Affect:  Appropriate  Cognitive:  Appropriate  Insight:  Improving  Engagement in Group:  Improving  Modes of Intervention:  Education  Summary of Progress/Problems: The patient states that he had a good day since he was able to watch television and because he was grateful to be alive. As for the theme of the day, his relapse prevention will be to [facility].   [place]ashburnGOODMAN, MichiganBENJAMIN S 02/21/2016, 8:52 PM

## 2016-02-22 ENCOUNTER — Inpatient Hospital Stay (HOSPITAL_COMMUNITY)
Admission: EM | Admit: 2016-02-22 | Discharge: 2016-02-26 | DRG: 291 | Disposition: A | Discharge status: Other Acute Inpatient Hospital | Payer: Medicare Other | Attending: Internal Medicine | Admitting: Internal Medicine

## 2016-02-22 ENCOUNTER — Encounter (HOSPITAL_COMMUNITY): Payer: Self-pay

## 2016-02-22 ENCOUNTER — Emergency Department (HOSPITAL_COMMUNITY)
Admission: EM | Admit: 2016-02-22 | Discharge: 2016-02-22 | Disposition: A | Discharge status: Other Acute Inpatient Hospital | Payer: Medicare Other

## 2016-02-22 ENCOUNTER — Inpatient Hospital Stay (HOSPITAL_COMMUNITY): Payer: Medicare Other

## 2016-02-22 DIAGNOSIS — F1220 Cannabis dependence, uncomplicated: Secondary | ICD-10-CM | POA: Diagnosis present

## 2016-02-22 DIAGNOSIS — Z79899 Other long term (current) drug therapy: Secondary | ICD-10-CM

## 2016-02-22 DIAGNOSIS — Z806 Family history of leukemia: Secondary | ICD-10-CM

## 2016-02-22 DIAGNOSIS — I509 Heart failure, unspecified: Secondary | ICD-10-CM | POA: Diagnosis not present

## 2016-02-22 DIAGNOSIS — Z6841 Body Mass Index (BMI) 40.0 and over, adult: Secondary | ICD-10-CM

## 2016-02-22 DIAGNOSIS — T1491XA Suicide attempt, initial encounter: Secondary | ICD-10-CM | POA: Insufficient documentation

## 2016-02-22 DIAGNOSIS — Z8249 Family history of ischemic heart disease and other diseases of the circulatory system: Secondary | ICD-10-CM | POA: Diagnosis not present

## 2016-02-22 DIAGNOSIS — I5033 Acute on chronic diastolic (congestive) heart failure: Secondary | ICD-10-CM | POA: Diagnosis present

## 2016-02-22 DIAGNOSIS — I420 Dilated cardiomyopathy: Secondary | ICD-10-CM | POA: Diagnosis present

## 2016-02-22 DIAGNOSIS — I10 Essential (primary) hypertension: Secondary | ICD-10-CM

## 2016-02-22 DIAGNOSIS — F323 Major depressive disorder, single episode, severe with psychotic features: Secondary | ICD-10-CM | POA: Diagnosis present

## 2016-02-22 DIAGNOSIS — N529 Male erectile dysfunction, unspecified: Secondary | ICD-10-CM | POA: Diagnosis present

## 2016-02-22 DIAGNOSIS — N183 Chronic kidney disease, stage 3 (moderate): Secondary | ICD-10-CM | POA: Diagnosis present

## 2016-02-22 DIAGNOSIS — Q282 Arteriovenous malformation of cerebral vessels: Secondary | ICD-10-CM

## 2016-02-22 DIAGNOSIS — F411 Generalized anxiety disorder: Secondary | ICD-10-CM | POA: Diagnosis present

## 2016-02-22 DIAGNOSIS — R609 Edema, unspecified: Secondary | ICD-10-CM

## 2016-02-22 DIAGNOSIS — I160 Hypertensive urgency: Secondary | ICD-10-CM | POA: Diagnosis present

## 2016-02-22 DIAGNOSIS — N179 Acute kidney failure, unspecified: Secondary | ICD-10-CM

## 2016-02-22 DIAGNOSIS — Z7982 Long term (current) use of aspirin: Secondary | ICD-10-CM

## 2016-02-22 DIAGNOSIS — H9191 Unspecified hearing loss, right ear: Secondary | ICD-10-CM | POA: Diagnosis present

## 2016-02-22 DIAGNOSIS — R0602 Shortness of breath: Secondary | ICD-10-CM | POA: Diagnosis present

## 2016-02-22 DIAGNOSIS — Z9114 Patient's other noncompliance with medication regimen: Secondary | ICD-10-CM

## 2016-02-22 DIAGNOSIS — R45851 Suicidal ideations: Secondary | ICD-10-CM | POA: Diagnosis present

## 2016-02-22 DIAGNOSIS — Z833 Family history of diabetes mellitus: Secondary | ICD-10-CM

## 2016-02-22 DIAGNOSIS — F200 Paranoid schizophrenia: Secondary | ICD-10-CM | POA: Diagnosis present

## 2016-02-22 DIAGNOSIS — F17210 Nicotine dependence, cigarettes, uncomplicated: Secondary | ICD-10-CM | POA: Diagnosis present

## 2016-02-22 DIAGNOSIS — I130 Hypertensive heart and chronic kidney disease with heart failure and stage 1 through stage 4 chronic kidney disease, or unspecified chronic kidney disease: Secondary | ICD-10-CM | POA: Diagnosis not present

## 2016-02-22 DIAGNOSIS — F333 Major depressive disorder, recurrent, severe with psychotic symptoms: Secondary | ICD-10-CM | POA: Diagnosis present

## 2016-02-22 DIAGNOSIS — R600 Localized edema: Secondary | ICD-10-CM

## 2016-02-22 LAB — CBC WITH DIFFERENTIAL/PLATELET
Basophils Absolute: 0 10*3/uL (ref 0.0–0.1)
Basophils Relative: 0 %
Eosinophils Absolute: 0.1 10*3/uL (ref 0.0–0.7)
Eosinophils Relative: 2 %
HEMATOCRIT: 36.7 % — AB (ref 39.0–52.0)
HEMOGLOBIN: 12 g/dL — AB (ref 13.0–17.0)
LYMPHS PCT: 23 %
Lymphs Abs: 1.2 10*3/uL (ref 0.7–4.0)
MCH: 26.7 pg (ref 26.0–34.0)
MCHC: 32.7 g/dL (ref 30.0–36.0)
MCV: 81.7 fL (ref 78.0–100.0)
Monocytes Absolute: 0.5 10*3/uL (ref 0.1–1.0)
Monocytes Relative: 9 %
NEUTROS ABS: 3.5 10*3/uL (ref 1.7–7.7)
NEUTROS PCT: 66 %
PLATELETS: 279 10*3/uL (ref 150–400)
RBC: 4.49 MIL/uL (ref 4.22–5.81)
RDW: 13.7 % (ref 11.5–15.5)
WBC: 5.3 10*3/uL (ref 4.0–10.5)

## 2016-02-22 LAB — BASIC METABOLIC PANEL
ANION GAP: 5 (ref 5–15)
BUN: 13 mg/dL (ref 6–20)
CHLORIDE: 103 mmol/L (ref 101–111)
CO2: 29 mmol/L (ref 22–32)
Calcium: 9 mg/dL (ref 8.9–10.3)
Creatinine, Ser: 1.31 mg/dL — ABNORMAL HIGH (ref 0.61–1.24)
GFR calc Af Amer: >60 mL/min (ref >60)
GLUCOSE: 105 mg/dL — AB (ref 65–99)
POTASSIUM: 4.1 mmol/L (ref 3.5–5.1)
Sodium: 137 mmol/L (ref 135–145)

## 2016-02-22 LAB — BRAIN NATRIURETIC PEPTIDE: B NATRIURETIC PEPTIDE 5: 124.3 pg/mL — AB (ref 0.0–100.0)

## 2016-02-22 LAB — TROPONIN I
Troponin I: <0.03 ng/mL (ref <0.031)
Troponin I: <0.03 ng/mL (ref <0.031)

## 2016-02-22 MED ORDER — FUROSEMIDE 10 MG/ML IJ SOLN
40.0000 mg | Freq: Two times a day (BID) | INTRAMUSCULAR | Status: DC
  Administered 2016-02-22 – 2016-02-23 (×3): 40 mg via INTRAVENOUS
  Filled 2016-02-22 (×3): qty 4

## 2016-02-22 MED ORDER — FUROSEMIDE 40 MG PO TABS: 20.0000 mg | ORAL_TABLET | Freq: Once | ORAL | Status: DC

## 2016-02-22 MED ORDER — HYDROXYZINE HCL 50 MG PO TABS
50.0000 mg | ORAL_TABLET | Freq: Three times a day (TID) | ORAL | Status: DC | PRN
  Administered 2016-02-22 – 2016-02-23 (×2): 50 mg via ORAL
  Filled 2016-02-22 (×2): qty 1

## 2016-02-22 MED ORDER — FUROSEMIDE 20 MG PO TABS: 20.0000 mg | ORAL_TABLET | Freq: Every day | ORAL | Status: DC

## 2016-02-22 MED ORDER — ACETAMINOPHEN 325 MG PO TABS: 650.0000 mg | ORAL_TABLET | ORAL | Status: DC | PRN

## 2016-02-22 MED ORDER — ISOSORBIDE MONONITRATE ER 30 MG PO TB24
120.0000 mg | ORAL_TABLET | Freq: Every day | ORAL | Status: DC
  Administered 2016-02-23 – 2016-02-26 (×4): 120 mg via ORAL
  Filled 2016-02-22 (×4): qty 4

## 2016-02-22 MED ORDER — SODIUM CHLORIDE 0.9% FLUSH: 3.0000 mL | INTRAVENOUS | Status: DC | PRN

## 2016-02-22 MED ORDER — POTASSIUM CHLORIDE CRYS ER 20 MEQ PO TBCR
20.0000 meq | EXTENDED_RELEASE_TABLET | Freq: Two times a day (BID) | ORAL | Status: DC
  Administered 2016-02-22 – 2016-02-26 (×8): 20 meq via ORAL
  Filled 2016-02-22 (×8): qty 1

## 2016-02-22 MED ORDER — METOPROLOL TARTRATE 50 MG PO TABS
100.0000 mg | ORAL_TABLET | Freq: Two times a day (BID) | ORAL | Status: DC
  Administered 2016-02-22 – 2016-02-26 (×7): 100 mg via ORAL
  Filled 2016-02-22 (×8): qty 2

## 2016-02-22 MED ORDER — LISINOPRIL 20 MG PO TABS: 40.0000 mg | ORAL_TABLET | Freq: Every day | ORAL | Status: DC

## 2016-02-22 MED ORDER — PALIPERIDONE ER 3 MG PO TB24
3.0000 mg | ORAL_TABLET | Freq: Every day | ORAL | Status: DC
  Administered 2016-02-23 – 2016-02-26 (×4): 3 mg via ORAL
  Filled 2016-02-22 (×4): qty 1

## 2016-02-22 MED ORDER — LABETALOL HCL 5 MG/ML IV SOLN
10.0000 mg | Freq: Once | INTRAVENOUS | Status: DC
  Filled 2016-02-22: qty 4

## 2016-02-22 MED ORDER — ASPIRIN EC 81 MG PO TBEC
81.0000 mg | DELAYED_RELEASE_TABLET | Freq: Every day | ORAL | Status: DC
  Administered 2016-02-23 – 2016-02-26 (×4): 81 mg via ORAL
  Filled 2016-02-22 (×4): qty 1

## 2016-02-22 MED ORDER — LABETALOL HCL 5 MG/ML IV SOLN
20.0000 mg | Freq: Once | INTRAVENOUS | Status: AC
  Administered 2016-02-22: 20 mg via INTRAVENOUS

## 2016-02-22 MED ORDER — SODIUM CHLORIDE 0.9 % IV SOLN
250.0000 mL | INTRAVENOUS | Status: DC | PRN
Start: 2016-02-22 — End: 2016-02-24

## 2016-02-22 MED ORDER — ZIPRASIDONE HCL 80 MG PO CAPS
80.0000 mg | ORAL_CAPSULE | Freq: Two times a day (BID) | ORAL | Status: DC
  Administered 2016-02-23 – 2016-02-26 (×7): 80 mg via ORAL
  Filled 2016-02-22 (×8): qty 1

## 2016-02-22 MED ORDER — ONDANSETRON HCL 4 MG/2ML IJ SOLN
4.0000 mg | Freq: Four times a day (QID) | INTRAMUSCULAR | Status: DC | PRN
Start: 2016-02-22 — End: 2016-02-26

## 2016-02-22 MED ORDER — TRAZODONE HCL 50 MG PO TABS
150.0000 mg | ORAL_TABLET | Freq: Every day | ORAL | Status: DC
  Administered 2016-02-22 – 2016-02-25 (×4): 150 mg via ORAL
  Filled 2016-02-22 (×4): qty 3

## 2016-02-22 MED ORDER — SODIUM CHLORIDE 0.9% FLUSH
3.0000 mL | Freq: Two times a day (BID) | INTRAVENOUS | Status: DC
  Administered 2016-02-22 – 2016-02-23 (×3): 3 mL via INTRAVENOUS

## 2016-02-22 MED ORDER — FUROSEMIDE 10 MG/ML IJ SOLN
20.0000 mg | Freq: Once | INTRAMUSCULAR | Status: AC
  Administered 2016-02-22: 20 mg via INTRAVENOUS
  Filled 2016-02-22: qty 4

## 2016-02-22 MED ORDER — HYDRALAZINE HCL 20 MG/ML IJ SOLN: 20.0000 mg | Freq: Once | INTRAMUSCULAR | Status: DC

## 2016-02-22 MED ORDER — LABETALOL HCL 5 MG/ML IV SOLN
20.0000 mg | INTRAVENOUS | Status: DC | PRN
  Filled 2016-02-22: qty 4

## 2016-02-22 MED ORDER — HYDRALAZINE HCL 50 MG PO TABS
100.0000 mg | ORAL_TABLET | Freq: Three times a day (TID) | ORAL | Status: DC
  Administered 2016-02-22 – 2016-02-26 (×12): 100 mg via ORAL
  Filled 2016-02-22 (×12): qty 2

## 2016-02-22 MED ORDER — PRAZOSIN HCL 2 MG PO CAPS
2.0000 mg | ORAL_CAPSULE | Freq: Two times a day (BID) | ORAL | Status: DC
  Administered 2016-02-22 – 2016-02-26 (×8): 2 mg via ORAL
  Filled 2016-02-22 (×11): qty 1

## 2016-02-22 NOTE — [facility] Group Notes (Signed)
[facility] Group Notes: (Clinical Social Work)   02/22/2016      Type of Therapy:  Group Therapy   Participation Level:  Did Not Attend despite MHT prompting   Orine Goga Grossman-Orr, LCSW 02/22/2016, 11:41 AM     

## 2016-02-22 NOTE — Progress Notes (Signed)
D) Pt rates his depression, hopelessness and anxiety all at a 7. Admits to thoughts of SI. Denies HI. Pt.has noises with his expirations. Blood pressure high this morning. NP is aware and requested for internal medicine to come and reevaluate Pt's condition. Sitting in the dayroom quietly and staying mostly to himself. A) Given support, reassurance and praise. Provided with a 1:1. Spoke with the NP about Pt's breathing

## 2016-02-22 NOTE — ED Provider Notes (Signed)
CSN: [id number]     Arrival date & time 02/22/16  1130 History   First MD Initiated Contact with Patient 02/22/16 1152     Chief Complaint  Patient presents with  . Shortness of Breath     (Consider location/radiation/quality/duration/timing/severity/associated sxs/prior Treatment) The history is provided by the patient and medical records.     Patient who is currently inpatient at [facility] sent to ED for evaluation of heavy breathing.  Pt states he has had heavy breathing x 3 years, has improved somewhat a few years ago when he was eating better and exercising.  Has had increased swelling in his hands recently but denies any change in his lower extremity edema.  Does note that he sleeps sitting up due to paranoia and his swelling in his legs starts to feel painful.  Denies denies fevers, cough, chest pain.   Patient sent by inpatient behavioral health for increased SOB.  They consulted internal medicine and was advised to come to ED.  ED MD concerned patient may have increased SOB secondary to uncontrolled HTN.    Past Medical History  Diagnosis Date  . Paranoid (HCC)   . AVM (arteriovenous malformation)   . Hypertension   . Suicide attempt [facility](HCC)    History reviewed. No pertinent past surgical history. Family History  Problem Relation Age of Onset  . Cancer Mother   . Bipolar disorder Maternal Aunt   . Bipolar disorder Maternal Aunt    Social History  Substance Use Topics  . Smoking status: Current Some Day Smoker    Types: Cigarettes  . Smokeless tobacco: None  . Alcohol Use: Yes    Review of Systems  All other systems reviewed and are negative.     Allergies  Review of patient's allergies indicates no known allergies.  Home Medications   Prior to Admission medications   Medication Sig Start Date End Date Taking? Authorizing Provider  aspirin EC 81 MG tablet Take 81 mg by mouth daily.    Historical Provider, MD  Aspirin-Salicylamide-Caffeine (BC  HEADACHE POWDER PO) Take 1 packet by mouth every 6 (six) hours as needed (for pain.).    Historical Provider, MD  hydrALAZINE (APRESOLINE) 100 MG tablet Take 100 mg by mouth every 8 (eight) hours.     Historical Provider, MD  hydrOXYzine (ATARAX/VISTARIL) 50 MG tablet Take 50 mg by mouth 3 (three) times daily as needed (for anxiety).    Historical Provider, MD  isosorbide mononitrate (IMDUR) 120 MG 24 hr tablet Take 120 mg by mouth daily.    Historical Provider, MD  lisinopril (PRINIVIL,ZESTRIL) 40 MG tablet Take 40 mg by mouth daily.    Historical Provider, MD  metoprolol (LOPRESSOR) 100 MG tablet Take 100 mg by mouth 2 (two) times daily.    Historical Provider, MD  traZODone (DESYREL) 150 MG tablet Take 150 mg by mouth at bedtime.    Historical Provider, MD  ziprasidone (GEODON) 80 MG capsule Take 80 mg by mouth 2 (two) times daily.    Historical Provider, MD   BP 178/107 mmHg  Pulse 72  Temp(Src) 97.9 F (36.6 C) (Oral)  Resp 18  Ht 5\' 9"  (1.753 m)  Wt 184.614 kg  BMI 60.08 kg/m2  SpO2 96% Physical Exam  Constitutional: He appears well-developed and well-nourished. No distress.  Morbidly obese  HENT:  Head: Normocephalic and atraumatic.  Neck: Neck supple.  Pulmonary/Chest: Effort normal.  Musculoskeletal: He exhibits edema (tight nonpitting edema of bilateral lower extremities, bilateral hands with  edema).  Neurological: He is alert.  Skin: He is not diaphoretic.  Healing lacerations of volar wrists bilaterally.  Left wrist with sutures in place.  No erythema, edema, warmth, discharge, or tenderness   Nursing note and vitals reviewed.   ED Course  Procedures (including critical care time) Labs Review Labs Reviewed  BASIC METABOLIC PANEL - Abnormal; Notable for the following:    Glucose, Bld 114 (*)    Creatinine, Ser 1.39 (*)    GFR calc non Af Amer 59 (*)    All other components within normal limits  BRAIN NATRIURETIC PEPTIDE - Abnormal; Notable for the following:    B  Natriuretic Peptide 124.3 (*)    All other components within normal limits  CBC WITH DIFFERENTIAL/PLATELET - Abnormal; Notable for the following:    Hemoglobin 12.0 (*)    HCT 36.7 (*)    All other components within normal limits  BASIC METABOLIC PANEL - Abnormal; Notable for the following:    Glucose, Bld 105 (*)    Creatinine, Ser 1.31 (*)    All other components within normal limits  TROPONIN I    Imaging Review Dg Chest 2 View  02/22/2016  CLINICAL DATA:  Shortness of breath.  Peripheral edema . EXAM: CHEST  2 VIEW COMPARISON:  None. FINDINGS: Mild enlargement of the cardiac silhouette. Otherwise normal mediastinal contour. No pneumothorax. No pleural effusion. Cephalization of the pulmonary vasculature without overt pulmonary edema. Curvilinear opacities at both lung bases. IMPRESSION: 1. Mild enlargement of the cardiac silhouette. Cephalization of the pulmonary vasculature without overt pulmonary edema. 2. Mild curvilinear bibasilar lung opacities, favor scarring or atelectasis. Electronically Signed   By: Delbert Phenix M.D.   On: 02/22/2016 12:44   I have personally reviewed and evaluated these images and lab results as part of my medical decision-making.   EKG Interpretation None       ED ECG REPORT   Date: 02/22/2016  Rate: 65  Rhythm: normal sinus rhythm  QRS Axis: left  Intervals: PR prolonged  ST/T Wave abnormalities: nonspecific T wave changes  Conduction Disutrbances:left anterior fascicular block  Narrative Interpretation:   Old EKG Reviewed: none available  I have personally reviewed the EKG tracing and agree with the computerized printout as noted.   MDM   Final diagnoses:  Dyspnea  Peripheral edema  Accelerated hypertension  Congestive heart failure, unspecified congestive heart failure chronicity, unspecified congestive heart failure type (HCC)   Afebrile, nontoxic patient with chronic increased work of breathing and peripheral edema.  Is on HCTZ,  lisinopril, metoprolol. CXR and BNP concerning for early/mild CHF.  Given uncontrolled HTN and increased edema, worsening dyspnea admitted to [facility], Dr Montez Morita accepting.      Trixie Dredge, PA-C 02/22/16 1954  Tilden Fossa, MD 02/23/16 1255

## 2016-02-22 NOTE — Progress Notes (Signed)
D: Pt is alert and oriented x4. Pt endorsed severe anxiety of 10 on a 0-10 anxiety scale; Pt was observed breathing heavily; Pt also endorsed moderate depression. Pt verbalized that he would [facility] to [facility]; states, "I feel that is what I need to be where I need to be."  Pt however, denied pain, and SI/HI. Pt at the time of assessment also denies AVH; he states, "Everything has been quiet." Pt remained very cooperative. A: Medications offered as prescribed.  Support, encouragement, and safe environment provided.  15-minute safety checks continue. R: Pt was med compliant.  Pt attended group. Safety checks continue.

## 2016-02-22 NOTE — ED Notes (Signed)
He had had a prior chart and has been here several hours today with meds given plus further tests/treatments; please see that chart from today also.  As I write this, his sitter is still with him and pt. Is in no distress.

## 2016-02-22 NOTE — ED Provider Notes (Signed)
Pt has two medical charts.  Please see my full ED note from 02/22/2016 under CSN [id number]  Trixie Dredgemily Evonte Prestage, PA-C 02/22/16 1956  Tilden FossaElizabeth Rees, MD 02/23/16 1257

## 2016-02-22 NOTE — ED Notes (Signed)
He remains in the presence of his sitter; and thanks me for the lunch he is eating as I write this.

## 2016-02-22 NOTE — ED Notes (Signed)
He states he is here because "last night it felt like all my fat came up toward my throat and I got short of breath".  He is in no distress in the continuing presence of his [facility] S[facility].  He denies cough/fever/nn/v/d/dysuria.

## 2016-02-22 NOTE — ED Notes (Signed)
He continues to be in no distress. I have just been asked by bed control to d/c this chart, which I will do now.

## 2016-02-22 NOTE — Progress Notes (Signed)
[facility] MD Progress Note  02/22/2016 1:14 PM Timothy Jackson  MRN:  [id number] Subjective:  Patient states " I am still not sleeping well at night."  Objective: Timothy Jackson is awake, alert and oriented X3. found resting in bed. Patient reports " I am too tired to move and my feet hurt because I couldn't elevated theme last night."  Patient reports " I was doing fine with benadryl and vistaril during my last hospitalization." Denies suicidal or homicidal ideation. Denies auditory or visual hallucination and does not appear to be responding to internal stimuli. Patient reports he is unable to sleep throughout the night. Patient reports he is medication compliant without mediation side effects. Report learning new coping skills on"how to handle anxiety."  States his depression 2/10.Reports good appetite. Support, encouragement and reassurance was provided.   02/22/2016-Patient has labored breathing sitting and ambulation. Patient reports I just don't feel well today.  Patient reports bilateral foot pain/swelling. Denies H/A or dizziness. Internal Medicine paged by MD Lucianne Muss. Advised to take pt to ED. (MDCarter).   02/21/2016 HTN:-patient is asymptomatic at this time. Noted BP 158/88 with downward trend. MD [facility] will reassess in 2 to 3 days pending BP and patient symptoms. [phone] per MD.    Principal Problem: MDD (major depressive disorder), recurrent, severe, with psychosis (HCC) Diagnosis:   Patient Active Problem List   Diagnosis Date Noted  . Suicide attempt (HCC) [T14.91]   . MDD (major depressive disorder), recurrent, severe, with psychosis (HCC) [F33.3] 02/17/2016  . Cannabis use disorder, severe, dependence (HCC) [F12.20] 02/17/2016  . Essential hypertension [I10] 02/17/2016  . History of arteriovenous malformation (AVM) [Z86.79] 02/17/2016  . Morbid obesity (HCC) [E66.01] 02/17/2016   Total Time spent with patient: 25 minutes  Past Psychiatric History: Please see  H&P.   Past Medical History:  Past Medical History  Diagnosis Date  . Paranoid (HCC)   . AVM (arteriovenous malformation)   . Hypertension   . Suicide attempt (HCC)    No past surgical history on file. Family History:  Family History  Problem Relation Age of Onset  . Cancer Mother   . Bipolar disorder Maternal Aunt   . Bipolar disorder Maternal Aunt    Family Psychiatric  History: Please see H&P.  Social History:  History  Alcohol Use  . Yes     History  Drug Use  . Yes  . Special: Marijuana    Social History   Social History  . Marital Status: Single    Spouse Name: N/A  . Number of Children: N/A  . Years of Education: N/A   Social History Main Topics  . Smoking status: Current Some Day Smoker    Types: Cigarettes  . Smokeless tobacco: Not on file  . Alcohol Use: Yes  . Drug Use: Yes    Special: Marijuana  . Sexual Activity: Not on file   Other Topics Concern  . Not on file   Social History Narrative   Additional Social History:                         Sleep: sleep during the day - stays up at night since he is paranoid   Appetite:  Fair  Current Medications: No current facility-administered medications for this encounter.   Current Outpatient Prescriptions  Medication Sig Dispense Refill  . aspirin EC 81 MG tablet Take 81 mg by mouth daily.    . Aspirin-Salicylamide-Caffeine (BC HEADACHE POWDER PO) Take  1 packet by mouth every 6 (six) hours as needed (for pain.).    Marland Kitchen hydrALAZINE (APRESOLINE) 100 MG tablet Take 100 mg by mouth every 8 (eight) hours.     . hydrOXYzine (ATARAX/VISTARIL) 50 MG tablet Take 50 mg by mouth 3 (three) times daily as needed (for anxiety).    . isosorbide mononitrate (IMDUR) 120 MG 24 hr tablet Take 120 mg by mouth daily.    Marland Kitchen lisinopril (PRINIVIL,ZESTRIL) 40 MG tablet Take 40 mg by mouth daily.    . metoprolol (LOPRESSOR) 100 MG tablet Take 100 mg by mouth 2 (two) times daily.    . traZODone (DESYREL) 150 MG  tablet Take 150 mg by mouth at bedtime.    . ziprasidone (GEODON) 80 MG capsule Take 80 mg by mouth 2 (two) times daily.     Facility-Administered Medications Ordered in Other Encounters  Medication Dose Route Frequency Provider Last Rate Last Dose  . acetaminophen (TYLENOL) tablet 650 mg  650 mg Oral Q6H PRN Charm Rings, NP   650 mg at 02/17/16 1947  . alum & mag hydroxide-simeth (MAALOX/MYLANTA) 200-200-20 MG/5ML suspension 30 mL  30 mL Oral Q4H PRN Charm Rings, NP      . amLODipine (NORVASC) tablet 10 mg  10 mg Oral Daily Shon Hale, MD   10 mg at 02/22/16 0801  . ARIPiprazole (ABILIFY) tablet 10 mg  10 mg Oral QHS Jomarie Longs, MD   10 mg at 02/21/16 2132  . ARIPiprazole (ABILIFY) tablet 5 mg  5 mg Oral Daily Saramma Eappen, MD   5 mg at 02/22/16 0800  . aspirin EC tablet 81 mg  81 mg Oral Daily Charm Rings, NP   81 mg at 02/22/16 0756  . benztropine (COGENTIN) tablet 0.5 mg  0.5 mg Oral QHS Jomarie Longs, MD   0.5 mg at 02/21/16 2133  . buPROPion (WELLBUTRIN XL) 24 hr tablet 300 mg  300 mg Oral Daily Oneta Rack, NP   300 mg at 02/22/16 0800  . clonazePAM (KLONOPIN) tablet 0.5 mg  0.5 mg Oral TID Jomarie Longs, MD   0.5 mg at 02/22/16 1308  . cloNIDine (CATAPRES) tablet 0.1 mg  0.1 mg Oral Q12H PRN Jomarie Longs, MD      . diphenhydrAMINE (BENADRYL) capsule 50 mg  50 mg Oral Q8H PRN Jomarie Longs, MD       Or  . diphenhydrAMINE (BENADRYL) injection 50 mg  50 mg Intramuscular Q8H PRN Saramma Eappen, MD      . haloperidol (HALDOL) tablet 10 mg  10 mg Oral Q8H PRN Jomarie Longs, MD       Or  . haloperidol lactate (HALDOL) injection 10 mg  10 mg Intramuscular Q8H PRN Saramma Eappen, MD      . hydrALAZINE (APRESOLINE) tablet 100 mg  100 mg Oral Q8H Charm Rings, NP   100 mg at 02/22/16 1610  . isosorbide mononitrate (IMDUR) 24 hr tablet 120 mg  120 mg Oral Daily Charm Rings, NP   120 mg at 02/22/16 0756  . lisinopril (PRINIVIL,ZESTRIL) tablet 40 mg  40 mg Oral  Daily Charm Rings, NP   40 mg at 02/22/16 0756  . magnesium hydroxide (MILK OF MAGNESIA) suspension 30 mL  30 mL Oral Daily PRN Charm Rings, NP      . metoprolol (LOPRESSOR) tablet 100 mg  100 mg Oral BID Charm Rings, NP   100 mg at 02/22/16 0756  . traZODone (DESYREL) tablet  200 mg  200 mg Oral QHS Oneta Rack, NP   200 mg at 02/21/16 2132    Lab Results:  Results for orders placed or performed during the hospital encounter of 02/16/16 (from the past 48 hour(s))  CBC with Differential     Status: Abnormal   Collection Time: 02/22/16 12:48 PM  Result Value Ref Range   WBC 5.3 4.0 - 10.5 K/uL   RBC 4.49 4.22 - 5.81 MIL/uL   Hemoglobin 12.0 (L) 13.0 - 17.0 g/dL   HCT 81.1 (L) 91.4 - 78.2 %   MCV 81.7 78.0 - 100.0 fL   MCH 26.7 26.0 - 34.0 pg   MCHC 32.7 30.0 - 36.0 g/dL   RDW 95.6 21.3 - 08.6 %   Platelets 279 150 - 400 K/uL   Neutrophils Relative % 66 %   Neutro Abs 3.5 1.7 - 7.7 K/uL   Lymphocytes Relative 23 %   Lymphs Abs 1.2 0.7 - 4.0 K/uL   Monocytes Relative 9 %   Monocytes Absolute 0.5 0.1 - 1.0 K/uL   Eosinophils Relative 2 %   Eosinophils Absolute 0.1 0.0 - 0.7 K/uL   Basophils Relative 0 %   Basophils Absolute 0.0 0.0 - 0.1 K/uL    Blood Alcohol level:  Lab Results  Component Value Date   ETH <5 02/14/2016    Physical Findings: AIMS:  , ,  ,  ,    CIWA:    COWS:     Musculoskeletal: Strength & Muscle Tone: within normal limits Gait & Station: normal Patient leans: N/A  Psychiatric Specialty Exam: Physical Exam  Nursing note and vitals reviewed. Constitutional: He is oriented to person, place, and time.  Neurological: He is alert and oriented to person, place, and time.  Skin: Skin is warm.  Psychiatric: He has a normal mood and affect.    Review of Systems  Psychiatric/Behavioral: Positive for depression, suicidal ideas and hallucinations. The patient is nervous/anxious.   All other systems reviewed and are negative.   There were no  vitals taken for this visit.There is no weight on file to calculate BMI.  General Appearance: Casual  Eye Contact:  Fair  Speech:  Normal Rate  Volume:  Normal  Mood:  Anxious and Depressed  Affect:  Congruent  Thought Process:  Irrelevant and Descriptions of Associations: Circumstantial  Orientation:  Full (Time, Place, and Person)  Thought Content:  Illogical, Delusions, Hallucinations: some vague halucinations last night - unknown if they are tru hallucinations, Paranoid Ideation and Rumination  Suicidal Thoughts:  pt reports feeling unsafe outside the hospital - does not elaborate  pt threatens to kill himself if he were discharged, pt with suicidal gestures this admission while in ED- He cut BL wrist   Homicidal Thoughts:  No  Memory:  Immediate;   Fair Recent;   Fair Remote;   Fair  Judgement:  Impaired  Insight:  Shallow  Psychomotor Activity:  Restlessness  Concentration:  Concentration: Poor and Attention Span: Poor  Recall:  [employer] of Knowledge:  Fair  Language:  Fair  Akathisia:  No  Handed:  Right  AIMS (if indicated):     Assets:  Desire for Improvement  ADL's:  Intact  Cognition:  WNL  Sleep:       I agree with current treatment plan on 02/22/2016, Patient seen face-to-face for psychiatric evaluation follow-up, chart reviewed. Reviewed the information documented and agree with the treatment plan.- transported pt to ED for evaluation. MD.Campose  made aware.   Treatment Plan Summary Daily contact with patient to assess and evaluate symptoms and progress in treatment and Medication management Will taper down and DC Geodon for lack of efficacy. Will increase Abilify 5 mg po daily and 10 mg po qhs for psychosis.  Patient with prolonged QT on EKG  - but QTC - is <500 - will monitor closely. Continue Wellbutrin XL  300 mg po daily for affective sx, with plan to titrate higher. Will continue Klonopin 0.5 mg po tid for anxiety sx. Will make available PRN medications as  per agitation protocol. ContiuneTrazodone 200 mg po qhs for sleep. Restarted  home medication as indicated. [facility] - Pt offered MRI for follow up on AVM - declined.Pt offered MRI today to follow up on AVM - he declined again. [facility] - Pt also could not complete an MMPI - for diagnostic clarification - could not complete it.   Will continue to monitor vitals ,medication compliance and treatment side effects while patient is here.  Will monitor for medical issues as well as call consult as needed. Pt with elevated BP inspite of being on multiple antihypertensive medications . Will call hospitalist consult. Reviewed labs-  TSH - wnl , hba1c- wnl ( [facility]) , cmp shows BUN/CR slightly elevated -  repeat - shows Cr as down trending and BUN normal. CSW will continue working on disposition.  Recreational therapy consult. Patient to participate in therapeutic milieu .        Oneta Rackanika N Lewis, NP 02/22/2016, 1:14 PM Agree with NP Progress Note as above

## 2016-02-22 NOTE — H&P (Signed)
History and Physical    Timothy Jackson XLK:[id number] DOB: 1970/11/05 DOA: 02/22/2016  PCP: No PCP Per Patient  Patient coming from: Behavioral health unit  Chief Complaint: Shortness of breath  HPI: Timothy Jackson is a 45 y.o. 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  Patient reports escalating symptoms for three weeks.  No chest pain.  No syncope.  He has leg swelling, which has been intermittent in the past but now seems to be persistent.  No prior history of CAD.  ED Course: Chest xray concerning for cephalization and cardiomegaly without overt pulmonary edema.  BNP mildy elevated.  BP elevated 170's/100's and has been intermittently IV labetalol.  Lasix 20mg  IV has been given one time.  Hospitalist asked to admit.  Review of Systems: As per HPI otherwise 10 point review of systems negative.   Past Medical History  Diagnosis Date  . Hypertension   . Deaf     Right ear  . History of cardiac cath 02/24/2010     LVH, EF of 50-55%, LAD has 10-15% proximal stenosis, by Dr. Sharyn Lull  . Anxiety, generalized   . Erectile dysfunction   . Delusional disorder (HCC)   . 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 Cardiac catheterization  01/2010    "essentially negative"  . Fracture surgery Left     ORIF left arm as a child     reports that he has been smoking Cigarettes.  He does not have any smokeless tobacco history on file. He reports that he drinks alcohol. He reports that he uses illicit drugs (Marijuana). He is not married.  He has a 15yo son.  He received disability.  No Known  Allergies  Family History  Problem Relation Age of Onset  . Hypertension Father   . Coronary artery disease Maternal Grandmother   . Hypertension Maternal Grandmother   . Diabetes Maternal Grandmother   . Coronary artery disease Maternal Uncle   . Hypertension Maternal Uncle   . Diabetes Maternal Aunt   . Leukemia Maternal Uncle     Prior to Admission medications   Medication Sig Start Date End Date Taking? Authorizing Provider  acetaminophen (TYLENOL) 650 MG CR tablet Take 650 mg by mouth every 6 (six) hours as needed for pain.   Yes Historical Provider, MD  aluminum-magnesium hydroxide-simethicone (MAALOX) 200-200-20 MG/5ML SUSP Take 30 mLs by mouth every 4 (four) hours as needed (indigestion).   Yes Historical Provider, MD  aspirin EC 81 MG EC tablet Take 1 tablet (81 mg total) by mouth daily with breakfast. 02/11/16  Yes Jolanta B Pucilowska, MD  hydrALAZINE (APRESOLINE) 100 MG tablet Take 1 tablet (100 mg total) by mouth every 8 (eight) hours. 02/11/16  Yes Shari Prows, MD  hydrochlorothiazide (HYDRODIURIL) 50 MG tablet Take 1 tablet (50 mg total) by mouth daily with breakfast. 02/11/16  Yes Jolanta B Pucilowska, MD  hydrOXYzine (ATARAX/VISTARIL) 50 MG tablet Take 1 tablet (50 mg total) by mouth 3 (three) times daily as needed for anxiety. 02/11/16  Yes Jolanta B Pucilowska, MD  isosorbide mononitrate (IMDUR) 120 MG 24 hr tablet Take  1 tablet (120 mg total) by mouth daily with breakfast. 02/11/16  Yes Jolanta B Pucilowska, MD  lisinopril (PRINIVIL,ZESTRIL) 40 MG tablet Take 1 tablet (40 mg total) by mouth daily with breakfast. 02/11/16  Yes Jolanta B Pucilowska, MD  metoprolol tartrate (LOPRESSOR) 100 MG tablet Take 1 tablet (100 mg total) by mouth 2 (two) times daily at 8 am and 10 pm. 02/11/16  Yes Jolanta B Pucilowska, MD  paliperidone (INVEGA) 3 MG 24 hr tablet Take 3 mg by mouth daily.   Yes Historical Provider, MD  prazosin (MINIPRESS) 2 MG capsule Take 2 mg by mouth 2 (two)  times daily.   Yes Historical Provider, MD  traZODone (DESYREL) 150 MG tablet Take 1 tablet (150 mg total) by mouth at bedtime. 02/11/16  Yes Jolanta B Pucilowska, MD  ziprasidone (GEODON) 80 MG capsule Take 1 capsule (80 mg total) by mouth 2 (two) times daily before lunch and supper. 02/11/16  Yes Shari Prows, MD    Physical Exam: Filed Vitals:   02/22/16 1835  BP: 172/104  Pulse: 73  Temp: 97.7 F (36.5 C)  TempSrc: Oral  Resp: 18  SpO2: 100%      Constitutional: NAD, calm, comfortable Filed Vitals:   02/22/16 1835  BP: 172/104  Pulse: 73  Temp: 97.7 F (36.5 C)  TempSrc: Oral  Resp: 18  SpO2: 100%   Eyes: PERRL, lids and conjunctivae normal ENMT: Mucous membranes are moist. Posterior pharynx clear of any exudate or lesions.Normal dentition.  Neck: thick but supple Respiratory: clear to auscultation bilaterally, no wheezing, no crackles. Normal respiratory effort. No accessory muscle use.  Cardiovascular: Regular rate and rhythm, no murmurs / rubs / gallops. 1-2+ pitting edema bilaterally.  2+ pedal pulses. No carotid bruits.  Abdomen: protuberant, super obese, no tenderness, no masses palpated. Bowel sounds positive.  Musculoskeletal: no clubbing / cyanosis. No joint deformity upper and lower extremities. Good ROM, no contractures. Normal muscle tone.  Skin: Very Dry.  no rashes, lesions, ulcers. No induration Neurologic: CN 2-12 grossly intact. Sensation intact, Strength 5/5 in all 4.  Psychiatric: Alert and oriented.  Becomes emotional during the interview.  Admits to intermittent SI.   Labs on Admission: I have personally reviewed following labs and imaging studies  BUN normal but creatinine 1.3 (appears new) BNP 124 First troponin negative Hgb 12  Chest xray FINDINGS: Mild enlargement of the cardiac silhouette. Otherwise normal mediastinal contour. No pneumothorax. No pleural effusion. Cephalization of the pulmonary vasculature without overt  pulmonary edema. Curvilinear opacities at both lung bases.  IMPRESSION: 1. Mild enlargement of the cardiac silhouette. Cephalization of the pulmonary vasculature without overt pulmonary edema. 2. Mild curvilinear bibasilar lung opacities, favor scarring or atelectasis.  EKG: Independently reviewed. No acute ST segment elevation.  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  Consider clonidine patch if still refractory after diuresis (clonidine PO would not work well if he has issues with compliance).  AKI --May be pre-renal, related to volume status. --Monitor with diuresis --Hold ACE-I for now  History of paranoid schizophrenia, SI --[facility].  Need to call in the AM to make sure they will do this.   DVT prophylaxis: SCDs Code Status: FULL Family Communication: Patient does not have family present. [facility] Consults called: NONE Admission status: Inpatient telemetry   Jerene Bearsarter,Nissa Stannard Harrison MD [facility]  If 7PM-7AM, please contact  night-coverage [email or web address] Password [email or web address]  02/22/2016, 7:02 PM

## 2016-02-22 NOTE — ED Notes (Signed)
Per pt, [facility]-states he has difficulty breathing when he lays flat-states he is congested and has swollen extremities

## 2016-02-23 ENCOUNTER — Inpatient Hospital Stay (HOSPITAL_COMMUNITY): Payer: Medicare Other

## 2016-02-23 DIAGNOSIS — I509 Heart failure, unspecified: Secondary | ICD-10-CM

## 2016-02-23 LAB — TROPONIN I: Troponin I: <0.03 ng/mL (ref <0.031)

## 2016-02-23 LAB — BASIC METABOLIC PANEL
ANION GAP: 7 (ref 5–15)
BUN: 15 mg/dL (ref 6–20)
CALCIUM: 9 mg/dL (ref 8.9–10.3)
CO2: 30 mmol/L (ref 22–32)
Chloride: 103 mmol/L (ref 101–111)
Creatinine, Ser: 1.38 mg/dL — ABNORMAL HIGH (ref 0.61–1.24)
GFR calc non Af Amer: 60 mL/min — ABNORMAL LOW (ref >60)
Glucose, Bld: 118 mg/dL — ABNORMAL HIGH (ref 65–99)
POTASSIUM: 3.7 mmol/L (ref 3.5–5.1)
Sodium: 140 mmol/L (ref 135–145)

## 2016-02-23 LAB — BLOOD GAS, ARTERIAL
ACID-BASE EXCESS: 4.6 mmol/L — AB (ref 0.0–2.0)
Bicarbonate: 30.1 mEq/L — ABNORMAL HIGH (ref 20.0–24.0)
FIO2: 0.21
O2 Saturation: 92.6 %
PCO2 ART: 51 mmHg — AB (ref 35.0–45.0)
PO2 ART: 70.2 mmHg — AB (ref 80.0–100.0)
Patient temperature: 98.6
TCO2: 27 mmol/L (ref 0–100)
pH, Arterial: 7.389 (ref 7.350–7.450)

## 2016-02-23 LAB — ECHOCARDIOGRAM COMPLETE
HEIGHTINCHES: 69 in
Weight: 6352 oz

## 2016-02-23 LAB — BRAIN NATRIURETIC PEPTIDE: B NATRIURETIC PEPTIDE 5: 82 pg/mL (ref 0.0–100.0)

## 2016-02-23 MED ORDER — PERFLUTREN LIPID MICROSPHERE
1.0000 mL | INTRAVENOUS | Status: AC | PRN
  Administered 2016-02-23: 2 mL via INTRAVENOUS
  Filled 2016-02-23: qty 10

## 2016-02-23 MED ORDER — HEPARIN SODIUM (PORCINE) 5000 UNIT/ML IJ SOLN
5000.0000 [IU] | Freq: Three times a day (TID) | INTRAMUSCULAR | Status: DC
  Administered 2016-02-25 – 2016-02-26 (×3): 5000 [IU] via SUBCUTANEOUS
  Filled 2016-02-23 (×7): qty 1

## 2016-02-23 MED ORDER — AMLODIPINE BESYLATE 10 MG PO TABS
10.0000 mg | ORAL_TABLET | Freq: Every day | ORAL | Status: DC
  Administered 2016-02-23: 10 mg via ORAL
  Filled 2016-02-23: qty 1

## 2016-02-23 NOTE — Consult Note (Signed)
[facility] Consult   Reason for Consult:  Paranoid schizophrenia and recent admit to [facility] Referring Physician:  Dr. [name] Patient Identification: Timothy Jackson MRN:  [id number] Principal Diagnosis: Accelerated hypertension Diagnosis:   Patient Active Problem List   Diagnosis Date Noted  . Hypertension [I10] 02/22/2016  . Accelerated hypertension [I10] 02/22/2016  . SOB (shortness of breath) [R06.02] 02/22/2016  . Acute CHF ([place]) [I50.9] 02/22/2016  . CHF (congestive heart failure) ([place]) [I50.9] 02/22/2016  . 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 HTN (hypertension) [I10] 01/29/2016  . 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 Severe recurrent major depressive disorder with psychotic features ([place]) [F33.3] 01/28/2016  . Cannabis use disorder, moderate, dependence ([place]) [F12.20] 01/28/2016  . Alcohol use disorder, moderate, dependence ([place]) [F10.20] 01/28/2016  . Suicidal ideation [R45.851] 01/28/2016  . Tobacco use disorder [F17.200] 01/28/2016  . Generalized anxiety disorder [F41.1] 05/18/2013    Total Time spent with patient: 1 hour  Subjective:   Timothy Jackson is a 45 y.o. male patient admitted with paranoia, suicide ideation and hypertension.  HPI:  Timothy Jackson is a 45 y.o.Male, seen, chart reviewed for psychiatric consultation and evaluation of depression and paranoid schizophrenia and recently admitted to [facility] psychiatric unit where he stayed about 2 weeks. Patient reportedly continued to feel difficult to trust people around him, emotionally unstable with the depression, anxiety and anger. Patient reported he needs help to go back to the behavioral [place] and probably a to go to [place] for long-term care. Case discussed with Dr. [name] who reported that he will be medically stable soon and he needed transfer to the psychiatric facility may be a  day or 2. Patient requested to contact his psychiatrist at [facility] Dr. Mamie Nick who knows him very well and feels well supported. Patient reported he has been isolated and withdrawn almost 3 years and he is on house and finally has decided to seek help about few weeks ago. Reportedly patient has been noncompliant with his medication management as per the medical records. Patient appeared with mood swings, tearfulness and paranoid thoughts during this visit.  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 Patient reports escalating symptoms for three weeks. No chest pain. No syncope. He has leg swelling, which has been intermittent in the past but now seems to be persistent. No prior history of CAD.   Past Psychiatric History: Patient has been diagnosed with major depressive disorder recurrent and paranoia.  Risk to Self: Is patient at risk for suicide?: Yes Risk to Others:   Prior Inpatient Therapy:   Prior Outpatient Therapy:    Past Medical History:  Past Medical History  Diagnosis Date  . Hypertension   . Deaf     Right ear  . History of cardiac cath 02/24/2010     LVH, EF of 50-55%, LAD has 10-15% proximal stenosis, by Dr. Terrence Dupont  . Anxiety, generalized   . Erectile dysfunction   . Delusional disorder ([place])   . AVM (arteriovenous malformation) brain 04/20/2000    Past Surgical History  Procedure  Laterality Date  . Cardiac catheterization  01/2010    "essentially negative"  . Fracture surgery Left     ORIF left arm as a child   Family History:  Family History  Problem Relation Age of Onset  . Hypertension  Father   . Coronary artery disease Maternal Grandmother   . Hypertension Maternal Grandmother   . Diabetes Maternal Grandmother   . Coronary artery disease Maternal Uncle   . Hypertension Maternal Uncle   . Diabetes Maternal Aunt   . Leukemia Maternal Uncle    Family Psychiatric  History: Unknown Social History:  History  Alcohol Use  . 0.0 oz/week    Comment: daily     History  Drug Use  . Yes  . Special: Marijuana    Comment: Occassional marijuana use    Social History   Social History  . Marital Status: Single    Spouse Name: N/A  . Number of Children: N/A  . Years of Education: N/A   Occupational History  . desk job     Sports administrator   Social History Main Topics  . Smoking status: Current Some Day Smoker    Types: Cigarettes  . Smokeless tobacco: None  . Alcohol Use: 0.0 oz/week     Comment: daily  . Drug Use: Yes    Special: Marijuana     Comment: Occassional marijuana use  . Sexual Activity: Not Currently   Other Topics Concern  . None   Social History Narrative   Additional Social History:    Allergies:  No Known Allergies  Labs:  Results for orders placed or performed during the hospital encounter of 02/22/16 (from the past 48 hour(s))  Troponin I (q 6hr x 3)     Status: None   Collection Time: 02/22/16  7:34 PM  Result Value Ref Range   Troponin I <0.03 <0.031 ng/mL    Comment:        NO INDICATION OF MYOCARDIAL INJURY.   Basic metabolic panel     Status: Abnormal   Collection Time: 02/23/16  1:07 AM  Result Value Ref Range   Sodium 140 135 - 145 mmol/L   Potassium 3.7 3.5 - 5.1 mmol/L   Chloride 103 101 - 111 mmol/L   CO2 30 22 - 32 mmol/L   Glucose, Bld 118 (H) 65 - 99 mg/dL   BUN 15 6 - 20 mg/dL   Creatinine, Ser 1.38 (H) 0.61 - 1.24 mg/dL   Calcium 9.0 8.9 - 10.3 mg/dL   GFR calc non Af Amer 60 (L) >60 mL/min   GFR calc Af Amer >60 >60 mL/min    Comment: (NOTE) The eGFR has been calculated using the CKD EPI equation. This  calculation has not been validated in all clinical situations. eGFR's persistently <60 mL/min signify possible Chronic Kidney Disease.    Anion gap 7 5 - 15  Troponin I (q 6hr x 3)     Status: None   Collection Time: 02/23/16  1:07 AM  Result Value Ref Range   Troponin I <0.03 <0.031 ng/mL    Comment:        NO INDICATION OF MYOCARDIAL INJURY.   Troponin I (q 6hr x 3)     Status: None   Collection Time: 02/23/16  6:47 AM  Result Value Ref Range   Troponin I <0.03 <0.031 ng/mL    Comment:        NO INDICATION OF MYOCARDIAL INJURY.   Brain natriuretic peptide  Status: None   Collection Time: 02/23/16  6:52 AM  Result Value Ref Range   B Natriuretic Peptide 82.0 0.0 - 100.0 pg/mL  Blood gas, arterial     Status: Abnormal   Collection Time: 02/23/16  8:48 AM  Result Value Ref Range   FIO2 0.21    pH, Arterial 7.389 7.350 - 7.450   pCO2 arterial 51.0 (H) 35.0 - 45.0 mmHg   pO2, Arterial 70.2 (L) 80.0 - 100.0 mmHg   Bicarbonate 30.1 (H) 20.0 - 24.0 mEq/L   TCO2 27.0 0 - 100 mmol/L   Acid-Base Excess 4.6 (H) 0.0 - 2.0 mmol/L   O2 Saturation 92.6 %   Patient temperature 98.6    Collection site RIGHT RADIAL    Drawn by COLLECTED BY RT    Sample type ARTERIAL DRAW    Allens test (pass/fail) PASS PASS    Current Facility-Administered Medications  Medication Dose Route Frequency Provider Last Rate Last Dose  . 0.9 %  sodium chloride infusion  250 mL Intravenous PRN Lily Kocher, MD      . acetaminophen (TYLENOL) tablet 650 mg  650 mg Oral Q4H PRN Lily Kocher, MD      . amLODipine (NORVASC) tablet 10 mg  10 mg Oral Daily Thurnell Lose, MD   10 mg at 02/23/16 1051  . aspirin EC tablet 81 mg  81 mg Oral Q breakfast Lily Kocher, MD   81 mg at 02/23/16 0809  . furosemide (LASIX) injection 40 mg  40 mg Intravenous BID Lily Kocher, MD   40 mg at 02/23/16 0809  . heparin injection 5,000 Units  5,000 Units Subcutaneous Q8H Thurnell Lose, MD   5,000 Units at 02/23/16 1050  .  hydrALAZINE (APRESOLINE) tablet 100 mg  100 mg Oral Q8H Lily Kocher, MD   100 mg at 02/23/16 0655  . hydrOXYzine (ATARAX/VISTARIL) tablet 50 mg  50 mg Oral TID PRN Lily Kocher, MD   50 mg at 02/23/16 1025  . isosorbide mononitrate (IMDUR) 24 hr tablet 120 mg  120 mg Oral Q breakfast Lily Kocher, MD   120 mg at 02/23/16 0355  . labetalol (NORMODYNE,TRANDATE) injection 20 mg  20 mg Intravenous Q4H PRN Lily Kocher, MD      . metoprolol (LOPRESSOR) tablet 100 mg  100 mg Oral BID AC & HS Lily Kocher, MD   100 mg at 02/23/16 9741  . ondansetron (ZOFRAN) injection 4 mg  4 mg Intravenous Q6H PRN Lily Kocher, MD      . paliperidone (INVEGA) 24 hr tablet 3 mg  3 mg Oral Daily Lily Kocher, MD   3 mg at 02/23/16 1024  . perflutren lipid microspheres (DEFINITY) IV suspension  1-10 mL Intravenous PRN Lily Kocher, MD      . potassium chloride SA (K-DUR,KLOR-CON) CR tablet 20 mEq  20 mEq Oral BID Lily Kocher, MD   20 mEq at 02/23/16 1024  . prazosin (MINIPRESS) capsule 2 mg  2 mg Oral BID Lily Kocher, MD   2 mg at 02/23/16 1024  . sodium chloride flush (NS) 0.9 % injection 3 mL  3 mL Intravenous Q12H Lily Kocher, MD   3 mL at 02/22/16 2154  . sodium chloride flush (NS) 0.9 % injection 3 mL  3 mL Intravenous PRN Lily Kocher, MD      . traZODone (DESYREL) tablet 150 mg  150 mg Oral QHS Lily Kocher, MD   150 mg at 02/22/16 2152  . ziprasidone (GEODON) capsule 80  mg  80 mg Oral BID AC Lily Kocher, MD        Musculoskeletal: Strength & Muscle Tone: decreased Gait & Station: unable to stand Patient leans: N/A  Psychiatric Specialty Exam: Physical Exam  ROS  Blood pressure 184/96, pulse 78, temperature 98.6 F (37 C), temperature source Oral, resp. rate 20, height 5' 9"  (1.753 m), weight 180.078 kg (397 lb), SpO2 94 %.Body mass index is 58.6 kg/(m^2).  General Appearance: Guarded  Eye Contact:  Good  Speech:  Clear and Coherent and Slow  Volume:  Decreased  Mood:  Anxious and Depressed  Affect:   Depressed and Tearful  Thought Process:  Coherent and Goal Directed  Orientation:  Full (Time, Place, and Person)  Thought Content:  Paranoid Ideation  Suicidal Thoughts:  Yes.  without intent/plan  Homicidal Thoughts:  No  Memory:  Immediate;   Fair Recent;   Fair  Judgement:  Fair  Insight:  Fair  Psychomotor Activity:  Decreased  Concentration:  Concentration: Fair  Recall:  [place] of Knowledge:  Good  Language:  Good  Akathisia:  Negative  Handed:  Right  AIMS (if indicated):     Assets:  Communication Skills Desire for Improvement Housing Leisure Time Resilience Social Support Transportation  ADL's:  Impaired  Cognition:  WNL  Sleep:        Treatment Plan Summary: Daily contact with patient to assess and evaluate symptoms and progress in treatment and Medication management   Chief Technology Officer as patient continued to show symptoms of emotional instability and paranoid thoughts  Continue psychiatric medication management without changes  Refer to the unit social service to contact [facility] psychiatric services tomorrow morning for further information and possible admission.  Disposition: Recommend psychiatric Inpatient admission when medically cleared. Supportive therapy provided about ongoing stressors.  Ambrose Finland, MD 02/23/2016 11:32 AM

## 2016-02-23 NOTE — Progress Notes (Signed)
PROGRESS NOTE                                                                                                                                                                                                             Patient Demographics:    Timothy Jackson, is a 45 y.o. male, DOB - Mar 19, 1971, ZOX:[id number]  Admit date - 02/22/2016   Admitting Physician Timothy LitterNikki Carter, MD  Outpatient Primary MD for the patient is No PCP Per Patient  LOS - 1  Chief Complaint  Patient presents with  . Shortness of Breath  . Suicidal       Brief Narrative     Timothy Jackson is a 45 y.o. 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 Patient reports escalating symptoms for three weeks. No chest pain. No syncope. He has leg swelling, which has been intermittent in the past but now seems to be persistent. No prior history of CAD.  ED Course: Chest xray concerning for encephalization and cardiomegaly without overt pulmonary edema.BNP mildy elevated. BP elevated 170's/100's and has been intermittently IV labetalol. Lasix 20mg  IV has been given one time. Hospitalist asked to admit.    Subjective:    Timothy Jackson today has, No headache, No chest pain, No abdominal pain - No Nausea, No new weakness tingling or numbness, No Cough - Much improved shortness of breath.   Assessment  & Plan :     1.Mild acute on chronic most likely diastolic CHF no echogram in the chart. Echo currently pending, much improved with Lasix and blood pressure control. Likely went into CHF due to hypertensive urgency/crisis.  Continue gentle diuresis along with blood pressure monitoring. Salt and fluid restriction. Daily weights, monitor intake and output.  2. Hypertensive urgency/crisis. Blood pressure medications have been adjusted, Norvasc added, continue Lasix, continue beta blocker, Imdur, hydralazine along with prazosin.  3. ARF on CKG stage II. Baseline creatinine appears to be 1.2. Likely worse due to poorly controlled blood pressure and decompensated CHF, hold ACE inhibitor, continue  diuresis and blood pressure control.  4. Paranoid schizophrenia with suicidal ideation. Sitter bedside. Patient was admitted at [facility], psych has been very consulted likely transfer back tomorrow.  5. History of AVM. Blood pressure control.  6. Morbid obesity. Follow-up with PCP.    Code Status :  Full  Family Communication  :  None present  Disposition Plan  :  [facility] in am  Consults  :  Psych  Procedures  :    TTE  DVT Prophylaxis  :  Heparin   Lab Results  Component Value Date   PLT 291 01/28/2016    Inpatient Medications  Scheduled Meds: . amLODipine  10 mg Oral Daily  . aspirin EC  81 mg Oral Q breakfast  . furosemide  40 mg Intravenous BID  . hydrALAZINE  100 mg Oral Q8H  . isosorbide mononitrate  120 mg Oral Q breakfast  . metoprolol tartrate  100 mg Oral BID AC & HS  . paliperidone  3 mg Oral Daily  . potassium chloride  20 mEq Oral BID  . prazosin  2 mg Oral BID  . sodium chloride flush  3 mL Intravenous Q12H  . traZODone  150 mg Oral QHS  . ziprasidone  80 mg Oral BID AC   Continuous Infusions:  PRN Meds:.sodium chloride, acetaminophen, hydrOXYzine, labetalol, ondansetron (ZOFRAN) IV, sodium chloride flush  Antibiotics  :    Anti-infectives    None         Objective:   Filed Vitals:   02/23/16 0550 02/23/16 0617 02/23/16 0800 02/23/16 0808  BP: 170/95  184/96 184/96  Pulse: 73  78   Temp: 98 F (36.7 C)  98.6 F (37 C)   TempSrc: Oral  Oral   Resp: 20  20   Height:    (1.753 m)    Weight:  180.078 kg (397 lb)    SpO2: 98%  94%     Wt Readings from Last 3 Encounters:  02/23/16 180.078 kg (397 lb)  01/28/16 180.078 kg (397 lb)  01/28/16 142.883 kg (315 lb)     Intake/Output Summary (Last 24 hours) at 02/23/16 1000 Last data filed at 02/23/16 0437  Gross per 24 hour  Intake    160 ml  Output   2700 ml  Net  -2540 ml     Physical Exam  Awake Alert, Oriented X 3, No new F.N deficits, Anxious affect [place].AT,PERRAL Supple Neck,No JVD, No cervical lymphadenopathy appriciated.  Symmetrical Chest wall movement, Good air movement bilaterally, CTAB RRR,No Gallops,Rubs or new Murmurs, No Parasternal Heave +ve B.Sounds, Abd Soft, No tenderness, No organomegaly appriciated, No rebound - guarding or rigidity. No Cyanosis, Clubbing or edema, No new Rash or bruise      Data Review:    CBC No results for input(s): WBC, HGB, HCT, PLT, MCV, MCH, MCHC, RDW, LYMPHSABS, MONOABS, EOSABS, BASOSABS, BANDABS in the last 168 hours.  Invalid input(s): NEUTRABS, BANDSABD  Chemistries   Recent Labs Lab 02/23/16 0107  NA 140  K 3.7  CL 103  CO2 30  GLUCOSE 118*  BUN 15  CREATININE 1.38*  CALCIUM 9.0   ------------------------------------------------------------------------------------------------------------------ No results for input(s): CHOL, HDL, LDLCALC, TRIG, CHOLHDL, LDLDIRECT in the last 72 hours.  Lab Results  Component Value Date   HGBA1C 6.3* 01/29/2016   ------------------------------------------------------------------------------------------------------------------ No results for input(s): TSH, T4TOTAL, T3FREE, THYROIDAB in the last 72 hours.  Invalid input(s): FREET3 ------------------------------------------------------------------------------------------------------------------ No results for input(s): VITAMINB12, FOLATE, FERRITIN, TIBC, IRON, RETICCTPCT  in the last 72 hours.  Coagulation profile No results for input(s): INR,  PROTIME in the last 168 hours.  No results for input(s): DDIMER in the last 72 hours.  Cardiac Enzymes  Recent Labs Lab 02/22/16 1934 02/23/16 0107 02/23/16 0647  TROPONINI <0.03 <0.03 <0.03   ------------------------------------------------------------------------------------------------------------------    Component Value Date/Time   BNP 82.0 02/23/2016 1610    Micro Results No results found for this or any previous visit (from the past 240 hour(s)).  Radiology Reports Dg Chest Port 1 View  02/23/2016  CLINICAL DATA:  Shortness of breath EXAM: PORTABLE CHEST 1 VIEW COMPARISON:  05/15/2013 chest radiograph. FINDINGS: Mild-to-moderate enlargement of the cardiac silhouette, increased from the most recent chest radiograph of 05/15/2013. Otherwise stable mediastinal contour. No pneumothorax. Trace left pleural effusion. No right pleural effusion. Borderline mild pulmonary edema. IMPRESSION: 1. New enlargement of the cardiac silhouette, cannot exclude pericardial effusion. Correlate with echocardiography as clinically warranted. 2. Borderline mild pulmonary edema. 3. Trace left pleural effusion. Electronically Signed   By: Delbert Phenix M.D.   On: 02/23/2016 08:39    Time Spent in minutes  30   Gildo Crisco K M.D on 02/23/2016 at 10:00 AM  Between 7am to 7pm - Pager - [phone]  After 7pm go to [email or web address] - password [facility]  [facility] -  Office  [phone]

## 2016-02-23 NOTE — Progress Notes (Signed)
Echocardiogram 2D Echocardiogram has been performed.  Marisue Humblelexis N Sabrin Dunlevy 02/23/2016, 10:47 AM

## 2016-02-24 ENCOUNTER — Encounter (HOSPITAL_COMMUNITY): Payer: Self-pay

## 2016-02-24 ENCOUNTER — Inpatient Hospital Stay (HOSPITAL_COMMUNITY): Payer: Medicare Other

## 2016-02-24 LAB — CBC
HEMATOCRIT: 39.6 % (ref 39.0–52.0)
HEMOGLOBIN: 12.7 g/dL — AB (ref 13.0–17.0)
MCH: 26.3 pg (ref 26.0–34.0)
MCHC: 32.1 g/dL (ref 30.0–36.0)
MCV: 82.2 fL (ref 78.0–100.0)
Platelets: 266 10*3/uL (ref 150–400)
RBC: 4.82 MIL/uL (ref 4.22–5.81)
RDW: 13.9 % (ref 11.5–15.5)
WBC: 5.2 10*3/uL (ref 4.0–10.5)

## 2016-02-24 LAB — BASIC METABOLIC PANEL
ANION GAP: 7 (ref 5–15)
BUN: 18 mg/dL (ref 6–20)
CO2: 32 mmol/L (ref 22–32)
Calcium: 9 mg/dL (ref 8.9–10.3)
Chloride: 102 mmol/L (ref 101–111)
Creatinine, Ser: 1.59 mg/dL — ABNORMAL HIGH (ref 0.61–1.24)
GFR calc Af Amer: 58 mL/min — ABNORMAL LOW (ref >60)
GFR, EST NON AFRICAN AMERICAN: 50 mL/min — AB (ref >60)
Glucose, Bld: 114 mg/dL — ABNORMAL HIGH (ref 65–99)
POTASSIUM: 4.3 mmol/L (ref 3.5–5.1)
SODIUM: 141 mmol/L (ref 135–145)

## 2016-02-24 LAB — MAGNESIUM: Magnesium: 2.1 mg/dL (ref 1.7–2.4)

## 2016-02-24 MED ORDER — HYDRALAZINE HCL 20 MG/ML IJ SOLN: 10.0000 mg | Freq: Four times a day (QID) | INTRAMUSCULAR | Status: DC | PRN

## 2016-02-24 MED ORDER — SODIUM CHLORIDE 0.9 % IV SOLN: 250.0000 mL | INTRAVENOUS | Status: DC | PRN

## 2016-02-24 NOTE — Progress Notes (Signed)
PROGRESS NOTE                                                                                                                                                                                                             Patient Demographics:    Timothy NeedsOtis Jackson, is a 45 y.o. male, DOB - 03-Feb-1971, ZOX:[id number]  Admit date - 02/22/2016   Admitting Physician Michael LitterNikki Carter, MD  Outpatient Primary MD for the patient is No PCP Per Patient  LOS - 2  Chief Complaint  Patient presents with  . Shortness of Breath  . Suicidal       Brief Narrative     Timothy Jackson is a 45 y.o. 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 Patient reports escalating symptoms for three weeks. No chest pain. No syncope. He has leg swelling, which has been intermittent in the past but now seems to be persistent. No prior history of CAD.  ED Course: Chest xray concerning for encephalization and cardiomegaly without overt pulmonary edema.BNP mildy elevated. BP elevated 170's/100's and has been intermittently IV labetalol. Lasix 20mg  IV has been given one time. Hospitalist asked to admit.    Subjective:    Timothy Jackson today has, No headache, No chest pain, No abdominal pain - No Nausea, No new weakness tingling or numbness, No Cough - Much improved shortness of breath.   Assessment  & Plan :     1. Acute on chronic diastolic CHF EF 60%. He is much improved after diuresis with Lasix and blood pressure control. Likely went into CHF due to hypertensive urgency/crisis. Continue Salt and fluid restriction.  Daily weights, monitor intake and output. We'll hold off Lasix as creatinine has bumped slightly.  2. Hypertensive urgency/crisis. Blood pressure medications have been adjusted, into ARF will allow blood pressure to run slightly higher than what it is right now, hold Norvasc, add when necessary IV hydralazine, continue beta blocker, Imdur, hydralazine along with prazosin.  3. ARF on CKG stage II. Baseline creatinine appears to  be 1.2. Likely worse due to poorly controlled blood pressure and decompensated CHF, blood pressure has stabilized, hold off on Lasix as creatinine has bumped slightly, check renal ultrasound and repeat BMP in the morning.  4. Paranoid schizophrenia with suicidal ideation. Sitter bedside. Patient was admitted at [facility], psychiatry consulted, if creatinine better we'll discharge back to [facility] tomorrow.  5. History of AVM. Blood pressure control.  6. Morbid obesity. Follow-up with PCP.    Code Status :  Full  Family Communication  :  None present  Disposition Plan  :  [facility] on 02/25/2016 if creatinine is stable  Consults  :  Psych  Procedures  :    TTE -   Left ventricle: The cavity size was normal. There was moderate  concentric hypertrophy. Systolic function was normal. The  estimated ejection fraction was in the range of 60% to 65%. Wall  motion was normal; there were no regional wall motion  abnormalities. Doppler parameters are consistent with abnormal  left ventricular relaxation (grade 1 diastolic dysfunction). - Aortic valve: Trileaflet; normal thickness leaflets. There was no  regurgitation. - Aortic root: The aortic root was normal in size. - Mitral valve: Structurally normal valve. - Right ventricle: The cavity size was normal. Wall thickness was  normal. Systolic function was normal. - Tricuspid valve: There was mild regurgitation. - Pulmonary arteries: Systolic pressure was within the normal  range. - Inferior vena cava: The vessel was  normal in size.  DVT Prophylaxis  :  Heparin   Lab Results  Component Value Date   PLT 266 02/24/2016    Inpatient Medications  Scheduled Meds: . aspirin EC  81 mg Oral Q breakfast  . heparin subcutaneous  5,000 Units Subcutaneous Q8H  . hydrALAZINE  100 mg Oral Q8H  . isosorbide mononitrate  120 mg Oral Q breakfast  . metoprolol tartrate  100 mg Oral BID AC & HS  . paliperidone  3 mg Oral Daily  . potassium chloride  20 mEq Oral BID  . prazosin  2 mg Oral BID  . traZODone  150 mg Oral QHS  . ziprasidone  80 mg Oral BID AC   Continuous Infusions:  PRN Meds:.acetaminophen, hydrALAZINE, hydrOXYzine, labetalol, ondansetron (ZOFRAN) IV  Antibiotics  :    Anti-infectives    None         Objective:   Filed Vitals:   02/23/16 1414 02/23/16 2201 02/24/16 0508 02/24/16 0813  BP: 149/79 150/81 137/73 154/81  Pulse: 61 61 60 73  Temp: 98 F (36.7 C) 97.8 F (36.6 C) 97.8 F (36.6 C)   TempSrc: Oral Axillary Axillary   Resp: 18 18 18    Height:      Weight:      SpO2: 94% 98% 98%     Wt Readings from Last 3 Encounters:  02/23/16 180.078 kg (397 lb)  02/22/16 184.614 kg (407 lb)  01/28/16 180.078 kg (397 lb)     Intake/Output Summary (Last 24 hours) at 02/24/16 0918 Last data filed at 02/24/16 0738  Gross per 24 hour  Intake    340 ml  Output      0 ml  Net    340 ml     Physical Exam  Awake Alert, Oriented X 3, No new F.N deficits, Anxious affect [place].AT,PERRAL Supple Neck,No JVD, No cervical lymphadenopathy appriciated.  Symmetrical Chest wall movement, Good air movement bilaterally, CTAB RRR,No Gallops,Rubs or new Murmurs, No Parasternal Heave +ve B.Sounds, Abd Soft, No tenderness, No  organomegaly appriciated, No rebound - guarding or rigidity. No Cyanosis, Clubbing or edema, No new Rash or bruise      Data Review:    CBC  Recent Labs Lab 02/22/16 1248 02/24/16 0428  WBC 5.3 5.2  HGB 12.0* 12.7*  HCT 36.7* 39.6  PLT 279 266  MCV 81.7 82.2    MCH 26.7 26.3  MCHC 32.7 32.1  RDW 13.7 13.9  LYMPHSABS 1.2  --   MONOABS 0.5  --   EOSABS 0.1  --   BASOSABS 0.0  --     Chemistries   Recent Labs Lab 02/18/16 0640 02/22/16 1248 02/23/16 0107 02/24/16 0428  NA 140 137 140 141  K 4.3 4.1 3.7 4.3  CL 105 103 103 102  CO2 32  GLUCOSE 114* 105* 118* 114*  BUN CREATININE 1.39* 1.31* 1.38* 1.59*  CALCIUM 9.4 9.0 9.0 9.0  MG  --   --   --  2.1   ------------------------------------------------------------------------------------------------------------------ No results for input(s): CHOL, HDL, LDLCALC, TRIG, CHOLHDL, LDLDIRECT in the last 72 hours.  Lab Results  Component Value Date   HGBA1C 6.3* 01/29/2016   ------------------------------------------------------------------------------------------------------------------ No results for input(s): TSH, T4TOTAL, T3FREE, THYROIDAB in the last 72 hours.  Invalid input(s): FREET3 ------------------------------------------------------------------------------------------------------------------ No results for input(s): VITAMINB12, FOLATE, FERRITIN, TIBC, IRON, RETICCTPCT in the last 72 hours.  Coagulation profile No results for input(s): INR, PROTIME in the last 168 hours.  No results for input(s): DDIMER in the last 72 hours.  Cardiac Enzymes  Recent Labs Lab 02/22/16 1934 02/23/16 0107 02/23/16 0647  TROPONINI <0.03 <0.03 <0.03   ------------------------------------------------------------------------------------------------------------------    Component Value Date/Time   BNP 82.0 02/23/2016 4098    Micro Results No results found for this or any previous visit (from the past 240 hour(s)).  Radiology Reports Dg Chest 2 View  02/22/2016  CLINICAL DATA:  Shortness of breath.  Peripheral edema . EXAM: CHEST  2 VIEW COMPARISON:  None. FINDINGS: Mild enlargement of the cardiac silhouette. Otherwise normal mediastinal contour. No pneumothorax.  No pleural effusion. Cephalization of the pulmonary vasculature without overt pulmonary edema. Curvilinear opacities at both lung bases. IMPRESSION: 1. Mild enlargement of the cardiac silhouette. Cephalization of the pulmonary vasculature without overt pulmonary edema. 2. Mild curvilinear bibasilar lung opacities, favor scarring or atelectasis. Electronically Signed   By: Delbert Phenix M.D.   On: 02/22/2016 12:44   US Renal  02/24/2016  CLINICAL DATA:  Acute renal failure EXAM: RENAL / URINARY TRACT ULTRASOUND COMPLETE COMPARISON:  None. FINDINGS: Right Kidney: Length: 12.6 cm. Echogenicity within normal limits. No mass or hydronephrosis visualized. Left Kidney: Length: 12.5 cm. Echogenicity within normal limits. No mass or hydronephrosis visualized. Bladder: Nondistended IMPRESSION: No acute abnormality noted. Electronically Signed   By: Alcide Clever M.D.   On: 02/24/2016 07:27   Dg Chest Port 1 View  02/23/2016  CLINICAL DATA:  Shortness of breath EXAM: PORTABLE CHEST 1 VIEW COMPARISON:  05/15/2013 chest radiograph. FINDINGS: Mild-to-moderate enlargement of the cardiac silhouette, increased from the most recent chest radiograph of 05/15/2013. Otherwise stable mediastinal contour. No pneumothorax. Trace left pleural effusion. No right pleural effusion. Borderline mild pulmonary edema. IMPRESSION: 1. New enlargement of the cardiac silhouette, cannot exclude pericardial effusion. Correlate with echocardiography as clinically warranted. 2. Borderline mild pulmonary edema. 3. Trace left pleural effusion. Electronically Signed   By: Delbert Phenix M.D.   On: 02/23/2016 08:39    Time Spent in minutes  30  Leroy Sea M.D on 02/24/2016 at 9:18 AM  Between 7am to 7pm - Pager - [phone]  After 7pm go to [email or web address] - password [facility]  [facility] -  Office  [phone]

## 2016-02-24 NOTE — Consult Note (Signed)
[place] Psychiatry Consult follow-up  Reason for Consult:  Paranoid schizophrenia and recent admit to [facility] Referring Physician:  Dr. [name] Patient Identification: Timothy Jackson MRN:  [id number] Principal Diagnosis: Severe recurrent major depressive disorder with psychotic features [facility]) Diagnosis:   Patient Active Problem List   Diagnosis Date Noted  . Hypertension [I10] 02/22/2016  . Accelerated hypertension [I10] 02/22/2016  . SOB (shortness of breath) [R06.02] 02/22/2016  . Acute CHF ([place]) [I50.9] 02/22/2016  . CHF (congestive heart failure) ([place]) [I50.9] 02/22/2016  . Hypertension [I10] 02/22/2016  . Suicide attempt ([place]) [T14.91]   . MDD (major depressive disorder), recurrent, severe, with psychosis ([place]) [F33.3] 02/17/2016  . Cannabis use disorder, severe, dependence ([place]) [F12.20] 02/17/2016  . Essential hypertension [I10] 02/17/2016  . History of arteriovenous malformation (AVM) [Z86.79] 02/17/2016  . Morbid obesity ([place]) [E66.01] 02/17/2016  . 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 HTN (hypertension) [I10] 01/29/2016  . 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 Severe recurrent major depressive disorder with psychotic features ([place]) [F33.3] 01/28/2016  . Cannabis use disorder, moderate, dependence ([place]) [F12.20] 01/28/2016  . Alcohol use disorder, moderate, dependence ([place]) [F10.20] 01/28/2016  . Suicidal ideation [R45.851] 01/28/2016  . Tobacco use disorder [F17.200] 01/28/2016  . Generalized anxiety disorder [F41.1] 05/18/2013    Total Time spent with patient: 30 minutes  Subjective:   Timothy Jackson is a 45 y.o. male patient admitted with paranoia, suicide ideation and hypertension.  HPI:  Timothy Jackson is a 45 y.o.Male, seen, chart reviewed for psychiatric consultation and evaluation of depression and paranoid schizophrenia and recently admitted to [facility] psychiatric unit where he stayed about 2 weeks. Patient reportedly continued to feel difficult to trust people around him, emotionally unstable with the depression, anxiety and anger. Patient reported he needs help to go back to the behavioral [place] and probably a to go to [place] for long-term care. Case discussed with Dr. [name] who reported that he will be medically stable soon and he needed transfer to the psychiatric facility may be a day or 2. Patient requested to contact his psychiatrist at [facility] Dr. Mamie Nick who knows him very well and feels well supported. Patient reported he has been isolated and withdrawn almost 3 years and he is on house and finally has decided to seek help about few weeks ago. Reportedly patient has been noncompliant with his medication management as per the medical records. Patient appeared with mood swings, tearfulness and paranoid thoughts during this visit. Past Psychiatric History: Patient has been diagnosed with major depressive disorder recurrent and paranoia.  Interval history: Patient seen for psychiatric consultation and follow-up evaluation. Patient appeared sitting in a chair next to the bed and talking with the safety sitter without having any difficulties. Patient appeared awake, alert and oriented to time place person and situation. Patient reported he need to speak with the psychiatrist regularly and also confidentially something happened more than a year ago. Patient is also complains about no blood is taking him serious and not helping him incident of being hospitalized at [facility] for 2 weeks and also one week in behavioral [place] within the last one month time. Patient refused to go to [facility] in [place] because he does not want to miss his 4 years old son activities in the community while in the [facility], patient readmitted himself to [facility] long emergency department next day reporting panic episodes.  Patient  denies current suicidal/homicidal ideation, intention or plans. Patient believes he can go back to behavioral [facility] and been placed on waiting list for [facility]. Spoke with the behavioral health administrative coordinator who reported patient is not on waiting list for [facility]. Dr. [name] reported patient needs monitoring for creatinine level and high blood pressure and may be medically cleared soon.   Risk to Self: Is patient at risk for suicide?: Yes Risk to Others:   Prior Inpatient Therapy:   Prior Outpatient Therapy:    Past Medical History:  Past Medical History  Diagnosis Date  . Paranoid ([place])   . AVM (arteriovenous malformation)   . Suicide attempt ([place])   . Hypertension   . Deaf     Right ear  . History of cardiac cath 02/24/2010     LVH, EF of 50-55%, LAD has 10-15% proximal stenosis, by Dr. Terrence Dupont  . Anxiety, generalized   . Erectile dysfunction   . Delusional disorder ([place])   . 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 Cardiac catheterization  01/2010    "essentially negative"  . Fracture surgery Left     ORIF left arm as a child   Family History:  Family History  Problem Relation Age of [place]  . Cancer Mother   . Bipolar disorder Maternal Aunt   . Bipolar disorder Maternal Aunt   . Hypertension Father   . Coronary artery disease Maternal Grandmother   . Hypertension Maternal Grandmother   . Diabetes Maternal Grandmother   . Coronary artery disease Maternal Uncle   . Hypertension Maternal Uncle   . Diabetes Maternal Aunt   . Leukemia Maternal Uncle    Family Psychiatric  History: Unknown Social History:  History  Alcohol Use  . 0.0 oz/week    Comment: daily     History  Drug Use  . Yes  . Special: Marijuana    Comment: Occassional marijuana use    Social History   Social History  . Marital Status: Single    Spouse Name: N/A  . Number of Children: N/A  . Years of Education:  N/A   Occupational History  . desk job     Sports administrator   Social History Main Topics  . Smoking status: Current Some Day Smoker    Types: Cigarettes  . Smokeless tobacco: None  . Alcohol Use: 0.0 oz/week     Comment: daily  . Drug Use: Yes    Special: Marijuana     Comment: Occassional marijuana use  . Sexual Activity: Not Currently   Other Topics Concern  . None   Social History Narrative   ** Merged History Encounter **       Additional Social History:    Allergies:  No Known Allergies  Labs:  Results for orders placed or performed during the hospital encounter of 02/22/16 (from the past 48 hour(s))  Troponin I (q 6hr x 3)     Status: None   Collection Time: 02/22/16  7:34 PM  Result Value Ref Range   Troponin I <0.03 <0.031 ng/mL    Comment:        NO INDICATION OF MYOCARDIAL INJURY.   Basic metabolic panel     Status: Abnormal   Collection Time: 02/23/16  1:07 AM  Result Value Ref Range   Sodium 140 135 - 145 mmol/L   Potassium 3.7 3.5 - 5.1 mmol/L   Chloride 103 101 -  111 mmol/L   CO2 30 22 - 32 mmol/L   Glucose, Bld 118 (H) 65 - 99 mg/dL   BUN 15 6 - 20 mg/dL   Creatinine, Ser 1.38 (H) 0.61 - 1.24 mg/dL   Calcium 9.0 8.9 - 10.3 mg/dL   GFR calc non Af Amer 60 (L) >60 mL/min   GFR calc Af Amer >60 >60 mL/min    Comment: (NOTE) The eGFR has been calculated using the CKD EPI equation. This calculation has not been validated in all clinical situations. eGFR's persistently <60 mL/min signify possible Chronic Kidney Disease.    Anion gap 7 5 - 15  Troponin I (q 6hr x 3)     Status: None   Collection Time: 02/23/16  1:07 AM  Result Value Ref Range   Troponin I <0.03 <0.031 ng/mL    Comment:        NO INDICATION OF MYOCARDIAL INJURY.   Troponin I (q 6hr x 3)     Status: None   Collection Time: 02/23/16  6:47 AM  Result Value Ref Range   Troponin I <0.03 <0.031 ng/mL    Comment:        NO INDICATION OF MYOCARDIAL INJURY.   Brain natriuretic  peptide     Status: None   Collection Time: 02/23/16  6:52 AM  Result Value Ref Range   B Natriuretic Peptide 82.0 0.0 - 100.0 pg/mL  Blood gas, arterial     Status: Abnormal   Collection Time: 02/23/16  8:48 AM  Result Value Ref Range   FIO2 0.21    pH, Arterial 7.389 7.350 - 7.450   pCO2 arterial 51.0 (H) 35.0 - 45.0 mmHg   pO2, Arterial 70.2 (L) 80.0 - 100.0 mmHg   Bicarbonate 30.1 (H) 20.0 - 24.0 mEq/L   TCO2 27.0 0 - 100 mmol/L   Acid-Base Excess 4.6 (H) 0.0 - 2.0 mmol/L   O2 Saturation 92.6 %   Patient temperature 98.6    Collection site RIGHT RADIAL    Drawn by COLLECTED BY RT    Sample type ARTERIAL DRAW    Allens test (pass/fail) PASS PASS  Basic metabolic panel     Status: Abnormal   Collection Time: 02/24/16  4:28 AM  Result Value Ref Range   Sodium 141 135 - 145 mmol/L   Potassium 4.3 3.5 - 5.1 mmol/L   Chloride 102 101 - 111 mmol/L   CO2 32 22 - 32 mmol/L   Glucose, Bld 114 (H) 65 - 99 mg/dL   BUN 18 6 - 20 mg/dL   Creatinine, Ser 1.59 (H) 0.61 - 1.24 mg/dL   Calcium 9.0 8.9 - 10.3 mg/dL   GFR calc non Af Amer 50 (L) >60 mL/min   GFR calc Af Amer 58 (L) >60 mL/min    Comment: (NOTE) The eGFR has been calculated using the CKD EPI equation. This calculation has not been validated in all clinical situations. eGFR's persistently <60 mL/min signify possible Chronic Kidney Disease.    Anion gap 7 5 - 15  CBC     Status: Abnormal   Collection Time: 02/24/16  4:28 AM  Result Value Ref Range   WBC 5.2 4.0 - 10.5 K/uL   RBC 4.82 4.22 - 5.81 MIL/uL   Hemoglobin 12.7 (L) 13.0 - 17.0 g/dL   HCT 39.6 39.0 - 52.0 %   MCV 82.2 78.0 - 100.0 fL   MCH 26.3 26.0 - 34.0 pg   MCHC 32.1 30.0 - 36.0 g/dL  RDW 13.9 11.5 - 15.5 %   Platelets 266 150 - 400 K/uL  Magnesium     Status: None   Collection Time: 02/24/16  4:28 AM  Result Value Ref Range   Magnesium 2.1 1.7 - 2.4 mg/dL    Current Facility-Administered Medications  Medication Dose Route Frequency Provider Last  Rate Last Dose  . acetaminophen (TYLENOL) tablet 650 mg  650 mg Oral Q4H PRN Lily Kocher, MD      . aspirin EC tablet 81 mg  81 mg Oral Q breakfast Lily Kocher, MD   81 mg at 02/24/16 0805  . heparin injection 5,000 Units  5,000 Units Subcutaneous Q8H Thurnell Lose, MD   5,000 Units at 02/23/16 1050  . hydrALAZINE (APRESOLINE) injection 10 mg  10 mg Intravenous Q6H PRN Thurnell Lose, MD      . hydrALAZINE (APRESOLINE) tablet 100 mg  100 mg Oral Q8H Lily Kocher, MD   100 mg at 02/24/16 1610  . hydrOXYzine (ATARAX/VISTARIL) tablet 50 mg  50 mg Oral TID PRN Lily Kocher, MD   50 mg at 02/23/16 1025  . isosorbide mononitrate (IMDUR) 24 hr tablet 120 mg  120 mg Oral Q breakfast Lily Kocher, MD   120 mg at 02/24/16 0805  . labetalol (NORMODYNE,TRANDATE) injection 20 mg  20 mg Intravenous Q4H PRN Lily Kocher, MD      . metoprolol (LOPRESSOR) tablet 100 mg  100 mg Oral BID AC & HS Lily Kocher, MD   100 mg at 02/24/16 0813  . ondansetron (ZOFRAN) injection 4 mg  4 mg Intravenous Q6H PRN Lily Kocher, MD      . paliperidone (INVEGA) 24 hr tablet 3 mg  3 mg Oral Daily Lily Kocher, MD   3 mg at 02/24/16 1014  . potassium chloride SA (K-DUR,KLOR-CON) CR tablet 20 mEq  20 mEq Oral BID Lily Kocher, MD   20 mEq at 02/24/16 1014  . prazosin (MINIPRESS) capsule 2 mg  2 mg Oral BID Lily Kocher, MD   2 mg at 02/24/16 1014  . traZODone (DESYREL) tablet 150 mg  150 mg Oral QHS Lily Kocher, MD   150 mg at 02/23/16 2208  . ziprasidone (GEODON) capsule 80 mg  80 mg Oral BID AC Lily Kocher, MD   80 mg at 02/23/16 1349    Musculoskeletal: Strength & Muscle Tone: decreased Gait & Station: unable to stand Patient leans: N/A  Psychiatric Specialty Exam: Physical Exam  ROS  Blood pressure 154/81, pulse 73, temperature 97.8 F (36.6 C), temperature source Axillary, resp. rate 18, height 5' 9"  (1.753 m), weight 180.078 kg (397 lb), SpO2 98 %.Body mass index is 58.6 kg/(m^2).  General Appearance: Guarded   Eye Contact:  Good  Speech:  Clear and Coherent and Slow  Volume:  Decreased  Mood:  Anxious and Depressed  Affect:  Depressed and Tearful  Thought Process:  Coherent and Goal Directed  Orientation:  Full (Time, Place, and Person)  Thought Content:  Paranoid Ideation  Suicidal Thoughts:  Yes.  without intent/plan  Homicidal Thoughts:  No  Memory:  Immediate;   Fair Recent;   Fair  Judgement:  Fair  Insight:  Fair  Psychomotor Activity:  Decreased  Concentration:  Concentration: Fair  Recall:  [place] of Knowledge:  Good  Language:  Good  Akathisia:  Negative  Handed:  Right  AIMS (if indicated):     Assets:  Communication Skills Desire for Improvement Housing Leisure Time Resilience Social Support  Transportation  ADL's:  Impaired  Cognition:  WNL  Sleep:        Treatment Plan Summary: Daily contact with patient to assess and evaluate symptoms and progress in treatment and Medication management   Continue safety sitter as patient continued to show symptoms of emotional instability and paranoid thoughts  Continue Invega 3 mg daily for paranoid ideations, Minipress 2 mg twice daily for PTSD and trazodone 150 mg at bedtime for insomnia, Geodon 80 mg twice daily for schizophrenia  Patient stated that he wanted to be transferred to the behavioral [place] where he has been received inpatient psychiatric treatment for several days after self-inflicted injury to his both forearms while in the [facility] emergency department when he was discharged from the [facility].  Patient requested to be placed in state psychiatric facility, [facility] even though he has been recommended to follow up with community psychiatric services with the psychiatrist and counseling services in [place]  Disposition: Recommend psychiatric Inpatient admission when medically cleared. Supportive therapy provided about ongoing stressors.  Ambrose Finland, MD 02/24/2016 11:55 AM

## 2016-02-24 NOTE — Plan of Care (Signed)
Problem: Safety: Goal: Ability to remain free from injury will improve Outcome: Progressing Sitter remains at bedside.  Suicide precautions in place.  Continue.    Problem: Tissue Perfusion: Goal: Risk factors for ineffective tissue perfusion will decrease BP 154/81 mmHg  Pulse 73  Temp(Src) 97.8 F (36.6 C) (Axillary)  Resp 18  Ht 5\' 9"  (1.753 m)  Wt 180.078 kg (397 lb)  BMI 58.60 kg/m2  SpO2 98%.  PRN BP meds ordered for SBP >150.  Scheduled Hydralazine, metotprolol.       Problem: Nutrition: Goal: Adequate nutrition will be maintained Outcome: Progressing Good appetite today.

## 2016-02-24 NOTE — Progress Notes (Signed)
LCSW following for psych disposition for inpatient admission. Reviewed chart, patient is not medically stable. Patient in voluntary status and confirmed with BH.    [facility] and [facility] as they are aware he is being [facility] once medically stable.  [facility], patient not medically stable.  On 5/31 will discuss plan with MD and if acute medical issues have resolved with [facility] and [facility] status.   [facility]  [facility] and [place] [facility]  Will continue to follow while in hospital.  Deretha EmoryHannah Greydis Stlouis LCSW, MSW Clinical Social Work: System [employer] [phone]

## 2016-02-25 DIAGNOSIS — F333 Major depressive disorder, recurrent, severe with psychotic symptoms: Secondary | ICD-10-CM

## 2016-02-25 LAB — BASIC METABOLIC PANEL
Anion gap: 6 (ref 5–15)
BUN: 19 mg/dL (ref 6–20)
CO2: 31 mmol/L (ref 22–32)
Calcium: 9 mg/dL (ref 8.9–10.3)
Chloride: 103 mmol/L (ref 101–111)
Creatinine, Ser: 1.48 mg/dL — ABNORMAL HIGH (ref 0.61–1.24)
GFR calc Af Amer: >60 mL/min (ref >60)
GFR, EST NON AFRICAN AMERICAN: 55 mL/min — AB (ref >60)
GLUCOSE: 115 mg/dL — AB (ref 65–99)
POTASSIUM: 4.1 mmol/L (ref 3.5–5.1)
Sodium: 140 mmol/L (ref 135–145)

## 2016-02-25 NOTE — Discharge Summary (Signed)
Timothy Jackson, is a 45 y.o. male  DOB [birth year]  MRN [id number].  Admission date:  02/22/2016  Admitting Physician  Michael LitterNikki Carter, MD  Discharge Date:  02/25/2016   Primary MD  No PCP Per Patient  Recommendations for primary care physician for things to follow:   Monitor blood pressure closely, needs outpatient renal follow-up within 1-2 weeks.  1.5 L fluid restriction and 2 g a day sodium restriction.   Admission Diagnosis  SHORTNESS OF BREATH SUICIDAL   Discharge Diagnosis  SHORTNESS OF BREATH SUICIDAL     Principal Problem:   Severe recurrent major depressive disorder with psychotic features (HCC) Active Problems:   Generalized anxiety disorder   Cannabis use disorder, moderate, dependence (HCC)   Suicidal ideation   AVM (arteriovenous malformation) brain   Accelerated hypertension   SOB (shortness of breath)   Acute CHF (HCC)   CHF (congestive heart failure) (HCC)      Past Medical History  Diagnosis Date  . Paranoid (HCC)   . AVM (arteriovenous malformation)   . Suicide attempt (HCC)   . Hypertension   . Deaf     Right ear  . History of cardiac cath 02/24/2010     LVH, EF of 50-55%, LAD has 10-15% proximal stenosis, by Dr. Sharyn LullHarwani  . Anxiety, generalized   . Erectile dysfunction   . Delusional disorder (HCC)   . 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 Cardiac catheterization  01/2010    "essentially negative"  . Fracture surgery Left     ORIF left arm as a child       HPI  from the history and physical done on the day of admission:   Timothy Jackson is a 45 y.o. 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 Patient reports escalating symptoms for three weeks. No chest pain. No syncope. He has leg swelling, which has been intermittent in the past but now seems to be persistent. No prior history of CAD.  ED Course: Chest xray concerning for encephalization and cardiomegaly without overt pulmonary edema.BNP mildy elevated. BP elevated 170's/100's and has been intermittently IV labetalol. Lasix 20mg  IV has been given one time. Hospitalist asked to admit.      Hospital Course:     1. Acute on chronic diastolic CHF EF 60%. He is much improved after diuresis with Lasix and blood pressure control. Likely went into CHF due to hypertensive urgency/crisis. Continue Salt and fluid restriction. Daily weights, monitor intake and output. He is now symptom free without shortness of breath, no oxygen demand, will be discharged on below dictated blood pressure medications and low-dose Lasix. Heart  healthy diet with 2 g daily sodium restriction and 1.5 L daily total fluid restriction.  2. Hypertensive urgency/crisis. Blood pressure medications have been adjusted, continue below dictated blood pressure medications, patient counseled on compliance.  3. 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 Baseline creatinine appears to be 1.4. Likely worse due to poorly controlled blood pressure and decompensated CHF, blood pressure has stabilized, stable renal ultrasound, renal function back to baseline and will be discharged on dictated blood pressure medications and low-dose Lasix, outpatient renal follow-up in 1-2 weeks recommended.  4. Paranoid schizophrenia with suicidal ideation. Sitter bedside. Patient was admitted at [facility], psychiatry consulted, creatinine better he will be discharged back to [facility] today.  5.  History of AVM. Blood pressure control.  6. Morbid obesity. Follow-up with PCP.    Follow UP  Follow-up Information    Follow up with [facility].   Why:  Call this number; follow the prompts to get a new primary care physician   Contact information:   [phone]      Follow up with PATEL,JAY K., MD. Schedule an appointment as soon as possible for a visit in 1 week.   Specialty:  Nephrology   Contact information:   [address] [place] [address] [phone]        Consults obtained - Psych  Discharge Condition: Stable  Diet and Activity recommendation: See Discharge Instructions below  Discharge Instructions           Discharge Instructions    Diet - low sodium heart healthy    Complete by:  As directed      Discharge instructions    Complete by:  As directed   Follow with Primary MD  in 7 days   Get CBC, CMP, 2 view Chest X ray checked  by Primary MD next visit.    Activity: As tolerated with Full fall precautions use walker/cane & assistance as needed   Disposition [facility]   Diet:   Heart Healthy  Check your Weight same time everyday, if you gain over 2 pounds, or you develop in leg swelling, experience more shortness of breath or chest pain, call your Primary MD immediately. Follow Cardiac Low Salt Diet and 1.5 lit/day fluid restriction.   On your next visit with your primary care physician please Get Medicines reviewed and adjusted.   Please request your Prim.MD to go over all Hospital Tests and Procedure/Radiological results at the follow up, please get all Hospital records sent to your Prim MD by signing hospital release before you go home.   If you experience worsening of your admission symptoms, develop shortness of breath, life threatening emergency, suicidal or homicidal thoughts you must seek medical attention immediately by calling 911 or calling your MD immediately  if symptoms less severe.  You Must read complete instructions/literature  along with all the possible adverse reactions/side effects for all the Medicines you take and that have been prescribed to you. Take any new Medicines after you have completely understood and accpet all the possible adverse reactions/side effects.   Do not drive, operate heavy machinery, perform activities at heights, swimming or participation in water activities or provide baby sitting services if your were admitted for syncope or siezures until you have seen by Primary MD or a Neurologist and advised to do so again.  Do not drive when taking Pain medications.    Do not take more than prescribed Pain, Sleep and Anxiety Medications  Special Instructions: If you have smoked or chewed Tobacco  in the last 2 yrs please stop smoking, stop any regular Alcohol  and or any Recreational drug use.  Wear Seat belts while driving.   Please note  You were cared for by a hospitalist during your hospital stay. If you have any questions about your discharge medications or the care you received while you were in the hospital after you are discharged, you can call the unit and asked to speak with the hospitalist on call if the hospitalist that took care of you is not available. Once you are discharged, your primary care physician will handle any further medical issues. Please note that NO REFILLS for any discharge medications will be authorized once you are discharged, as it is imperative that you return to your primary care physician (or establish a relationship with a primary care physician if you do not have one) for your aftercare needs so that they can reassess your need for medications and monitor your lab values.     Increase activity slowly    Complete by:  As directed              Discharge Medications       Medication List    STOP taking these medications        BC HEADACHE POWDER PO     hydrochlorothiazide 50 MG tablet  Commonly known as:  HYDRODIURIL     lisinopril 40 MG tablet    Commonly known as:  PRINIVIL,ZESTRIL     ziprasidone 80 MG capsule  Commonly known as:  GEODON      TAKE these medications        acetaminophen 650 MG CR tablet  Commonly known as:  TYLENOL  Take 650 mg by mouth every 6 (six) hours as needed for pain.     aluminum-magnesium hydroxide-simethicone 200-200-20 MG/5ML Susp  Commonly known as:  MAALOX  Take 30 mLs by mouth every 4 (four) hours as needed (indigestion).     aspirin 81 MG EC tablet  Take 1 tablet (81 mg total) by mouth daily with breakfast.     furosemide 20 MG tablet  Commonly known as:  LASIX  Take 1 tablet (20 mg total) by mouth daily.     hydrALAZINE 100 MG tablet  Commonly known as:  APRESOLINE  Take 1 tablet (100 mg total) by mouth every 8 (eight) hours.     hydrOXYzine 50 MG tablet  Commonly known as:  ATARAX/VISTARIL  Take 1 tablet (50 mg total) by mouth 3 (three) times daily as needed for anxiety.     isosorbide mononitrate 120 MG 24 hr tablet  Commonly known as:  IMDUR  Take 1 tablet (120 mg total) by mouth daily with breakfast.     metoprolol 100 MG tablet  Commonly known as:  LOPRESSOR  Take 1 tablet (100 mg total) by mouth 2 (two) times daily at 8 am and 10 pm.     paliperidone 3 MG 24 hr tablet  Commonly known as:  INVEGA  Take 3 mg by mouth daily.     prazosin 2 MG capsule  Commonly known as:  MINIPRESS  Take 2 mg by mouth 2 (two) times daily.     traZODone 150 MG tablet  Commonly known as:  DESYREL  Take 150 mg by mouth at bedtime.     traZODone 150 MG tablet  Commonly known as:  DESYREL  Take 1 tablet (150 mg total) by mouth at bedtime.        Major  procedures and Radiology Reports - PLEASE review detailed and final reports for all details, in brief -     TTE -   - Left ventricle: The cavity size was normal. There was moderate concentric hypertrophy. Systolic function was normal. The estimated ejection fraction was in the range of 60% to 65%. Wall motion was normal; there were  no regional wall motion abnormalities. Doppler parameters are consistent with abnormal left ventricular relaxation (grade 1 diastolic dysfunction). - Aortic valve: Trileaflet; normal thickness leaflets. There was no regurgitation. - Aortic root: The aortic root was normal in size. - Mitral valve: Structurally normal valve. - Right ventricle: The cavity size was normal. Wall thickness was normal. Systolic function was normal. - Tricuspid valve: There was mild regurgitation. - Pulmonary arteries: Systolic pressure was within the normal range. - Inferior vena cava: The vessel was normal in size.    Dg Chest 2 View  02/22/2016  CLINICAL DATA:  Shortness of breath.  Peripheral edema . EXAM: CHEST  2 VIEW COMPARISON:  None. FINDINGS: Mild enlargement of the cardiac silhouette. Otherwise normal mediastinal contour. No pneumothorax. No pleural effusion. Cephalization of the pulmonary vasculature without overt pulmonary edema. Curvilinear opacities at both lung bases. IMPRESSION: 1. Mild enlargement of the cardiac silhouette. Cephalization of the pulmonary vasculature without overt pulmonary edema. 2. Mild curvilinear bibasilar lung opacities, favor scarring or atelectasis. Electronically Signed   By: Delbert Phenix M.D.   On: 02/22/2016 12:44   US Renal  02/24/2016  CLINICAL DATA:  Acute renal failure EXAM: RENAL / URINARY TRACT ULTRASOUND COMPLETE COMPARISON:  None. FINDINGS: Right Kidney: Length: 12.6 cm. Echogenicity within normal limits. No mass or hydronephrosis visualized. Left Kidney: Length: 12.5 cm. Echogenicity within normal limits. No mass or hydronephrosis visualized. Bladder: Nondistended IMPRESSION: No acute abnormality noted. Electronically Signed   By: Alcide Clever M.D.   On: 02/24/2016 07:27   Dg Chest Port 1 View  02/23/2016  CLINICAL DATA:  Shortness of breath EXAM: PORTABLE CHEST 1 VIEW COMPARISON:  05/15/2013 chest radiograph. FINDINGS: Mild-to-moderate enlargement of the cardiac  silhouette, increased from the most recent chest radiograph of 05/15/2013. Otherwise stable mediastinal contour. No pneumothorax. Trace left pleural effusion. No right pleural effusion. Borderline mild pulmonary edema. IMPRESSION: 1. New enlargement of the cardiac silhouette, cannot exclude pericardial effusion. Correlate with echocardiography as clinically warranted. 2. Borderline mild pulmonary edema. 3. Trace left pleural effusion. Electronically Signed   By: Delbert Phenix M.D.   On: 02/23/2016 08:39    Micro Results      No results found for this or any previous visit (from the past 240 hour(s)).     Today   Subjective    Timothy Jackson today has no headache,no chest abdominal pain,no new weakness tingling or numbness, feels much better wants to go to [facility] today.    Objective   Blood pressure 140/83, pulse 62, temperature 97.8 F (36.6 C), temperature source Oral, resp. rate 18, height 5\' 9"  (1.753 m), weight 180.532 kg (398 lb), SpO2 96 %.   Intake/Output Summary (Last 24 hours) at 02/25/16 1102 Last data filed at 02/25/16 0759  Gross per 24 hour  Intake   1080 ml  Output      0 ml  Net   1080 ml    Exam Awake Alert, Oriented x 3, No new F.N deficits, Normal affect [place].AT,PERRAL Supple Neck,No JVD, No cervical lymphadenopathy appriciated.  Symmetrical Chest wall movement, Good air movement bilaterally, CTAB RRR,No Gallops,Rubs or new Murmurs, No Parasternal  Heave +ve B.Sounds, Abd Soft, Non tender, No organomegaly appriciated, No rebound -guarding or rigidity. No Cyanosis, Clubbing or edema, No new Rash or bruise   Data Review   CBC w Diff:  Lab Results  Component Value Date   WBC 5.2 02/24/2016   HGB 12.7* 02/24/2016   HCT 39.6 02/24/2016   PLT 266 02/24/2016   LYMPHOPCT 23 02/22/2016   MONOPCT 9 02/22/2016   EOSPCT 2 02/22/2016   BASOPCT 0 02/22/2016    CMP:  Lab Results  Component Value Date   NA 140 02/25/2016   K 4.1 02/25/2016   CL 103 02/25/2016     CO2 31 02/25/2016   BUN 19 02/25/2016   CREATININE 1.48* 02/25/2016   PROT 7.5 02/14/2016   ALBUMIN 4.4 02/14/2016   BILITOT 0.3 02/14/2016   ALKPHOS 74 02/14/2016   AST 24 02/14/2016   ALT 33 02/14/2016  .   Total Time in preparing paper work, data evaluation and todays exam - 35 minutes  Leroy Sea M.D on 02/25/2016 at 11:02 AM  [facility]   Office  [phone]

## 2016-02-25 NOTE — Care Management Important Message (Signed)
Important Message  Patient Details  Name: Timothy Jackson MRN: [id number] Date of Birth: 1971/09/26   Medicare Important Message Given:  Yes    Haskell FlirtJamison, Nichole Keltner 02/25/2016, 10:14 AMImportant Message  Patient Details  Name: Timothy Jackson MRN: [id number] Date of Birth: 1971/09/26   Medicare Important Message Given:  Yes    Haskell FlirtJamison, Giovanni Biby 02/25/2016, 10:14 AM

## 2016-02-25 NOTE — Progress Notes (Signed)
Pt still has orders for tele, pt states "they took that off this morning, please don't put it back on." On-call paged, no new orders given. Educated pt and still refuses. Will keep tele off and continue to monitor.

## 2016-02-25 NOTE — Discharge Instructions (Signed)
Follow with Primary MD  in 7 days   Get CBC, CMP, 2 view Chest X ray checked  by Primary MD next visit.    Activity: As tolerated with Full fall precautions use walker/cane & assistance as needed   Disposition [facility]   Diet:   Heart Healthy  Check your Weight same time everyday, if you gain over 2 pounds, or you develop in leg swelling, experience more shortness of breath or chest pain, call your Primary MD immediately. Follow Cardiac Low Salt Diet and 1.5 lit/day fluid restriction.   On your next visit with your primary care physician please Get Medicines reviewed and adjusted.   Please request your Prim.MD to go over all Hospital Tests and Procedure/Radiological results at the follow up, please get all Hospital records sent to your Prim MD by signing hospital release before you go home.   If you experience worsening of your admission symptoms, develop shortness of breath, life threatening emergency, suicidal or homicidal thoughts you must seek medical attention immediately by calling 911 or calling your MD immediately  if symptoms less severe.  You Must read complete instructions/literature along with all the possible adverse reactions/side effects for all the Medicines you take and that have been prescribed to you. Take any new Medicines after you have completely understood and accpet all the possible adverse reactions/side effects.   Do not drive, operate heavy machinery, perform activities at heights, swimming or participation in water activities or provide baby sitting services if your were admitted for syncope or siezures until you have seen by Primary MD or a Neurologist and advised to do so again.  Do not drive when taking Pain medications.    Do not take more than prescribed Pain, Sleep and Anxiety Medications  Special Instructions: If you have smoked or chewed Tobacco  in the last 2 yrs please stop smoking, stop any regular Alcohol  and or any Recreational drug use.  Wear  Seat belts while driving.   Please note  You were cared for by a hospitalist during your hospital stay. If you have any questions about your discharge medications or the care you received while you were in the hospital after you are discharged, you can call the unit and asked to speak with the hospitalist on call if the hospitalist that took care of you is not available. Once you are discharged, your primary care physician will handle any further medical issues. Please note that NO REFILLS for any discharge medications will be authorized once you are discharged, as it is imperative that you return to your primary care physician (or establish a relationship with a primary care physician if you do not have one) for your aftercare needs so that they can reassess your need for medications and monitor your lab values.

## 2016-02-26 NOTE — Progress Notes (Signed)
Patient alert and orient ted, cooperative.Discharge to [facility], report given to Dereck LeepBernadette Nario, RN

## 2016-02-26 NOTE — Progress Notes (Signed)
Pt requested his personal belongings, looked in both cabinets ay nurses station and no belongings found, none documented on admission, made pt aware and he stated that he knew [facility] were not [facility], instructed him to contact family member or someone at receiving facility to obtain his belongings, verbalized understanding

## 2016-02-26 NOTE — Consult Note (Signed)
[name] Consult follow-up  Reason for Consult:  Paranoid schizophrenia and recent admit to [facility] Referring Physician:  Dr. [name] Patient Identification: Timothy Jackson MRN:  [id number] Principal Diagnosis: Severe recurrent major depressive disorder with psychotic features [facility]) Diagnosis:   Patient Active Problem List   Diagnosis Date Noted  . Hypertension [I10] 02/22/2016  . Accelerated hypertension [I10] 02/22/2016  . SOB (shortness of breath) [R06.02] 02/22/2016  . Acute CHF ([place]) [I50.9] 02/22/2016  . CHF (congestive heart failure) ([place]) [I50.9] 02/22/2016  . Hypertension [I10] 02/22/2016  . Suicide attempt ([place]) [T14.91]   . MDD (major depressive disorder), recurrent, severe, with psychosis ([place]) [F33.3] 02/17/2016  . Cannabis use disorder, severe, dependence ([place]) [F12.20] 02/17/2016  . Essential hypertension [I10] 02/17/2016  . History of arteriovenous malformation (AVM) [Z86.79] 02/17/2016  . Morbid obesity ([place]) [E66.01] 02/17/2016  . 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 HTN (hypertension) [I10] 01/29/2016  . 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 Severe recurrent major depressive disorder with psychotic features ([place]) [F33.3] 01/28/2016  . Cannabis use disorder, moderate, dependence ([place]) [F12.20] 01/28/2016  . Alcohol use disorder, moderate, dependence ([place]) [F10.20] 01/28/2016  . Suicidal ideation [R45.851] 01/28/2016  . Tobacco use disorder [F17.200] 01/28/2016  . Generalized anxiety disorder [F41.1] 05/18/2013    Total Time spent with patient: 30 minutes  Subjective:   Timothy Jackson is a 45 y.o. male patient admitted with paranoia, suicide ideation and hypertension.  HPI:  Timothy Jackson is a 45 y.o.Male, seen, chart reviewed for psychiatric consultation and evaluation of depression and paranoid schizophrenia and recently admitted to [facility] psychiatric unit where he stayed about 2 weeks. Patient reportedly continued to feel difficult to trust people around him, emotionally unstable with the depression, anxiety and anger. Patient reported he needs help to go back to the behavioral [place] and probably a to go to [place] for long-term care. Case discussed with Dr. [name] who reported that he will be medically stable soon and he needed transfer to the psychiatric facility may be a day or 2. Patient requested to contact his psychiatrist at [facility] Dr. Mamie Nick who knows him very well and feels well supported. Patient reported he has been isolated and withdrawn almost 3 years and he is on house and finally has decided to seek help about few weeks ago. Reportedly patient has been noncompliant with his medication management as per the medical records. Patient appeared with mood swings, tearfulness and paranoid thoughts during this visit. Past Psychiatric History: Patient has been diagnosed with major depressive disorder recurrent and paranoia.  Interval history: Patient seen for psychiatric consultation and follow-up evaluation and spoke with psych LCSW regarding placement. Patient appeared sitting in a chair next to the bed and talking with the safety sitter. Patient stated that he continue to have paranoid delusional thought and feels people keep following him and scared about the people intentions. He has ben compliant with his medication therapies and able to communicate with staff without difficulties. patient states that he won't be able to survive in community and seeking placement in long term hospitalization. He is known to making suicide attempt or attention seeking behavior about a week ago at [facility] when psychiatrically and medically cleared which was changed to [facility] admission.   Patient hospitalized at [facility] for 2 weeks and also one week in [place] within the last one month. Patient refused to go  to  [facility] in [place] because he does not want to miss his 33 years old son activities in the community while in the [facility], patient readmitted himself to [facility] long emergency department next day reporting panic episodes. Patient believes he can go back to behavioral [facility] and been placed on waiting list for [place].   Risk to Self: Is patient at risk for suicide?: Yes Risk to Others:   Prior Inpatient Therapy:   Prior Outpatient Therapy:    Past Medical History:  Past Medical History  Diagnosis Date  . Paranoid ([place])   . AVM (arteriovenous malformation)   . Suicide attempt ([place])   . Hypertension   . Deaf     Right ear  . History of cardiac cath 02/24/2010     LVH, EF of 50-55%, LAD has 10-15% proximal stenosis, by Dr. Terrence Dupont  . Anxiety, generalized   . Erectile dysfunction   . Delusional disorder ([place])   . 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 Cardiac catheterization  01/2010    "essentially negative"  . Fracture surgery Left     ORIF left arm as a child   Family History:  Family History  Problem Relation Age of Onset  . Cancer Mother   . Bipolar disorder Maternal Aunt   . Bipolar disorder Maternal Aunt   . Hypertension Father   . Coronary artery disease Maternal Grandmother   . Hypertension Maternal Grandmother   . Diabetes Maternal Grandmother   . Coronary artery disease Maternal Uncle   . Hypertension Maternal Uncle   . Diabetes Maternal Aunt   . Leukemia Maternal Uncle    Family Psychiatric  History: Unknown Social History:  History  Alcohol Use  . 0.0 oz/week    Comment: daily     History  Drug Use  . Yes  . Special: Marijuana    Comment: Occassional marijuana use    Social History   Social History  . Marital Status: Single    Spouse Name: N/A  . Number of Children: N/A  . Years of Education: N/A   Occupational History  . desk job     Sports administrator   Social  History Main Topics  . Smoking status: Current Some Day Smoker    Types: Cigarettes  . Smokeless tobacco: None  . Alcohol Use: 0.0 oz/week     Comment: daily  . Drug Use: Yes    Special: Marijuana     Comment: Occassional marijuana use  . Sexual Activity: Not Currently   Other Topics Concern  . None   Social History Narrative   ** Merged History Encounter **       Additional Social History:    Allergies:  No Known Allergies  Labs:  Results for orders placed or performed during the hospital encounter of 02/22/16 (from the past 48 hour(s))  Basic metabolic panel     Status: Abnormal   Collection Time: 02/25/16  4:37 AM  Result Value Ref Range   Sodium 140 135 - 145 mmol/L   Potassium 4.1 3.5 - 5.1 mmol/L   Chloride 103 101 - 111 mmol/L   CO2 31 22 - 32 mmol/L   Glucose, Bld 115 (H) 65 - 99 mg/dL   BUN 19 6 - 20 mg/dL   Creatinine, Ser 1.48 (H) 0.61 - 1.24 mg/dL   Calcium 9.0 8.9 - 10.3 mg/dL   GFR calc non Af Amer 55 (L) >  60 mL/min   GFR calc Af Amer >60 >60 mL/min    Comment: (NOTE) The eGFR has been calculated using the CKD EPI equation. This calculation has not been validated in all clinical situations. eGFR's persistently <60 mL/min signify possible Chronic Kidney Disease.    Anion gap 6 5 - 15    Current Facility-Administered Medications  Medication Dose Route Frequency Provider Last Rate Last Dose  . acetaminophen (TYLENOL) tablet 650 mg  650 mg Oral Q4H PRN Lily Kocher, MD      . aspirin EC tablet 81 mg  81 mg Oral Q breakfast Lily Kocher, MD   81 mg at 02/26/16 0757  . heparin injection 5,000 Units  5,000 Units Subcutaneous Q8H Thurnell Lose, MD   5,000 Units at 02/26/16 0509  . hydrALAZINE (APRESOLINE) injection 10 mg  10 mg Intravenous Q6H PRN Thurnell Lose, MD      . hydrALAZINE (APRESOLINE) tablet 100 mg  100 mg Oral Q8H Lily Kocher, MD   100 mg at 02/26/16 0509  . hydrOXYzine (ATARAX/VISTARIL) tablet 50 mg  50 mg Oral TID PRN Lily Kocher, MD    50 mg at 02/23/16 1025  . isosorbide mononitrate (IMDUR) 24 hr tablet 120 mg  120 mg Oral Q breakfast Lily Kocher, MD   120 mg at 02/26/16 0758  . labetalol (NORMODYNE,TRANDATE) injection 20 mg  20 mg Intravenous Q4H PRN Lily Kocher, MD      . metoprolol (LOPRESSOR) tablet 100 mg  100 mg Oral BID AC & HS Lily Kocher, MD   100 mg at 02/26/16 0758  . ondansetron (ZOFRAN) injection 4 mg  4 mg Intravenous Q6H PRN Lily Kocher, MD      . paliperidone (INVEGA) 24 hr tablet 3 mg  3 mg Oral Daily Lily Kocher, MD   3 mg at 02/26/16 0928  . potassium chloride SA (K-DUR,KLOR-CON) CR tablet 20 mEq  20 mEq Oral BID Lily Kocher, MD   20 mEq at 02/26/16 0929  . prazosin (MINIPRESS) capsule 2 mg  2 mg Oral BID Lily Kocher, MD   2 mg at 02/26/16 1638  . traZODone (DESYREL) tablet 150 mg  150 mg Oral QHS Lily Kocher, MD   150 mg at 02/25/16 2221  . ziprasidone (GEODON) capsule 80 mg  80 mg Oral BID AC Lily Kocher, MD   80 mg at 02/25/16 1708    Musculoskeletal: Strength & Muscle Tone: decreased Gait & Station: unable to stand Patient leans: N/A  Psychiatric Specialty Exam: Physical Exam  ROS  Blood pressure 150/87, pulse 71, temperature 97.8 F (36.6 C), temperature source Oral, resp. rate 18, height 5' 9"  (1.753 m), weight 180.305 kg (397 lb 8 oz), SpO2 97 %.Body mass index is 58.67 kg/(m^2).  General Appearance: Guarded  Eye Contact:  Good  Speech:  Clear and Coherent and Slow  Volume:  Decreased  Mood:  Anxious and Depressed  Affect:  Depressed and Tearful  Thought Process:  Coherent and Goal Directed  Orientation:  Full (Time, Place, and Person)  Thought Content:  Paranoid Ideation  Suicidal Thoughts:  Yes.  without intent/plan  Homicidal Thoughts:  No  Memory:  Immediate;   Fair Recent;   Fair  Judgement:  Fair  Insight:  Fair  Psychomotor Activity:  Decreased  Concentration:  Concentration: Fair  Recall:  Collins of Knowledge:  Good  Language:  Good  Akathisia:  Negative   Handed:  Right  AIMS (if indicated):  Assets:  Communication Skills Desire for Improvement Housing Leisure Time Resilience Social Support Transportation  ADL's:  Impaired  Cognition:  WNL  Sleep:        Treatment Plan Summary: Case discussed with psych LCSW and Air cabin crew who finds patient seems to be paranoid and delusional and meets criteria for acute psych admission.  Continue Air cabin crew as patient continued to show symptoms of emotional instability and paranoid thoughts Invega 3 mg daily for paranoid ideations Minipress 2 mg twice daily for PTSD  Trazodone 150 mg at bedtime for insomnia Geodon 80 mg twice daily for schizophrenia Monitor for EPS  Patient stated that he can not contract for safety because he does not feel safety in community and willing to seek inpatient psychiatric treatment. He makes statement that he may do something to harm again if he does get the help he needs. Patient requested to be placed in state psychiatric facility - [facility] [place].  Disposition: Recommend psychiatric Inpatient admission when medically cleared. Supportive therapy provided about ongoing stressors.  Ambrose Finland, MD 02/26/2016 11:17 AM

## 2016-02-26 NOTE — Progress Notes (Addendum)
Patient has been accepted to [facility] for acute inpatient hospitalization. Patient accepted to Dr. Hardie PulleyKhol. Report:  [phone] Patient is voluntary status.   Patient will transport by [facility]. All clinicals sent to facility. No other needs.  Deretha EmoryHannah Lawyer Washabaugh LCSW, MSW Clinical Social Work: [employer] [phone]

## 2016-02-26 NOTE — Progress Notes (Signed)
PROGRESS NOTE                                                                                                                                                                                                             Patient Demographics:    Timothy Jackson, is a 45 y.o. male, DOB - 1971/04/02, JXB:[id number]  Admit date - 02/22/2016   Admitting Physician Michael LitterNikki Carter, MD  Outpatient Primary MD for the patient is No PCP Per Patient  LOS - 4  Chief Complaint  Patient presents with  . Shortness of Breath  . Suicidal       Brief Narrative     Timothy ColumbiaOtis W Jackson is a 45 y.o. 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 Patient reports escalating symptoms for three weeks. No chest pain. No syncope. He has leg swelling, which has been intermittent in the past but now seems to be persistent. No prior history of CAD.  ED Course: Chest xray concerning for encephalization and cardiomegaly without overt pulmonary edema.BNP mildy elevated. BP elevated 170's/100's and has been intermittently IV labetalol. Lasix 20mg  IV has been given one time. Hospitalist asked to admit.    Subjective:    Timothy NeedsOtis Rackers today has, No headache, No chest pain, No abdominal pain - No Nausea, No new weakness tingling or numbness, No Cough - Much improved shortness of breath.   Assessment  & Plan :    Patient has been discharged on 02/25/2016. No changes in discharge plan, await [facility] bed.   1. Acute on chronic diastolic CHF EF 60%. He is much improved after diuresis with Lasix and blood pressure control. Likely  went into CHF due to hypertensive urgency/crisis. Continue Salt and fluid restriction. Daily weights, monitor intake and output. Resolved after BP control and initial Lasix,   2. Hypertensive urgency/crisis. Blood pressure medications have been adjusted, into ARF will allow blood pressure to run slightly higher than what it is right now, hold Norvasc, add when necessary IV hydralazine, continue beta blocker, Imdur, hydralazine  along with prazosin.  3. ARF on CKG stage II. Baseline creatinine appears to be 1.2. Likely worse due to poorly controlled blood pressure and decompensated CHF, blood pressure has stabilized, hold off on Lasix as creatinine has bumped slightly, check renal ultrasound and repeat BMP in the morning.  4. Paranoid schizophrenia with suicidal ideation. Sitter bedside. Patient was admitted at [facility], psychiatry consulted, if creatinine better we'll discharge back to [facility] tomorrow.  5. History of AVM. Blood pressure control.  6. Morbid obesity. Follow-up with PCP.    Code Status :  Full  Family Communication  :  None present  Disposition Plan  :  [facility] discharge process done a weight bed availability  Consults  :  Psych  Procedures  :    TTE -   Left ventricle: The cavity size was normal. There was moderate  concentric hypertrophy. Systolic function was normal. The  estimated ejection fraction was in the range of 60% to 65%. Wall  motion was normal; there were no regional wall motion  abnormalities. Doppler parameters are consistent with abnormal  left ventricular relaxation (grade 1 diastolic dysfunction). - Aortic valve: Trileaflet; normal thickness leaflets. There was no  regurgitation. - Aortic root: The aortic root was normal in size. - Mitral valve: Structurally normal valve. - Right ventricle: The cavity size was normal. Wall thickness was  normal. Systolic function was normal. - Tricuspid valve: There was mild regurgitation. - Pulmonary arteries:  Systolic pressure was within the normal  range. - Inferior vena cava: The vessel was normal in size.  DVT Prophylaxis  :  Heparin   Lab Results  Component Value Date   PLT 266 02/24/2016    Inpatient Medications  Scheduled Meds: . aspirin EC  81 mg Oral Q breakfast  . heparin subcutaneous  5,000 Units Subcutaneous Q8H  . hydrALAZINE  100 mg Oral Q8H  . isosorbide mononitrate  120 mg Oral Q breakfast  . metoprolol tartrate  100 mg Oral BID AC & HS  . paliperidone  3 mg Oral Daily  . potassium chloride  20 mEq Oral BID  . prazosin  2 mg Oral BID  . traZODone  150 mg Oral QHS  . ziprasidone  80 mg Oral BID AC   Continuous Infusions:  PRN Meds:.acetaminophen, hydrALAZINE, hydrOXYzine, labetalol, ondansetron (ZOFRAN) IV  Antibiotics  :    Anti-infectives    None         Objective:   Filed Vitals:   02/25/16 2125 02/26/16 0455 02/26/16 0758 02/26/16 0928  BP: 127/77 159/89 165/98 150/87  Pulse: 56 64 71   Temp: 97.9 F (36.6 C) 97.8 F (36.6 C)    TempSrc: Oral Oral    Resp: 18 18    Height:      Weight:  180.305 kg (397 lb 8 oz)    SpO2: 97% 97%      Wt Readings from Last 3 Encounters:  02/26/16 180.305 kg (397 lb 8 oz)  02/22/16 184.614 kg (407 lb)  01/28/16 180.078 kg (397 lb)     Intake/Output Summary (Last 24 hours) at 02/26/16 0938 Last data filed at 02/26/16 0844  Gross per 24 hour  Intake   1080 ml  Output      0 ml  Net   1080 ml     Physical Exam  Awake Alert, Oriented X 3, No new F.N deficits, Anxious affect [place].AT,PERRAL Supple Neck,No JVD, No cervical lymphadenopathy appriciated.  Symmetrical Chest wall movement, Good air movement bilaterally,  CTAB RRR,No Gallops,Rubs or new Murmurs, No Parasternal Heave +ve B.Sounds, Abd Soft, No tenderness, No organomegaly appriciated, No rebound - guarding or rigidity. No Cyanosis, Clubbing or edema, No new Rash or bruise      Data Review:    CBC  Recent Labs Lab 02/22/16 1248  02/24/16 0428  WBC 5.3 5.2  HGB 12.0* 12.7*  HCT 36.7* 39.6  PLT 279 266  MCV 81.7 82.2  MCH 26.7 26.3  MCHC 32.7 32.1  RDW 13.7 13.9  LYMPHSABS 1.2  --   MONOABS 0.5  --   EOSABS 0.1  --   BASOSABS 0.0  --     Chemistries   Recent Labs Lab 02/22/16 1248 02/23/16 0107 02/24/16 0428 02/25/16 0437  NA 137 140 141 140  K 4.1 3.7 4.3 4.1  CL 103 103 102 103  CO2 29 30 32 31  GLUCOSE 105* 118* 114* 115*  BUN 13 15 18 19   CREATININE 1.31* 1.38* 1.59* 1.48*  CALCIUM 9.0 9.0 9.0 9.0  MG  --   --  2.1  --    ------------------------------------------------------------------------------------------------------------------ No results for input(s): CHOL, HDL, LDLCALC, TRIG, CHOLHDL, LDLDIRECT in the last 72 hours.  Lab Results  Component Value Date   HGBA1C 6.3* 01/29/2016   ------------------------------------------------------------------------------------------------------------------ No results for input(s): TSH, T4TOTAL, T3FREE, THYROIDAB in the last 72 hours.  Invalid input(s): FREET3 ------------------------------------------------------------------------------------------------------------------ No results for input(s): VITAMINB12, FOLATE, FERRITIN, TIBC, IRON, RETICCTPCT in the last 72 hours.  Coagulation profile No results for input(s): INR, PROTIME in the last 168 hours.  No results for input(s): DDIMER in the last 72 hours.  Cardiac Enzymes  Recent Labs Lab 02/22/16 1934 02/23/16 0107 02/23/16 0647  TROPONINI <0.03 <0.03 <0.03   ------------------------------------------------------------------------------------------------------------------    Component Value Date/Time   BNP 82.0 02/23/2016 1610    Micro Results No results found for this or any previous visit (from the past 240 hour(s)).  Radiology Reports Dg Chest 2 View  02/22/2016  CLINICAL DATA:  Shortness of breath.  Peripheral edema . EXAM: CHEST  2 VIEW COMPARISON:  None. FINDINGS: Mild  enlargement of the cardiac silhouette. Otherwise normal mediastinal contour. No pneumothorax. No pleural effusion. Cephalization of the pulmonary vasculature without overt pulmonary edema. Curvilinear opacities at both lung bases. IMPRESSION: 1. Mild enlargement of the cardiac silhouette. Cephalization of the pulmonary vasculature without overt pulmonary edema. 2. Mild curvilinear bibasilar lung opacities, favor scarring or atelectasis. Electronically Signed   By: Delbert Phenix M.D.   On: 02/22/2016 12:44   US Renal  02/24/2016  CLINICAL DATA:  Acute renal failure EXAM: RENAL / URINARY TRACT ULTRASOUND COMPLETE COMPARISON:  None. FINDINGS: Right Kidney: Length: 12.6 cm. Echogenicity within normal limits. No mass or hydronephrosis visualized. Left Kidney: Length: 12.5 cm. Echogenicity within normal limits. No mass or hydronephrosis visualized. Bladder: Nondistended IMPRESSION: No acute abnormality noted. Electronically Signed   By: Alcide Clever M.D.   On: 02/24/2016 07:27   Dg Chest Port 1 View  02/23/2016  CLINICAL DATA:  Shortness of breath EXAM: PORTABLE CHEST 1 VIEW COMPARISON:  05/15/2013 chest radiograph. FINDINGS: Mild-to-moderate enlargement of the cardiac silhouette, increased from the most recent chest radiograph of 05/15/2013. Otherwise stable mediastinal contour. No pneumothorax. Trace left pleural effusion. No right pleural effusion. Borderline mild pulmonary edema. IMPRESSION: 1. New enlargement of the cardiac silhouette, cannot exclude pericardial effusion. Correlate with echocardiography as clinically warranted. 2. Borderline mild pulmonary edema. 3. Trace left pleural effusion. Electronically Signed   By: Delbert Phenix  M.D.   On: 02/23/2016 08:39    Time Spent in minutes  30   Susa Raring K M.D on 02/26/2016 at 9:38 AM  Between 7am to 7pm - Pager - [phone]  After 7pm go to [email or web address] - password [facility]  [facility] -  Office  [phone]

## 2016-02-26 NOTE — Progress Notes (Signed)
Disposition: Recommend psychiatric Inpatient admission. LCSWA made BH referal for inpatient psych: [facility]: Denied. [facility]. [facility] P[facility]. [facility]. [facility]: Pending.  Vivi BarrackNicole Summerlynn Glauser, Theresia MajorsLCSWA, MSW Clinical Social Worker 5E and Psychiatric Service Line [phone]  02/26/2016  11:00 AM

## 2016-02-26 NOTE — BH Assessment (Signed)
Patient accepted to inpatient facility, [facility] transport picked up patient belongings [facility] contents [facility] #44 including cell phone, ID card, EBT card, mastercard, social security card x2, bus pass, Engineering geologistlibrary card, brown coat, lighter, sheetz card, various papers, bible, black suitcase with belongings, brown paper bag with clothes from patient room.

## 2016-03-04 ENCOUNTER — Encounter (HOSPITAL_COMMUNITY): Payer: Self-pay | Admitting: Emergency Medicine

## 2016-03-04 ENCOUNTER — Emergency Department (HOSPITAL_COMMUNITY)
Admission: EM | Admit: 2016-03-04 | Discharge: 2016-03-06 | Disposition: A | Discharge status: Home / Self Care | Payer: Medicare Other | Attending: Emergency Medicine | Admitting: Emergency Medicine

## 2016-03-04 DIAGNOSIS — Z79899 Other long term (current) drug therapy: Secondary | ICD-10-CM | POA: Insufficient documentation

## 2016-03-04 DIAGNOSIS — F323 Major depressive disorder, single episode, severe with psychotic features: Secondary | ICD-10-CM | POA: Diagnosis not present

## 2016-03-04 DIAGNOSIS — R45851 Suicidal ideations: Secondary | ICD-10-CM

## 2016-03-04 DIAGNOSIS — I10 Essential (primary) hypertension: Secondary | ICD-10-CM | POA: Diagnosis not present

## 2016-03-04 DIAGNOSIS — F333 Major depressive disorder, recurrent, severe with psychotic symptoms: Secondary | ICD-10-CM | POA: Diagnosis present

## 2016-03-04 DIAGNOSIS — F17210 Nicotine dependence, cigarettes, uncomplicated: Secondary | ICD-10-CM | POA: Insufficient documentation

## 2016-03-04 DIAGNOSIS — Z7982 Long term (current) use of aspirin: Secondary | ICD-10-CM | POA: Diagnosis not present

## 2016-03-04 LAB — COMPREHENSIVE METABOLIC PANEL
ALBUMIN: 4.1 g/dL (ref 3.5–5.0)
ALK PHOS: 57 U/L (ref 38–126)
ALT: 29 U/L (ref 17–63)
ANION GAP: 7 (ref 5–15)
AST: 18 U/L (ref 15–41)
BILIRUBIN TOTAL: 0.6 mg/dL (ref 0.3–1.2)
BUN: 15 mg/dL (ref 6–20)
CALCIUM: 9.3 mg/dL (ref 8.9–10.3)
CO2: 30 mmol/L (ref 22–32)
Chloride: 102 mmol/L (ref 101–111)
Creatinine, Ser: 1.33 mg/dL — ABNORMAL HIGH (ref 0.61–1.24)
GFR calc Af Amer: >60 mL/min (ref >60)
GLUCOSE: 104 mg/dL — AB (ref 65–99)
Potassium: 3.9 mmol/L (ref 3.5–5.1)
Sodium: 139 mmol/L (ref 135–145)
TOTAL PROTEIN: 7.3 g/dL (ref 6.5–8.1)

## 2016-03-04 LAB — CBC
HEMATOCRIT: 37.9 % — AB (ref 39.0–52.0)
Hemoglobin: 12.3 g/dL — ABNORMAL LOW (ref 13.0–17.0)
MCH: 26.3 pg (ref 26.0–34.0)
MCHC: 32.5 g/dL (ref 30.0–36.0)
MCV: 81.2 fL (ref 78.0–100.0)
Platelets: 268 10*3/uL (ref 150–400)
RBC: 4.67 MIL/uL (ref 4.22–5.81)
RDW: 14 % (ref 11.5–15.5)
WBC: 4.7 10*3/uL (ref 4.0–10.5)

## 2016-03-04 LAB — ACETAMINOPHEN LEVEL

## 2016-03-04 LAB — ETHANOL

## 2016-03-04 LAB — SALICYLATE LEVEL: Salicylate Lvl: <4 mg/dL (ref 2.8–30.0)

## 2016-03-04 MED ORDER — PALIPERIDONE ER 3 MG PO TB24
3.0000 mg | ORAL_TABLET | Freq: Every day | ORAL | Status: DC
  Administered 2016-03-05 – 2016-03-06 (×2): 3 mg via ORAL
  Filled 2016-03-04 (×2): qty 1

## 2016-03-04 MED ORDER — ZIPRASIDONE HCL 20 MG PO CAPS: 80.0000 mg | ORAL_CAPSULE | Freq: Every day | ORAL | Status: DC

## 2016-03-04 MED ORDER — ISOSORBIDE MONONITRATE ER 60 MG PO TB24
120.0000 mg | ORAL_TABLET | Freq: Every day | ORAL | Status: DC
  Administered 2016-03-05 – 2016-03-06 (×2): 120 mg via ORAL
  Filled 2016-03-04 (×3): qty 2

## 2016-03-04 MED ORDER — PRAZOSIN HCL 2 MG PO CAPS
2.0000 mg | ORAL_CAPSULE | Freq: Two times a day (BID) | ORAL | Status: DC
  Administered 2016-03-04 – 2016-03-06 (×4): 2 mg via ORAL
  Filled 2016-03-04 (×5): qty 1

## 2016-03-04 MED ORDER — FUROSEMIDE 20 MG PO TABS
20.0000 mg | ORAL_TABLET | Freq: Every day | ORAL | Status: DC
  Administered 2016-03-05 – 2016-03-06 (×2): 20 mg via ORAL
  Filled 2016-03-04 (×2): qty 1

## 2016-03-04 MED ORDER — HYDRALAZINE HCL 50 MG PO TABS
100.0000 mg | ORAL_TABLET | Freq: Three times a day (TID) | ORAL | Status: DC
  Administered 2016-03-04 – 2016-03-06 (×5): 100 mg via ORAL
  Filled 2016-03-04 (×8): qty 2

## 2016-03-04 MED ORDER — LISINOPRIL 40 MG PO TABS: 40.0000 mg | ORAL_TABLET | Freq: Every day | ORAL | Status: DC

## 2016-03-04 MED ORDER — TRAZODONE HCL 50 MG PO TABS
150.0000 mg | ORAL_TABLET | Freq: Every day | ORAL | Status: DC
  Administered 2016-03-04 – 2016-03-05 (×2): 150 mg via ORAL
  Filled 2016-03-04 (×2): qty 1

## 2016-03-04 MED ORDER — PALIPERIDONE ER 6 MG PO TB24: 6.0000 mg | ORAL_TABLET | Freq: Every day | ORAL | Status: DC

## 2016-03-04 MED ORDER — ASPIRIN EC 81 MG PO TBEC
81.0000 mg | DELAYED_RELEASE_TABLET | Freq: Every day | ORAL | Status: DC
  Administered 2016-03-05 – 2016-03-06 (×2): 81 mg via ORAL
  Filled 2016-03-04 (×3): qty 1

## 2016-03-04 MED ORDER — HYDROXYZINE HCL 25 MG PO TABS
50.0000 mg | ORAL_TABLET | Freq: Three times a day (TID) | ORAL | Status: DC | PRN
  Administered 2016-03-04: 50 mg via ORAL
  Filled 2016-03-04: qty 2

## 2016-03-04 MED ORDER — METOPROLOL TARTRATE 25 MG PO TABS
100.0000 mg | ORAL_TABLET | Freq: Two times a day (BID) | ORAL | Status: DC
  Administered 2016-03-04 – 2016-03-06 (×4): 100 mg via ORAL
  Filled 2016-03-04 (×4): qty 4

## 2016-03-04 NOTE — BH Assessment (Signed)
[facility] at capacity. Faxed clinical information to the following facilities for placement:  [facility]   [address] Ellis Patsy BaltimoreWarrick Jr, [facility], [facility], [facility] Triage Specialist [phone]

## 2016-03-04 NOTE — ED Notes (Signed)
Pt reports he has felt anxious and depressed this afternoon after he heard his neighbor yelling. Pt having thoughts of cutting his wrists. No HI.

## 2016-03-04 NOTE — BH Assessment (Addendum)
Tele Assessment Note   Timothy Jackson is an 45 y.o. male who presents unaccompanied to [facility] ED reporting suicidal ideation. Pt has a diagnosis of schizophrenia and was discharged from [facility] earlier today after approximately one week inpatient treatment. Pt says he told staff at [facility] he was still suicidal and that he wasn't ready for discharge. Pt says he returned to his residence, witnessed a conflict with a neighbor and didn't feel safe, stating "I don't fit into the world." Pt reports current suicidal ideation with plan to cut his wrists. Pt reports he has a history of cutting his wrist in the bathroom at [facility] within the past month. Pt states "nobody cares no more" and doesn't feel healthcare providers sincerely want to help him. He also reports he could access his roommate's gun. Pt reports symptoms including crying spells, social withdrawal, loss of interest in usual pleasures, fatigue, irritability, decreased sleep, increased appetite and feelings of worthlessness and hopelessness. He acknowledges feeling paranoid and states he believes people are talking about him and also believes his roommate may be trying to kill him. He denies homicidal ideation or history of violence. He denies current auditory or visual hallucinations. He reports smoking approximately 1-2 grams of marijuana 2-3 times per week. He denies alcohol or other substance use.  Pt identifies his chronic mental health problems as his primary stressor. He says he rents a room in a trailer which he shares with a roommate and roommate's girlfriend. Pt says he doesn't like where he is living and that he isolated in his room for the past year. He identifies his mother as his only support and says she is presently at [facility] recovering from brain surgery. He denies any legal problems. Pt was inpatient at [facility] 01/28/16-02/12/16, then at [facility] for elevated blood pressure  and then inpatient at [facility].  Pt is dressed in hospital scrubs, obese, alert, oriented x4 with normal speech and normal motor behavior. Eye contact is good and Pt is tearful at times. Pt's mood is depressed and helpless; affect is congruent with mood. Thought process is coherent and relevant. There is no indication Pt is currently responding to internal stimuli. Pt was cooperative throughout assessment. He is requesting to be admitted to an inpatient psychiatric facility.   Diagnosis: Schizophrenia  Past Medical History:  Past Medical History  Diagnosis Date  . Paranoid (HCC)   . AVM (arteriovenous malformation)   . Suicide attempt (HCC)   . Hypertension   . Deaf     Right ear  . History of cardiac cath 02/24/2010     LVH, EF of 50-55%, LAD has 10-15% proximal stenosis, by Dr. Sharyn Lull  . Anxiety, generalized   . Erectile dysfunction   . Delusional disorder (HCC)   . 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 Cardiac catheterization  01/2010    "essentially negative"  . Fracture surgery Left     ORIF left arm as a child    Family History:  Family History  Problem Relation Age of Onset  . Cancer Mother   . Bipolar disorder Maternal Aunt   . Bipolar disorder Maternal Aunt   . Hypertension Father   . Coronary artery disease Maternal Grandmother   . Hypertension Maternal Grandmother   . Diabetes Maternal Grandmother   . Coronary artery disease Maternal Uncle   . Hypertension Maternal Uncle   . Diabetes  Maternal Aunt   . Leukemia Maternal Uncle     Social History:  reports that he has been smoking Cigarettes.  He does not have any smokeless tobacco history on file. He reports that he drinks alcohol. He reports that he uses illicit drugs (Marijuana).  Additional Social History:  Alcohol / Drug Use Pain Medications: See MAR Prescriptions: See MAR Over the Counter: See MAR History of alcohol / drug use?:  Yes Longest period of sobriety (when/how long): 5 years Negative Consequences of Use: Financial Withdrawal Symptoms:  (Pt denies withdrawal) Substance #1 Name of Substance 1: Cannibis 1 - Age of First Use: 16 1 - Amount (size/oz): 2 grams   1 - Frequency: 2-3 times per week 1 - Duration: on and off for years 1 - Last Use / Amount: 02/13/16  CIWA: CIWA-Ar BP: 163/99 mmHg Pulse Rate: 72 COWS:    PATIENT STRENGTHS: (choose at least two) Ability for insight Average or above average intelligence Capable of independent living MetallurgistCommunication skills Financial means General fund of knowledge Motivation for treatment/growth Supportive family/friends  Allergies: No Known Allergies  Home Medications:  (Not in a hospital admission)  OB/GYN Status:  No LMP for male patient.  General Assessment Data Location of Assessment: [facility] ED TTS Assessment: In system Is this a Tele or Face-to-Face Assessment?: Face-to-Face Is this an Initial Assessment or a Re-assessment for this encounter?: Initial Assessment Marital status: Single Maiden name: NA Is patient pregnant?: No Pregnancy Status: No Living Arrangements: Other (Comment) (Rents room) Can pt return to current living arrangement?: Yes Admission Status: Voluntary Is patient capable of signing voluntary admission?: Yes Referral Source: Self/Family/Friend Insurance type: Medicare and Medicaid     Crisis Care Plan Living Arrangements: Other (Comment) (Rents room) Legal Guardian: Other: (None) Name of Psychiatrist: none Name of Therapist: none  Education Status Is patient currently in school?: No Current Grade: NA Highest grade of school patient has completed: 5811 Name of school: na Contact person: na  Risk to self with the past 6 months Suicidal Ideation: Yes-Currently Present Has patient been a risk to self within the past 6 months prior to admission? : Yes Suicidal Intent: Yes-Currently Present Has patient had any suicidal  intent within the past 6 months prior to admission? : Yes Is patient at risk for suicide?: Yes Suicidal Plan?: Yes-Currently Present Has patient had any suicidal plan within the past 6 months prior to admission? : Yes Specify Current Suicidal Plan: Cut his wrists Access to Means: Yes Specify Access to Suicidal Means: Access to knives at home What has been your use of drugs/alcohol within the last 12 months?: Pt reports marijuana use Previous Attempts/Gestures: Yes How many times?: 3 Other Self Harm Risks: None Triggers for Past Attempts: Unknown Intentional Self Injurious Behavior: None Family Suicide History: No Recent stressful life event(s): Other (Comment) ([facility]charged from [facility]) Persecutory voices/beliefs?: Yes Depression: Yes Depression Symptoms: Despondent, Tearfulness, Isolating, Fatigue, Guilt, Loss of interest in usual pleasures, Feeling worthless/self pity, Feeling angry/irritable Substance abuse history and/or treatment for substance abuse?: Yes Suicide prevention information given to non-admitted patients: Not applicable  Risk to Others within the past 6 months Homicidal Ideation: No Does patient have any lifetime risk of violence toward others beyond the six months prior to admission? : No Thoughts of Harm to Others: No Current Homicidal Intent: No Current Homicidal Plan: No Access to Homicidal Means: No Identified Victim: None History of harm to others?: No Assessment of Violence: None Noted Violent Behavior Description: Pt denies history  of violence Does patient have access to weapons?: Yes (Comment) (Pt reports he could access his roommate's gun) Criminal Charges Pending?: No Does patient have a court date: No Is patient on probation?: No  Psychosis Hallucinations: None noted Delusions: Persecutory (Pt reports he feels people are following him, talkinog about)  Mental Status Report Appearance/Hygiene: In scrubs Eye Contact: Good Motor Activity:  Unremarkable Speech: Logical/coherent Level of Consciousness: Alert Mood: Depressed, Helpless Affect: Depressed Anxiety Level: Minimal Thought Processes: Coherent, Relevant Judgement: Partial Orientation: Person, Place, Situation, Time, Appropriate for developmental age Obsessive Compulsive Thoughts/Behaviors: None  Cognitive Functioning Concentration: Normal Memory: Recent Intact, Remote Intact IQ: Average Insight: Fair Impulse Control: Fair Appetite: Good Weight Loss: 0 Weight Gain: 100 (Reports 100 ppound weight gain over past year) Sleep: Decreased Total Hours of Sleep: 4 Vegetative Symptoms: None  ADLScreening [facility] Assessment Services) Patient's cognitive ability adequate to safely complete daily activities?: Yes Patient able to express need for assistance with ADLs?: Yes Independently performs ADLs?: Yes (appropriate for developmental age)  Prior Inpatient Therapy Prior Inpatient Therapy: Yes Prior Therapy Dates: 2017 Prior Therapy Facilty/Provider(s): [facility] Reason for Treatment: Psychosis  Prior Outpatient Therapy Prior Outpatient Therapy: No Prior Therapy Dates: na Prior Therapy Facilty/Provider(s): [facility] Reason for Treatment: PTSD, paranoia Does patient have an ACCT team?: No Does patient have Intensive In-House Services?  : No Does patient have [facility] services? : No Does patient have P4CC services?: No  ADL Screening (condition at time of admission) Patient's cognitive ability adequate to safely complete daily activities?: Yes Is the patient deaf or have difficulty hearing?: No Does the patient have difficulty seeing, even when wearing glasses/contacts?: No Does the patient have difficulty concentrating, remembering, or making decisions?: No Patient able to express need for assistance with ADLs?: Yes Does the patient have difficulty dressing or bathing?: No Independently performs ADLs?: Yes (appropriate for developmental age) Does the patient  have difficulty walking or climbing stairs?: No Weakness of Legs: None Weakness of Arms/Hands: None  Home Assistive Devices/Equipment Home Assistive Devices/Equipment: None    Abuse/Neglect Assessment (Assessment to be complete while patient is alone) Physical Abuse: Denies Verbal Abuse: Denies Sexual Abuse: Denies Exploitation of patient/patient's resources: Denies Self-Neglect: Denies     Merchant navy officer (For Healthcare) Does patient have an advance directive?: No Would patient like information on creating an advanced directive?: No - patient declined information    Additional Information 1:1 In Past 12 Months?: Yes CIRT Risk: No Elopement Risk: No Does patient have medical clearance?: Yes     Disposition: Hassie Bruce at [facility], confirmed adult unit is at capacity. Gave clinical report to Malachy Chamber, NP who said Pt meets criteria for inpatient psychiatric treatment. TTS will contact other facilities for placement. Notified Dr. Tilden Fossa and Aundra Millet, RN of recommendation.  Disposition Initial Assessment Completed for this Encounter: Yes Disposition of Patient: Other dispositions   Pamalee Leyden, LPC, [facility], [facility] Triage Specialist [phone]   Pamalee Leyden 03/04/2016 7:44 PM

## 2016-03-04 NOTE — ED Notes (Signed)
Patient reports SI. Denies HI, AVH. Rates feelings of anxiety 5/10, depression 9/10.   Encouragement offered. Oriented to the unit.  Q 15 safety checks in place.

## 2016-03-04 NOTE — ED Provider Notes (Signed)
CSN: [id number]     Arrival date & time 03/04/16  1632 History   First MD Initiated Contact with Patient 03/04/16 1656     Chief Complaint  Patient presents with  . Suicidal     The history is provided by the patient. No language interpreter was used.   Timothy Jackson is a 45 y.o. male who presents to the Emergency Department complaining of SI.  He presents voluntarily for suicidal ideations. He was discharged home at this morning from a psychiatric facility. He states that he doesn't think he can handle it at home and he has ongoing suicidal ideations since being home. He wants to cut himself a sharp objects. He has not taken his medications since leaving the hospital and has not filled the prescriptions. He reports paranoia. Denies hallucinations. He lives with roommates. His neighbor was yelling this morning and he thinks that what made his feelings of suicidal ideation worse.  Past Medical History  Diagnosis Date  . Paranoid (HCC)   . AVM (arteriovenous malformation)   . Suicide attempt (HCC)   . Hypertension   . Deaf     Right ear  . History of cardiac cath 02/24/2010     LVH, EF of 50-55%, LAD has 10-15% proximal stenosis, by Dr. Sharyn Lull  . Anxiety, generalized   . Erectile dysfunction   . Delusional disorder (HCC)   . 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 Cardiac catheterization  01/2010    "essentially negative"  . Fracture surgery Left     ORIF left arm as a child   Family History  Problem Relation Age of Onset  . Cancer Mother   . Bipolar disorder Maternal Aunt   . Bipolar disorder Maternal Aunt   . Hypertension Father   . Coronary artery disease Maternal Grandmother   . Hypertension Maternal Grandmother   . Diabetes Maternal Grandmother   . Coronary artery disease Maternal Uncle   . Hypertension Maternal Uncle   . Diabetes Maternal Aunt   . Leukemia Maternal Uncle    Social History  Substance Use  Topics  . Smoking status: Current Some Day Smoker    Types: Cigarettes  . Smokeless tobacco: None  . Alcohol Use: 0.0 oz/week     Comment: daily    Review of Systems  All other systems reviewed and are negative.     Allergies  Review of patient's allergies indicates no known allergies.  Home Medications   Prior to Admission medications   Medication Sig Start Date End Date Taking? Authorizing Provider  aspirin 81 MG chewable tablet Chew by mouth.   Yes Historical Provider, MD  aspirin EC 81 MG EC tablet Take 1 tablet (81 mg total) by mouth daily with breakfast. 02/11/16  Yes Jolanta B Pucilowska, MD  furosemide (LASIX) 20 MG tablet Take 1 tablet (20 mg total) by mouth daily. 02/22/16  Yes Trixie Dredge, PA-C  hydrALAZINE (APRESOLINE) 100 MG tablet Take 1 tablet (100 mg total) by mouth every 8 (eight) hours. 02/11/16  Yes Shari Prows, MD  isosorbide mononitrate (IMDUR) 120 MG 24 hr tablet Take 1 tablet (120 mg total) by mouth daily with breakfast. 02/11/16  Yes Jolanta B Pucilowska, MD  lisinopril (PRINIVIL,ZESTRIL) 40 MG tablet Take 40 mg by mouth daily.    Yes Historical Provider, MD  metoprolol succinate (TOPROL-XL) 100 MG 24 hr tablet Take 100 mg by mouth daily.    Yes Historical Provider,  MD  metoprolol tartrate (LOPRESSOR) 100 MG tablet Take 1 tablet (100 mg total) by mouth 2 (two) times daily at 8 am and 10 pm. 02/11/16  Yes Jolanta B Pucilowska, MD  paliperidone (INVEGA) 3 MG 24 hr tablet Take 3 mg by mouth daily.   Yes Historical Provider, MD  paliperidone (INVEGA) 6 MG 24 hr tablet Take 6 mg by mouth daily.    Yes Historical Provider, MD  prazosin (MINIPRESS) 2 MG capsule Take 2 mg by mouth 2 (two) times daily.   Yes Historical Provider, MD  traZODone (DESYREL) 150 MG tablet Take 1 tablet (150 mg total) by mouth at bedtime. 02/11/16  Yes Jolanta B Pucilowska, MD  traZODone (DESYREL) 150 MG tablet Take 150 mg by mouth at bedtime.   Yes Historical Provider, MD  ziprasidone  (GEODON) 80 MG capsule Take 80 mg by mouth at bedtime.    Yes Historical Provider, MD  acetaminophen (TYLENOL) 650 MG CR tablet Take 650 mg by mouth every 6 (six) hours as needed for pain.    Historical Provider, MD  aluminum-magnesium hydroxide-simethicone (MAALOX) 200-200-20 MG/5ML SUSP Take 30 mLs by mouth every 4 (four) hours as needed (indigestion).    Historical Provider, MD  hydrOXYzine (ATARAX/VISTARIL) 50 MG tablet Take 1 tablet (50 mg total) by mouth 3 (three) times daily as needed for anxiety. 02/11/16   Jolanta B Pucilowska, MD   BP 133/92 mmHg  Pulse 57  Temp(Src) 98.3 F (36.8 C) (Oral)  Resp 22  SpO2 95% Physical Exam  Constitutional: He is oriented to person, place, and time. He appears well-developed and well-nourished.  HENT:  Head: Normocephalic and atraumatic.  Cardiovascular: Normal rate and regular rhythm.   Pulmonary/Chest: Effort normal. No respiratory distress.  Musculoskeletal: Normal range of motion.  Healing laceration to the left volar wrist. 6 sutures remaining in place.  Neurological: He is alert and oriented to person, place, and time.  Skin: Skin is warm.  Psychiatric:  Flat affect  Nursing note and vitals reviewed.   ED Course  Procedures (including critical care time) Labs Review Labs Reviewed  COMPREHENSIVE METABOLIC PANEL - Abnormal; Notable for the following:    Glucose, Bld 104 (*)    Creatinine, Ser 1.33 (*)    All other components within normal limits  ACETAMINOPHEN LEVEL - Abnormal; Notable for the following:    Acetaminophen (Tylenol), Serum <10 (*)    All other components within normal limits  CBC - Abnormal; Notable for the following:    Hemoglobin 12.3 (*)    HCT 37.9 (*)    All other components within normal limits  URINE RAPID DRUG SCREEN, HOSP PERFORMED - Abnormal; Notable for the following:    Tetrahydrocannabinol POSITIVE (*)    All other components within normal limits  ETHANOL  SALICYLATE LEVEL    Imaging Review No  results found. I have personally reviewed and evaluated these images and lab results as part of my medical decision-making.   EKG Interpretation None      MDM   Final diagnoses:  Suicidal ideation    Patient presenting for psychiatric evaluation. Remaining sutures in the left wrist were removed. Patient tolerated the procedure well. It appears that his sutures were placed on May 22 on chart review.  Patient has been medically cleared for psychiatric treatment for his ongoing suicidal ideations.  Tilden FossaElizabeth Tahlia Deamer, MD 03/05/16 [phone]

## 2016-03-05 LAB — RAPID URINE DRUG SCREEN, HOSP PERFORMED
Amphetamines: NOT DETECTED
Barbiturates: NOT DETECTED
Benzodiazepines: NOT DETECTED
COCAINE: NOT DETECTED
OPIATES: NOT DETECTED
TETRAHYDROCANNABINOL: POSITIVE — AB

## 2016-03-05 NOTE — ED Notes (Signed)
Patient noted sleeping in room. No complaints, stable, in no acute distress. Q15 minute rounds and monitoring via Security Cameras to continue.  

## 2016-03-05 NOTE — BH Assessment (Signed)
[facility] Assessment Progress Note  Per Thedore MinsMojeed Akintayo, MD, this pt requires psychiatric hospitalization at this time.  At 14:01 Revonda Standardllison calls [facility].  Pt has been accepted to their facility by Dr Wendall StadeKohl, however, they would prefer for pt to be placed under IVC.  Patria ManeMay Agustin, NP concurs with this decision, as does EDP Geoffery Lyonsouglas DeLo, MD, who has initiated IVC.  [facility] [place] [facility], and at 14:50 Hortense RamalMagistrate Watts confirms receipt.  As of this writing, service of Findings and Custody Order is pending.  IVC documents along with clinical and demographic information has been faxed to [facility], per their request.  Pt's nurse has been notified, and agrees to call report to [phone].  [facility]  Doylene Canninghomas Edwen Mclester, MA Triage Specialist [phone]
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
## 2016-03-05 NOTE — ED Notes (Signed)
Patient noted in room. No complaints, stable, in no acute distress. Q15 minute rounds and monitoring via Security Cameras to continue.  

## 2016-03-05 NOTE — BH Assessment (Signed)
[facility]  03/05/16: [facility] per Wendall StadeKohl MD.

## 2016-03-05 NOTE — ED Notes (Signed)
[facility]. Patient alert and oriented, warm and dry, in no acute distress. Patient denies HI, AVH and pain. Pt. States he is still having SI without plan. Patient made aware of Q15 minute rounds and security cameras for their safety. Patient instructed to come to me with needs or concerns.

## 2016-03-06 NOTE — ED Notes (Signed)
Message left with sheriff for transport-message left prior to shift change

## 2016-03-06 NOTE — ED Notes (Signed)
Sheriff is here to transport 

## 2016-03-06 NOTE — ED Notes (Signed)
Patient noted sleeping in room. No complaints, stable, in no acute distress. Q15 minute rounds and monitoring via Security Cameras to continue.  

## 2016-03-06 NOTE — ED Notes (Signed)
[facility] notified of patients transport 

## 2016-03-06 NOTE — ED Notes (Signed)
Sheriff will be here shortly, pt is aware that he is going to be [facility]

## 2016-03-06 NOTE — ED Notes (Addendum)
[facility] difficulty [facility] with sheriff.  IVC papers, EMTELA, MAR report, transfer report, assessment note, face sheet and belongings given to sheriff.

## 2016-03-06 NOTE — ED Notes (Signed)
Patient noted sleeping n room. No complaints, stable, in no acute distress. Q15 minute rounds and monitoring via Security Cameras to continue.  

## 2016-03-21 ENCOUNTER — Emergency Department (HOSPITAL_COMMUNITY)
Admission: EM | Admit: 2016-03-21 | Discharge: 2016-03-22 | Disposition: A | Discharge status: Other Acute Inpatient Hospital | Payer: Medicare Other | Attending: Emergency Medicine | Admitting: Emergency Medicine

## 2016-03-21 ENCOUNTER — Encounter (HOSPITAL_COMMUNITY): Payer: Self-pay | Admitting: Emergency Medicine

## 2016-03-21 DIAGNOSIS — F1210 Cannabis abuse, uncomplicated: Secondary | ICD-10-CM | POA: Diagnosis not present

## 2016-03-21 DIAGNOSIS — I10 Essential (primary) hypertension: Secondary | ICD-10-CM | POA: Insufficient documentation

## 2016-03-21 DIAGNOSIS — F331 Major depressive disorder, recurrent, moderate: Secondary | ICD-10-CM | POA: Insufficient documentation

## 2016-03-21 DIAGNOSIS — F411 Generalized anxiety disorder: Secondary | ICD-10-CM | POA: Insufficient documentation

## 2016-03-21 DIAGNOSIS — R45851 Suicidal ideations: Secondary | ICD-10-CM | POA: Diagnosis not present

## 2016-03-21 DIAGNOSIS — F17210 Nicotine dependence, cigarettes, uncomplicated: Secondary | ICD-10-CM | POA: Diagnosis not present

## 2016-03-21 LAB — COMPREHENSIVE METABOLIC PANEL
ALK PHOS: 63 U/L (ref 38–126)
ALT: 36 U/L (ref 17–63)
ANION GAP: 7 (ref 5–15)
AST: 22 U/L (ref 15–41)
Albumin: 4.1 g/dL (ref 3.5–5.0)
BUN: 21 mg/dL — ABNORMAL HIGH (ref 6–20)
CALCIUM: 9.3 mg/dL (ref 8.9–10.3)
CO2: 25 mmol/L (ref 22–32)
CREATININE: 1.24 mg/dL (ref 0.61–1.24)
Chloride: 105 mmol/L (ref 101–111)
Glucose, Bld: 215 mg/dL — ABNORMAL HIGH (ref 65–99)
Potassium: 4.2 mmol/L (ref 3.5–5.1)
Sodium: 137 mmol/L (ref 135–145)
TOTAL PROTEIN: 7.6 g/dL (ref 6.5–8.1)
Total Bilirubin: 0.7 mg/dL (ref 0.3–1.2)

## 2016-03-21 LAB — CBC
HEMATOCRIT: 40.7 % (ref 39.0–52.0)
Hemoglobin: 13.3 g/dL (ref 13.0–17.0)
MCH: 26.8 pg (ref 26.0–34.0)
MCHC: 32.7 g/dL (ref 30.0–36.0)
MCV: 81.9 fL (ref 78.0–100.0)
Platelets: 272 10*3/uL (ref 150–400)
RBC: 4.97 MIL/uL (ref 4.22–5.81)
RDW: 14 % (ref 11.5–15.5)
WBC: 5.6 10*3/uL (ref 4.0–10.5)

## 2016-03-21 LAB — RAPID URINE DRUG SCREEN, HOSP PERFORMED
AMPHETAMINES: NOT DETECTED
BARBITURATES: NOT DETECTED
BENZODIAZEPINES: NOT DETECTED
Cocaine: NOT DETECTED
Opiates: NOT DETECTED
Tetrahydrocannabinol: POSITIVE — AB

## 2016-03-21 LAB — SALICYLATE LEVEL: Salicylate Lvl: <4 mg/dL (ref 2.8–30.0)

## 2016-03-21 LAB — ETHANOL: Alcohol, Ethyl (B): <5 mg/dL (ref <5)

## 2016-03-21 LAB — ACETAMINOPHEN LEVEL: Acetaminophen (Tylenol), Serum: <10 ug/mL — ABNORMAL LOW (ref 10–30)

## 2016-03-21 MED ORDER — ACETAMINOPHEN 325 MG PO TABS: 650.0000 mg | ORAL_TABLET | ORAL | Status: DC | PRN

## 2016-03-21 MED ORDER — ISOSORBIDE MONONITRATE ER 60 MG PO TB24
120.0000 mg | ORAL_TABLET | Freq: Every day | ORAL | Status: DC
  Administered 2016-03-21: 120 mg via ORAL
  Filled 2016-03-21 (×3): qty 2

## 2016-03-21 MED ORDER — LORAZEPAM 1 MG PO TABS
1.0000 mg | ORAL_TABLET | Freq: Three times a day (TID) | ORAL | Status: DC | PRN
  Administered 2016-03-21: 1 mg via ORAL
  Filled 2016-03-21: qty 1

## 2016-03-21 MED ORDER — HYDRALAZINE HCL 50 MG PO TABS
100.0000 mg | ORAL_TABLET | Freq: Three times a day (TID) | ORAL | Status: DC
  Administered 2016-03-21 – 2016-03-22 (×3): 100 mg via ORAL
  Filled 2016-03-21 (×9): qty 2

## 2016-03-21 MED ORDER — ASPIRIN 81 MG PO CHEW
81.0000 mg | CHEWABLE_TABLET | Freq: Every day | ORAL | Status: DC
  Administered 2016-03-21: 81 mg via ORAL
  Filled 2016-03-21: qty 1

## 2016-03-21 MED ORDER — HYDROXYZINE HCL 25 MG PO TABS: 50.0000 mg | ORAL_TABLET | Freq: Three times a day (TID) | ORAL | Status: DC | PRN

## 2016-03-21 MED ORDER — TRAZODONE HCL 50 MG PO TABS
150.0000 mg | ORAL_TABLET | Freq: Every day | ORAL | Status: DC
  Administered 2016-03-21: 150 mg via ORAL
  Filled 2016-03-21: qty 1

## 2016-03-21 MED ORDER — METOPROLOL TARTRATE 25 MG PO TABS
100.0000 mg | ORAL_TABLET | Freq: Two times a day (BID) | ORAL | Status: DC
  Administered 2016-03-21 – 2016-03-22 (×3): 100 mg via ORAL
  Filled 2016-03-21 (×2): qty 4

## 2016-03-21 MED ORDER — PALIPERIDONE ER 3 MG PO TB24
3.0000 mg | ORAL_TABLET | Freq: Every day | ORAL | Status: DC
  Administered 2016-03-21: 3 mg via ORAL
  Filled 2016-03-21 (×3): qty 1

## 2016-03-21 MED ORDER — ONDANSETRON HCL 4 MG PO TABS: 4.0000 mg | ORAL_TABLET | Freq: Three times a day (TID) | ORAL | Status: DC | PRN

## 2016-03-21 MED ORDER — HYDRALAZINE HCL 50 MG PO TABS
100.0000 mg | ORAL_TABLET | Freq: Once | ORAL | Status: AC
  Administered 2016-03-21: 100 mg via ORAL
  Filled 2016-03-21: qty 2

## 2016-03-21 MED ORDER — LORAZEPAM 1 MG PO TABS
1.0000 mg | ORAL_TABLET | Freq: Once | ORAL | Status: AC
  Administered 2016-03-21: 1 mg via ORAL
  Filled 2016-03-21: qty 1

## 2016-03-21 NOTE — ED Notes (Signed)
Patient noted in room. No complaints, stable, in no acute distress.  Pt reports anxiety and asking for medication.Q15 minute rounds and monitoring via security cameras continue for safety.

## 2016-03-21 NOTE — ED Notes (Signed)
Pt is able to ambulate with no assistance, pt has a steady gait.

## 2016-03-21 NOTE — ED Provider Notes (Signed)
CSN: [id number]     Arrival date & time 03/21/16  1115 History   First MD Initiated Contact with Patient 03/21/16 1145     Chief Complaint  Patient presents with  . Suicidal    Patient is a 45 y.o. male presenting with mental health disorder. The history is provided by the patient.  Mental Health Problem Presenting symptoms: suicidal thoughts   Degree of incapacity (severity):  Moderate Onset quality:  Gradual Timing:  Constant Chronicity:  Recurrent Relieved by:  Nothing Worsened by:  Nothing tried Associated symptoms: headaches   Associated symptoms: no abdominal pain and no chest pain   Patient presents for suicidal ideation He reports he wants to cut himself - he has not attempted yet No overdose No fever No vomiting He reports mild HA No CP reported He reports he is not med compliant   Past Medical History  Diagnosis Date  . Paranoid (HCC)   . AVM (arteriovenous malformation)   . Suicide attempt ([facility])   . Hypertension   . Deaf     Right ear  . History of cardiac cath 02/24/2010     LVH, EF of 50-55%, LAD has 10-15% proximal stenosis, by Dr. Sharyn LullHarwani  . Anxiety, generalized   . Erectile dysfunction   . Delusional disorder (H[facility])   . 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 Cardiac catheterization  01/2010    "essentially negative"  . Fracture surgery Left     ORIF left arm as a child   Family History  Problem Relation Age of Onset  . Cancer Mother   . Bipolar disorder Maternal Aunt   . Bipolar disorder Maternal Aunt   . Hypertension Father   . Coronary artery disease Maternal Grandmother   . Hypertension Maternal Grandmother   . Diabetes Maternal Grandmother   . Coronary artery disease Maternal Uncle   . Hypertension Maternal Uncle   . Diabetes Maternal Aunt   . Leukemia Maternal Uncle    Social History  Substance Use Topics  . Smoking status: Current Some Day Smoker    Types: Cigarettes   . Smokeless tobacco: None  . Alcohol Use: 0.0 oz/week     Comment: daily    Review of Systems  Constitutional: Negative for fever.  Cardiovascular: Negative for chest pain.  Gastrointestinal: Negative for abdominal pain.  Neurological: Positive for headaches.  Psychiatric/Behavioral: Positive for suicidal ideas.  All other systems reviewed and are negative.     Allergies  Review of patient's allergies indicates no known allergies.  Home Medications   Prior to Admission medications   Medication Sig Start Date End Date Taking? Authorizing Provider  aspirin 81 MG chewable tablet Chew by mouth.    Historical Provider, MD  furosemide (LASIX) 20 MG tablet Take 1 tablet (20 mg total) by mouth daily. 02/22/16   Trixie DredgeEmily West, PA-C  hydrALAZINE (APRESOLINE) 100 MG tablet Take 1 tablet (100 mg total) by mouth every 8 (eight) hours. 02/11/16   Shari ProwsJolanta B Pucilowska, MD  hydrOXYzine (ATARAX/VISTARIL) 50 MG tablet Take 1 tablet (50 mg total) by mouth 3 (three) times daily as needed for anxiety. 02/11/16   Shari ProwsJolanta B Pucilowska, MD  isosorbide mononitrate (IMDUR) 120 MG 24 hr tablet Take 1 tablet (120 mg total) by mouth daily with breakfast. 02/11/16   Shari ProwsJolanta B Pucilowska, MD  metoprolol tartrate (LOPRESSOR) 100 MG tablet Take 1 tablet (100 mg total) by mouth 2 (two) times daily at 8 am  and 10 pm. 02/11/16   Jolanta B Pucilowska, MD  paliperidone (INVEGA) 3 MG 24 hr tablet Take 3 mg by mouth daily.    Historical Provider, MD  prazosin (MINIPRESS) 2 MG capsule Take 2 mg by mouth 2 (two) times daily.    Historical Provider, MD  traZODone (DESYREL) 150 MG tablet Take 1 tablet (150 mg total) by mouth at bedtime. 02/11/16   Jolanta B Pucilowska, MD   BP 234/128 mmHg  Pulse 86  Temp(Src) 98.2 F (36.8 C) (Oral)  Resp 20  SpO2 97% Physical Exam CONSTITUTIONAL: Disheveled, no acute distress HEAD: Normocephalic/atraumatic EYES: EOMI ENMT: Mucous membranes moist NECK: supple no meningeal  signs SPINE/BACK:entire spine nontender CV: S1/S2 noted, no murmurs/rubs/gallops noted LUNGS: Lungs are clear to auscultation bilaterally, no apparent distress ABDOMEN: soft, nontender,obese NEURO: Pt is awake/alert/appropriate, moves all extremitiesx4.  No facial droop.  No arm/leg drift EXTREMITIES: pulses normal/equal, full ROM, chronic edema to lower extremities, healing wound to left forearm SKIN: warm, color normal PSYCH: flat affect  ED Course  Procedures  1:56 PM Labs unremarkable Pt is in no distress He has significant HTN, but no signs of HTN emergency (no CP, no weakness, no distress) Will need home meds started as he admits noncompliance Awaiting psych eval He is medically stable  Labs Review Labs Reviewed  COMPREHENSIVE METABOLIC PANEL - Abnormal; Notable for the following:    Glucose, Bld 215 (*)    BUN 21 (*)    All other components within normal limits  ACETAMINOPHEN LEVEL - Abnormal; Notable for the following:    Acetaminophen (Tylenol), Serum <10 (*)    All other components within normal limits  URINE RAPID DRUG SCREEN, HOSP PERFORMED - Abnormal; Notable for the following:    Tetrahydrocannabinol POSITIVE (*)    All other components within normal limits  ETHANOL  SALICYLATE LEVEL  CBC    I have personally reviewed and evaluated these ab results as part of my medical decision-making.    MDM   Final diagnoses:  None    Nursing notes including past medical history and social history reviewed and considered in documentation Labs/vital reviewed myself and considered during evaluation    Zadie Rhineonald Jameika Kinn, MD 03/21/16 1359

## 2016-03-21 NOTE — ED Notes (Signed)
MD at bedside. 

## 2016-03-21 NOTE — ED Notes (Signed)
Patient here with complaints of SI, with a plan to cut self. Hx of same with depression. Denies AV Hallucinations.

## 2016-03-21 NOTE — BH Assessment (Signed)
Tele Assessment Note   Timothy Jackson is an 45 y.o. male, African American, Single who presents to [facility] ED with complaints of SI and recent attempt via cutting wrist. Patient states that he currently lives in a hotel out of suitcase and is homeless, with future plans of going [place] unspecified boarding house. Patient states that he has been having worsening thoughts of SI , intrusive with wanting clarity and peace, to end it all. Patient was not very verbal and stated that he did ned to talk to a psychiatrist about these intrusive thoughts of SI and depression elevation.  Patient acknowledges current SI with plan to cut wrist, but did not go through with it today and came to ER, but still thinks about it. Patient has history of cutting and multiple lacerations on both wrists, but denies it is cutting behavior, but is from previous attempts.  Patient acknowledges history of SI with prior attempts x 3. Patient denies history of AVH or current. Patient denies current or history of HI. Patient denies history of S.A. Patient acknowledges multiple inpatient hospitalizations this year with [facility] for [facility] and depression. Patient denies current or past outpt. Psychiatric treatment.  Patient is dressed in scrubs, upheavaled appearance, body odor and is alert and oriented x4. Patient speech was within normal limits and motor behavior appeared normal. Patient thought process is coherent. Patient does not appear to be responding to internal stimuli. Patient was cooperative throughout the assessment and states that he  is agreeable to inpatient psychiatric treatment.   Diagnosis: 296.33 [F33.2] Major Depressive Disorder, Recurrent Episode, Severe  Past Medical History:  Past Medical History  Diagnosis Date  . Paranoid (HCC)   . AVM (arteriovenous malformation)   . Suicide attempt (HCC)   . Hypertension   . Deaf     Right ear  . History of cardiac cath 02/24/2010     LVH, EF of  50-55%, LAD has 10-15% proximal stenosis, by Dr. Sharyn LullHarwani  . Anxiety, generalized   . Erectile dysfunction   . Delusional disorder (HCC)   . 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 Cardiac catheterization  01/2010    "essentially negative"  . Fracture surgery Left     ORIF left arm as a child    Family History:  Family History  Problem Relation Age of Onset  . Cancer Mother   . Bipolar disorder Maternal Aunt   . Bipolar disorder Maternal Aunt   . Hypertension Father   . Coronary artery disease Maternal Grandmother   . Hypertension Maternal Grandmother   . Diabetes Maternal Grandmother   . Coronary artery disease Maternal Uncle   . Hypertension Maternal Uncle   . Diabetes Maternal Aunt   . Leukemia Maternal Uncle     Social History:  reports that he has been smoking Cigarettes.  He does not have any smokeless tobacco history on file. He reports that he drinks alcohol. He reports that he uses illicit drugs (Marijuana).  Additional Social History:  Alcohol / Drug Use Pain Medications: SEE MAR Prescriptions: SEE MAR Over the Counter: SEE MAR History of alcohol / drug use?: No history of alcohol / drug abuse Longest period of sobriety (when/how long): Na, pt dneies SA history Negative Consequences of Use: Financial Withdrawal Symptoms:  (Pt denies withdrawal) Substance #1 Name of Substance 1: Cannibis 1 - Age of First Use: 16 1 - Amount (size/oz): 2  grams   1 - Frequency: 2-3 times per week 1 - Duration: on and off for years 1 - Last Use / Amount: 02/13/16  CIWA: CIWA-Ar BP: 164/98 mmHg Pulse Rate: 61 COWS:    PATIENT STRENGTHS: (choose at least two) Average or above average intelligence Capable of independent living  Allergies: No Known Allergies  Home Medications:  (Not in a hospital admission)  OB/GYN Status:  No LMP for male patient.  General Assessment Data Location of Assessment: [facility] ED T[facility]  Assessment: In system Is this a Tele or Face-to-Face Assessment?: Face-to-Face Is this an Initial Assessment or a Re-assessment for this encounter?: Initial Assessment Marital status: Single Maiden name: NA Is patient pregnant?: No Pregnancy Status: No Living Arrangements: Other (Comment) (pt stays in various hotels/ homeless) Can pt return to current living arrangement?: Yes Admission Status: Voluntary Is patient capable of signing voluntary admission?: Yes Referral Source: Self/Family/Friend Insurance type: m     Crisis Care Plan Living Arrangements: Other (Comment) (pt stays in various hotels/ homeless) Legal Guardian: Other: (None) Name of Psychiatrist: none Name of Therapist: none  Education Status Is patient currently in school?: No Current Grade: NA Highest grade of school patient has completed: 8611 Name of school: NA Contact person: NA  Risk to self with the past 6 months Suicidal Ideation: Yes-Currently Present Has patient been a risk to self within the past 6 months prior to admission? : Yes Suicidal Intent: Yes-Currently Present Has patient had any suicidal intent within the past 6 months prior to admission? : Yes Is patient at risk for suicide?: Yes Suicidal Plan?: Yes-Currently Present Has patient had any suicidal plan within the past 6 months prior to admission? : Yes Specify Current Suicidal Plan: cut wrist Access to Means: Yes Specify Access to Suicidal Means: Access to sharp objexts/ razors What has been your use of drugs/alcohol within the last 12 months?: pt denies Previous Attempts/Gestures: Yes How many times?: 3 Other Self Harm Risks: n Triggers for Past Attempts: Unknown Intentional Self Injurious Behavior: Cutting Comment - Self Injurious Behavior: pt custs self Family Suicide History: No Recent stressful life event(s): Other (Comment) Persecutory voices/beliefs?: No Depression: Yes Depression Symptoms: Despondent, Insomnia, Tearfulness,  Isolating, Fatigue, Guilt, Loss of interest in usual pleasures, Feeling worthless/self pity, Feeling angry/irritable Substance abuse history and/or treatment for substance abuse?: [facility] Suicide prevention information given to non-admitted patients: Yes  Risk to Others within the past 6 months Homicidal Ideation: No-Not Currently/Within Last 6 Months Does patient have any lifetime risk of violence toward others beyond the six months prior to admission? : No Thoughts of Harm to Others: No Current Homicidal Intent: No-Not Currently/Within Last 6 Months Current Homicidal Plan: No Access to Homicidal Means: No Identified Victim: none History of harm to others?: No Assessment of Violence: None Noted Violent Behavior Description: pt.denies Does patient have access to weapons?: No Criminal Charges Pending?: No Does patient have a court date: No Is patient on probation?: No  Psychosis Hallucinations: None noted Delusions: None noted  Mental Status Report Appearance/Hygiene: In scrubs Eye Contact: Good Motor Activity: Unremarkable Speech: Logical/coherent Level of Consciousness: Alert Mood: Despair Affect: Anxious, Depressed Anxiety Level: Panic Attacks Panic attack frequency:  (frequent, random) Most recent panic attack:  (03-20-16) Thought Processes: Coherent Judgement: Partial Orientation: Person, Place, Time, Situation, Appropriate for developmental age Obsessive Compulsive Thoughts/Behaviors: None  Cognitive Functioning Concentration: Normal Memory: Recent Intact, Remote Intact IQ: Average Insight: Fair Impulse Control: Fair Appetite: Good Weight Loss: 0 Weight Gain: 25  Sleep: Decreased Total Hours of Sleep: 4 Vegetative Symptoms: None  ADLScreening [facility] Assessment Services) Patient's cognitive ability adequate to safely complete daily activities?: Yes Patient able to express need for assistance with ADLs?: Yes Independently performs ADLs?: Yes (appropriate  for developmental age)  Prior Inpatient Therapy Prior Inpatient Therapy: Yes Prior Therapy Dates: 2017 Prior Therapy Facilty/Provider(s): [facility], [facility], [facility] Reason for Treatment: psychosis  Prior Outpatient Therapy Prior Outpatient Therapy: No Prior Therapy Dates: na Prior Therapy Facilty/Provider(s): None Reason for Treatment: NA Does patient have an ACCT team?: No Does patient have Intensive In-House Services?  : No Does patient have [facility] services? : No Does patient have P4CC services?: No  ADL Screening (condition at time of admission) Patient's cognitive ability adequate to safely complete daily activities?: Yes Is the patient deaf or have difficulty hearing?: No Does the patient have difficulty seeing, even when wearing glasses/contacts?: No Does the patient have difficulty concentrating, remembering, or making decisions?: No Patient able to express need for assistance with ADLs?: Yes Does the patient have difficulty dressing or bathing?: No Independently performs ADLs?: Yes (appropriate for developmental age) Does the patient have difficulty walking or climbing stairs?: No Weakness of Legs: None Weakness of Arms/Hands: None  Home Assistive Devices/Equipment Home Assistive Devices/Equipment: None    Abuse/Neglect Assessment (Assessment to be complete while patient is alone) Physical Abuse: Denies Verbal Abuse: Denies Sexual Abuse: Denies Exploitation of patient/patient's resources: Denies Self-Neglect: Denies Values / Beliefs Cultural Requests During Hospitalization: None Spiritual Requests During Hospitalization: None   Advance Directives (For Healthcare) Does patient have an advance directive?: No Would patient like information on creating an advanced directive?: No - patient declined information    Additional Information 1:1 In Past 12 Months?: Yes CIRT Risk: No Elopement Risk: No Does patient have medical clearance?: Yes     Disposition: Per  Shuvon NP, pt. To remain overnight, a.m. Psych eval. Disposition Initial Assessment Completed for this Encounter: Yes Disposition of Patient: Other dispositions (tbd upon consult with extender)  Hipolito Bayley 03/21/2016 2:12 PM

## 2016-03-21 NOTE — Progress Notes (Signed)
[facility] when hypertension under control.  He will need resources for housing (rooming house or halfway house) and an ACT team could assist him in the community and out of the ED.  He has been in inpatient psychiatric hospitals for the past month since his mother evicted him because she is dealing with stage 4 cancer and can no longer care for the patient.  [name] wants a psychiatrist he can speak to for more than "5 minutes" after initially stating he had not seen a psychiatrist during his multiple admissions to facilities.  When he was called on this as being correct, he admitted not counting short visits.  Recommended he get a therapist and psychiatrist at discharge to better meet his needs along with an ACT team, if possible.  A group home may be an alternative if the patient feels he can no longer live independently as he has been staying a few nights at a hotel in between hospitalizations.  A group home may need his needs better.  [name] often reports suicidal ideations with a plan to obtain admission.  [facility] Obs recommended.  Nanine MeansJamison Lord, PMH-NP

## 2016-03-21 NOTE — ED Notes (Signed)
Pt has in belonging bag:  Big black suitcases, black t-shirt, red jogging pants, [place] ID card, [employer] card, black phone.

## 2016-03-21 NOTE — ED Provider Notes (Signed)
I was asked to evaluate the patient as the psych provider. Patient's blood pressure initially extremely elevated. He should reported that he had not taken his hypertensive medications in about 4 days. Catha NottinghamJamison one of the nurse practitioners from behavioral health had stopped by and said that if his blood pressure was better controlled that he would be a good candidate for behavioral health. Patient was provided with his home blood pressure medications and one additional dose of oral hydralazine with great improvement in his blood pressure. Patient appeared medically clear for psychiatric evaluation and treatment. His home medications are ordered. At this time plan after talking to Drenda FreezeFran from behavioral health is for the patient to go to the [facility].  Leta BaptistEmily Roe Nguyen, MD 03/21/16 2122

## 2016-03-22 ENCOUNTER — Observation Stay (HOSPITAL_COMMUNITY)
Admission: AD | Admit: 2016-03-22 | Discharge: 2016-03-23 | Disposition: A | Discharge status: Home / Self Care | Payer: Medicare Other | Source: Intra-hospital | Attending: Psychiatry | Admitting: Psychiatry

## 2016-03-22 ENCOUNTER — Encounter (HOSPITAL_COMMUNITY): Payer: Self-pay | Admitting: *Deleted

## 2016-03-22 DIAGNOSIS — Z818 Family history of other mental and behavioral disorders: Secondary | ICD-10-CM | POA: Diagnosis not present

## 2016-03-22 DIAGNOSIS — Z7982 Long term (current) use of aspirin: Secondary | ICD-10-CM | POA: Insufficient documentation

## 2016-03-22 DIAGNOSIS — I110 Hypertensive heart disease with heart failure: Secondary | ICD-10-CM | POA: Diagnosis not present

## 2016-03-22 DIAGNOSIS — F331 Major depressive disorder, recurrent, moderate: Secondary | ICD-10-CM | POA: Diagnosis present

## 2016-03-22 DIAGNOSIS — F1220 Cannabis dependence, uncomplicated: Secondary | ICD-10-CM | POA: Diagnosis not present

## 2016-03-22 DIAGNOSIS — Z6841 Body Mass Index (BMI) 40.0 and over, adult: Secondary | ICD-10-CM | POA: Diagnosis not present

## 2016-03-22 DIAGNOSIS — F333 Major depressive disorder, recurrent, severe with psychotic symptoms: Secondary | ICD-10-CM | POA: Diagnosis present

## 2016-03-22 DIAGNOSIS — F411 Generalized anxiety disorder: Secondary | ICD-10-CM | POA: Diagnosis not present

## 2016-03-22 DIAGNOSIS — I252 Old myocardial infarction: Secondary | ICD-10-CM | POA: Insufficient documentation

## 2016-03-22 DIAGNOSIS — I209 Angina pectoris, unspecified: Secondary | ICD-10-CM | POA: Diagnosis not present

## 2016-03-22 DIAGNOSIS — H918X1 Other specified hearing loss, right ear: Secondary | ICD-10-CM | POA: Diagnosis not present

## 2016-03-22 DIAGNOSIS — F4010 Social phobia, unspecified: Secondary | ICD-10-CM | POA: Insufficient documentation

## 2016-03-22 DIAGNOSIS — R45851 Suicidal ideations: Secondary | ICD-10-CM | POA: Insufficient documentation

## 2016-03-22 DIAGNOSIS — F410 Panic disorder [episodic paroxysmal anxiety] without agoraphobia: Secondary | ICD-10-CM | POA: Insufficient documentation

## 2016-03-22 DIAGNOSIS — I509 Heart failure, unspecified: Secondary | ICD-10-CM | POA: Diagnosis not present

## 2016-03-22 DIAGNOSIS — F17210 Nicotine dependence, cigarettes, uncomplicated: Secondary | ICD-10-CM | POA: Diagnosis not present

## 2016-03-22 DIAGNOSIS — F1020 Alcohol dependence, uncomplicated: Secondary | ICD-10-CM | POA: Diagnosis not present

## 2016-03-22 DIAGNOSIS — F200 Paranoid schizophrenia: Principal | ICD-10-CM | POA: Insufficient documentation

## 2016-03-22 DIAGNOSIS — Q282 Arteriovenous malformation of cerebral vessels: Secondary | ICD-10-CM | POA: Insufficient documentation

## 2016-03-22 DIAGNOSIS — F332 Major depressive disorder, recurrent severe without psychotic features: Secondary | ICD-10-CM | POA: Diagnosis present

## 2016-03-22 MED ORDER — LORAZEPAM 1 MG PO TABS
1.0000 mg | ORAL_TABLET | Freq: Three times a day (TID) | ORAL | Status: DC | PRN
  Administered 2016-03-22: 1 mg via ORAL
  Filled 2016-03-22: qty 1

## 2016-03-22 MED ORDER — MAGNESIUM HYDROXIDE 400 MG/5ML PO SUSP: 30.0000 mL | Freq: Every day | ORAL | Status: DC | PRN

## 2016-03-22 MED ORDER — LORAZEPAM 1 MG PO TABS
ORAL_TABLET | ORAL | Status: AC
  Filled 2016-03-22: qty 1

## 2016-03-22 MED ORDER — ASPIRIN 81 MG PO CHEW
81.0000 mg | CHEWABLE_TABLET | Freq: Every day | ORAL | Status: DC
  Administered 2016-03-22 – 2016-03-23 (×2): 81 mg via ORAL
  Filled 2016-03-22 (×2): qty 1

## 2016-03-22 MED ORDER — TRAZODONE HCL 150 MG PO TABS
150.0000 mg | ORAL_TABLET | Freq: Every day | ORAL | Status: DC
Start: 2016-03-22 — End: 2016-03-23
  Administered 2016-03-22: 150 mg via ORAL
  Filled 2016-03-22: qty 1

## 2016-03-22 MED ORDER — METOPROLOL TARTRATE 25 MG PO TABS
100.0000 mg | ORAL_TABLET | Freq: Two times a day (BID) | ORAL | Status: DC
  Administered 2016-03-22 – 2016-03-23 (×2): 100 mg via ORAL
  Filled 2016-03-22 (×2): qty 4

## 2016-03-22 MED ORDER — HYDRALAZINE HCL 50 MG PO TABS
100.0000 mg | ORAL_TABLET | Freq: Three times a day (TID) | ORAL | Status: DC
  Administered 2016-03-22 – 2016-03-23 (×4): 100 mg via ORAL
  Filled 2016-03-22 (×11): qty 2

## 2016-03-22 MED ORDER — PALIPERIDONE ER 3 MG PO TB24
3.0000 mg | ORAL_TABLET | Freq: Every day | ORAL | Status: DC
Start: 2016-03-22 — End: 2016-03-22
  Administered 2016-03-22: 3 mg via ORAL
  Filled 2016-03-22: qty 1

## 2016-03-22 MED ORDER — HYDROXYZINE HCL 50 MG PO TABS: 50.0000 mg | ORAL_TABLET | Freq: Three times a day (TID) | ORAL | Status: DC | PRN

## 2016-03-22 MED ORDER — ONDANSETRON HCL 4 MG PO TABS: 4.0000 mg | ORAL_TABLET | Freq: Three times a day (TID) | ORAL | Status: DC | PRN

## 2016-03-22 MED ORDER — CLONIDINE HCL 0.1 MG PO TABS
0.1000 mg | ORAL_TABLET | Freq: Once | ORAL | Status: AC
  Administered 2016-03-22: 0.1 mg via ORAL
  Filled 2016-03-22: qty 1

## 2016-03-22 MED ORDER — PALIPERIDONE ER 3 MG PO TB24
6.0000 mg | ORAL_TABLET | Freq: Every day | ORAL | Status: DC
  Administered 2016-03-23: 6 mg via ORAL
  Filled 2016-03-22: qty 2

## 2016-03-22 MED ORDER — ACETAMINOPHEN 325 MG PO TABS: 650.0000 mg | ORAL_TABLET | ORAL | Status: DC | PRN

## 2016-03-22 MED ORDER — ALUM & MAG HYDROXIDE-SIMETH 200-200-20 MG/5ML PO SUSP: 30.0000 mL | ORAL | Status: DC | PRN

## 2016-03-22 MED ORDER — LORAZEPAM 1 MG PO TABS
1.0000 mg | ORAL_TABLET | Freq: Once | ORAL | Status: AC
  Administered 2016-03-22: 1 mg via ORAL

## 2016-03-22 MED ORDER — ACETAMINOPHEN 325 MG PO TABS: 650.0000 mg | ORAL_TABLET | Freq: Four times a day (QID) | ORAL | Status: DC | PRN

## 2016-03-22 MED ORDER — ISOSORBIDE MONONITRATE ER 60 MG PO TB24
120.0000 mg | ORAL_TABLET | Freq: Every day | ORAL | Status: DC
  Administered 2016-03-22 – 2016-03-23 (×2): 120 mg via ORAL
  Filled 2016-03-22 (×5): qty 2

## 2016-03-22 NOTE — BH Assessment (Signed)
[facility] Assessment Progress Note  Per Nanine MeansJamison Lord, DNP, this pt [facility].  Pt has reportedly been assigned to Obs 1.  Pt has signed Voluntary Admission and Consent for Treatment, as well as Consent to Release Information to no one, and signed forms have [facility].  Pt's nurse, Morrie Sheldonshley, has been notified, and agrees to send original paperwork along with pt via [facility] [place], and to call report to [phone] or [phone].  Doylene Canninghomas Kielyn Kardell, MA Triage Specialist [phone]

## 2016-03-22 NOTE — Progress Notes (Signed)
Patient ID: Timothy Jackson, male   DOB: Oct 23, 1970, 45 y.o.   MRN: [id number] PER STATE REGULATIONS 482.30  THIS CHART WAS REVIEWED FOR MEDICAL NECESSITY WITH RESPECT TO THE PATIENT'S ADMISSION/DURATION OF STAY.  NEXT REVIEW DATE:03/26/16  Loura HaltBARBARA Charlane Westry, RN, BSN CASE MANAGER

## 2016-03-22 NOTE — Progress Notes (Signed)
D: Pt is sleeping on and off in chair in upright position and is snoring very loudly.  Pt continues to be hypertensive.  Pt quiet when awake and requests snacks.  Pt up to void 2 times and ambulates well despite size.  A: Pt given support, meds and prn meds as symptoms require.  Pt given snacks and drinks. Pt continuously observed for safety except when in the bathroom.  R: Pt has eaten his snacks and is back asleep.  Pt remains safe.
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
## 2016-03-22 NOTE — ED Notes (Signed)
[facility] at facility to transfer pt to Obs unit per MD order. Pt signed for personal belongings and belongings given to transport service for transfer. Pt signed e-signature. Ambulatory off unit with [facility].

## 2016-03-22 NOTE — Progress Notes (Addendum)
D) Pt. Is 45 y.o. Morbidly obese African American male with current thoughts of SI, and is concerned about "not feeling safe around women and children". Pt. States "I have a lot I want to tell a psychiatrist".  Pt. Reports that he feels "claustrophobic" in OBS unit and states he feels "suicidal" .  Pt. Reports that he cut wrist 1 month ago in suicide attempt. Evidence of scars on bilateral wrists.   Pt. Reports that " people are following him" for last 5 years and "ruining opportunities for him" . Denies current A/V hallucinations, but does report preoccupation with "People who have been following me":   Pt. Is hypertensive 165/98, 68 sitting, and  192/116 standing, and is c/o 4/10 HA.  Pt. Is edematous in upper and lower extremities. and has bilateral healed wrist scars from self infliction.  Pt. Reports he did not make attempt prior to this hospitalization, but reports he is still feeling suicidal.  Per medical hx, pt. Was recently hospitalized for similar issues on 6/8 /17. A) Support offered.  VS taken, hypertensive and NP notified.  R) Receptive.  Affect flat. Pt. Offered food and fluid and opportunity to toilet, but did not void at this time. Pt. Ate graham crackers and diet gingerale.  Assisted in sitting up with feet elevated per pt. Comfort level.

## 2016-03-22 NOTE — Progress Notes (Signed)
D)Pt. Has been napping/ snoring much of the afternoon.  Pt. Remains in reclined position in chair.  Pt. Continues hypertensive, but denies other symptoms.  Pt.  C/o wanting to share his issues, but feels this unit is not private enough for him to "open up".  Pt. Reports he is feeling anxious to speak with the psychiatrist in the morning. A) Pt. Given support and medications as ordered.  Pt. Offered dinner.  Pt. Offered paper and pencil to write down issues that he is concerned about speaking aloud.  R) Pt. Declined paper.  Requesting seconds on dinner. Pt. Continues to appear sad.  Remains safe at this time.

## 2016-03-23 DIAGNOSIS — F200 Paranoid schizophrenia: Secondary | ICD-10-CM | POA: Diagnosis not present

## 2016-03-23 MED ORDER — TRAZODONE HCL 150 MG PO TABS: 150.0000 mg | ORAL_TABLET | Freq: Every day | ORAL | Status: AC

## 2016-03-23 MED ORDER — HYDRALAZINE HCL 100 MG PO TABS: 100.0000 mg | ORAL_TABLET | Freq: Three times a day (TID) | ORAL | Status: AC

## 2016-03-23 MED ORDER — ASPIRIN 81 MG PO CHEW: 81.0000 mg | CHEWABLE_TABLET | Freq: Every day | ORAL | Status: AC

## 2016-03-23 MED ORDER — PALIPERIDONE PALMITATE 156 MG/ML IM SUSP
156.0000 mg | Freq: Once | INTRAMUSCULAR | Status: AC
  Administered 2016-03-23: 156 mg via INTRAMUSCULAR
  Filled 2016-03-23: qty 1

## 2016-03-23 MED ORDER — FUROSEMIDE 20 MG PO TABS: 20.0000 mg | ORAL_TABLET | Freq: Every day | ORAL | Status: AC

## 2016-03-23 MED ORDER — METOPROLOL TARTRATE 100 MG PO TABS: 100.0000 mg | ORAL_TABLET | Freq: Two times a day (BID) | ORAL | Status: AC

## 2016-03-23 MED ORDER — PALIPERIDONE ER 6 MG PO TB24: 6.0000 mg | ORAL_TABLET | Freq: Every day | ORAL | Status: AC

## 2016-03-23 MED ORDER — ISOSORBIDE MONONITRATE ER 120 MG PO TB24: 120.0000 mg | ORAL_TABLET | Freq: Every day | ORAL | Status: AC

## 2016-03-23 NOTE — [facility] Counselor (Signed)
Pt is interested in receiving OPT resources; he has stressed interest in seeing a psychiatrist on a monthly basis. Pt also is requesting medication management. Pt is also homeless and states that he does not want to go to a shelter due to his anxiety. Pt states that he just wants to be seen by a psychiatrist so he can have someone to talk to. Pt states that he is interested in going to [place] to stay with his mother and he would like to receive mental health assistance while in [place]. OBS Counselor made telephone call to [facility] in [place] and asked to schedule an appointment for OPT services. This Clinical research associatewriter was informed that pt needs to have his ID, insurance card, and a current list of medications upon arrival. This Clinical research associatewriter also faxed out supporting information to [facility] in [place] at the request of Dr. Lucianne MussKumar for ACTT services for pt. Pt was also given an Invega shot per Dr. Lucianne MussKumar and his diagnosis was changed to "Paranoid schizophrenia". Pt will be provided with a taxi voucher for transportation to the bus station and then pt will take the [employer] to [place] where he is expected to follow up [facility]. Pt is expected to be discharged after 5:30pm.   Ardelle ParkLatoya McNeil, MA OBS Counselor

## 2016-03-23 NOTE — Progress Notes (Signed)
Patient received AVS sheets, belongings, medication scripts, and gave voucher to cab driver. Patient d/c'd at 17:53

## 2016-03-23 NOTE — Progress Notes (Signed)
Patient is calm and cooperative. He is guarded. He has concerns about discharged and stated that he feel suicidal with no plan. Patient verbally consented to safety plan. Patient remains safe with constant observation, education, support, and encouragement offered. Patient is receptive and compliant. Will continue to monitor.
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
## 2016-03-23 NOTE — Discharge Instructions (Signed)
Patient received Invega Sustenna 156 mg on 03/23/2016. [name] will be due for his next injection  of 117 mg in one month at his place of psychiatric follow up in [place], [place].

## 2016-04-23 NOTE — Discharge Summary (Signed)
  5/28 /17  Discharge Summary from Inpatient Psychiatric Unit  Patient is a 45 year old man, admitted to [facility]  On 5/23 for depression and paranoid  Ideations History of HTN, Obesity , AV malformation  On 02/22/16 was transferred to ER due to elevated BP in spite of antihypertensive medication management and dyspnea.  Dx- MDD , with psychotic symptoms

## 2016-08-27 DEATH — deceased

## 2017-05-16 IMAGING — US US RENAL
1 series · 14 of 25 positions shown · non-contrast
Comparison: None.

CLINICAL DATA: Acute renal failure

EXAM:
RENAL / URINARY TRACT ULTRASOUND COMPLETE

[Series 1: us renal · 0.27mm/px · 14 of 51 slices shown]
[im 1/51]
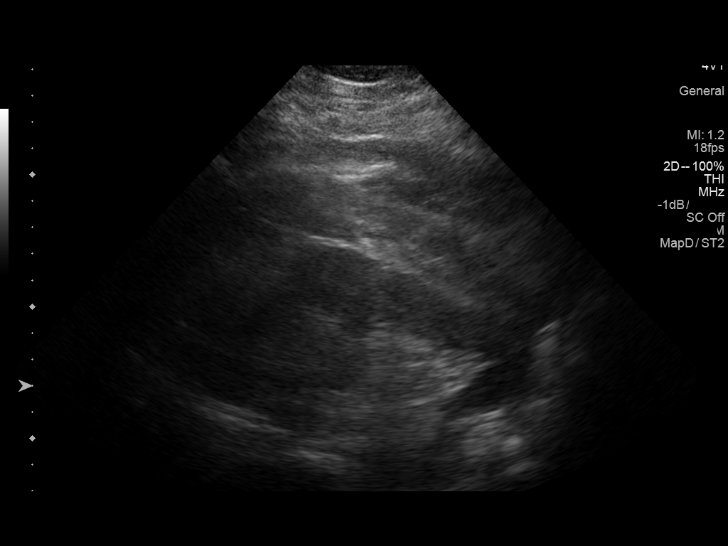
[im 5/51]
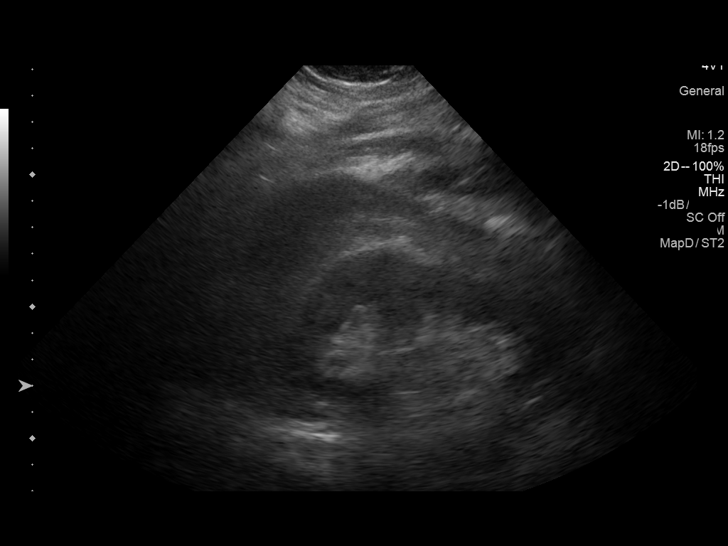
[im 9/51]
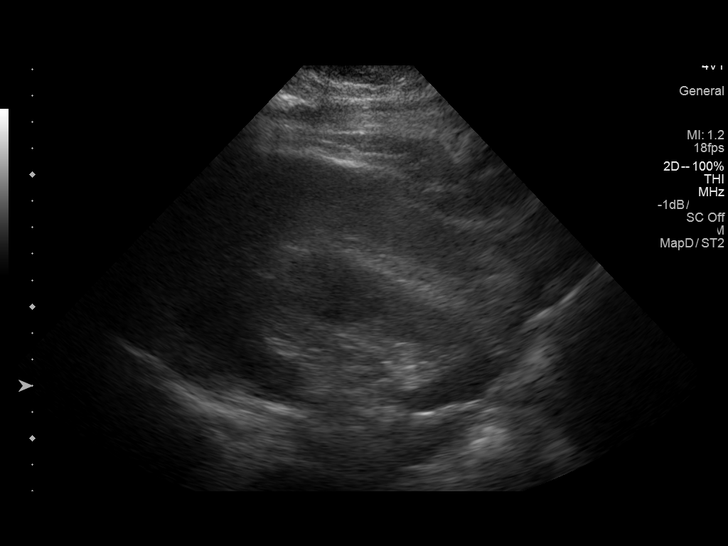
[im 13/51]
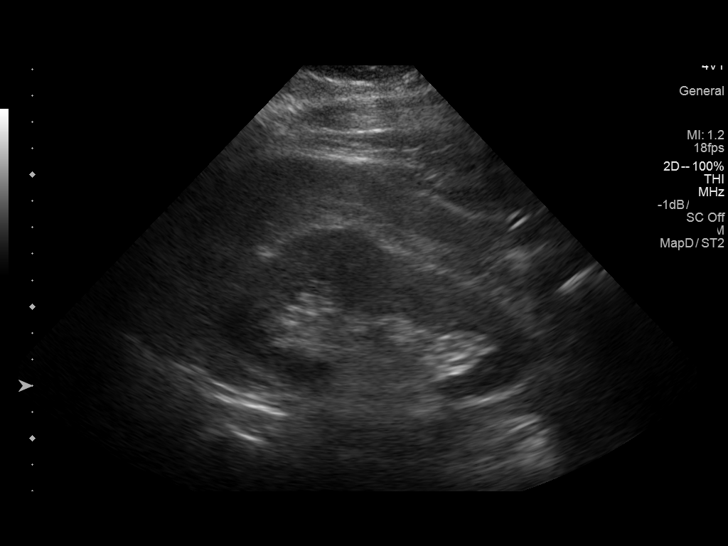
[im 17/51]
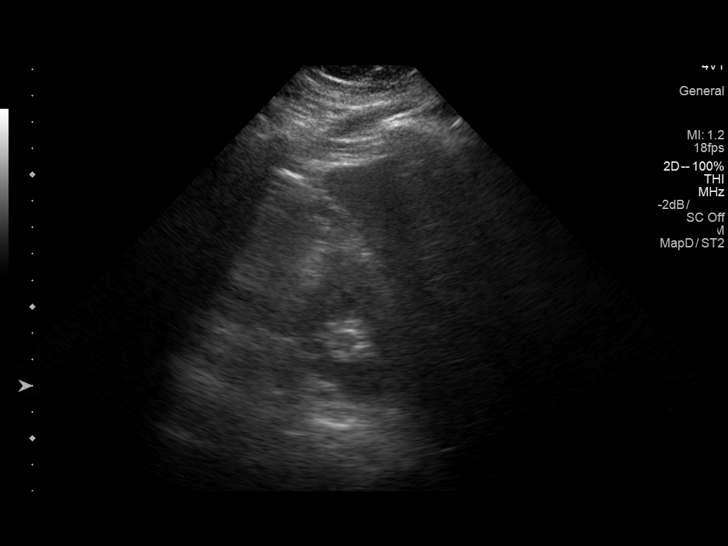
[im 19/51]
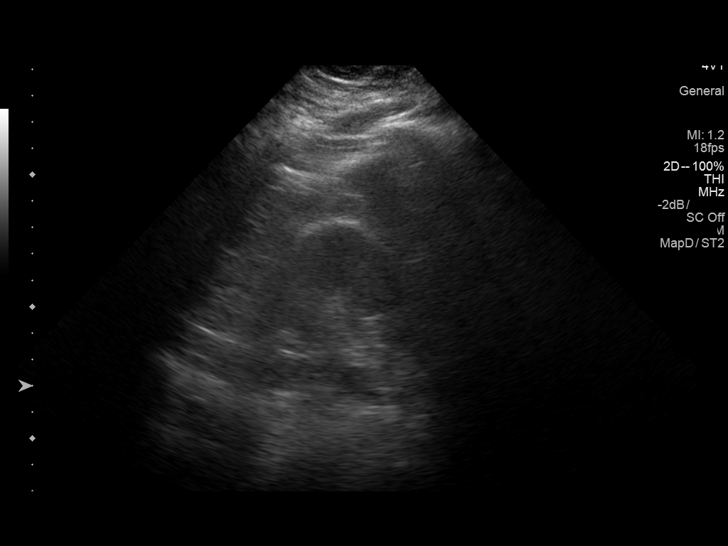
[im 23/51]
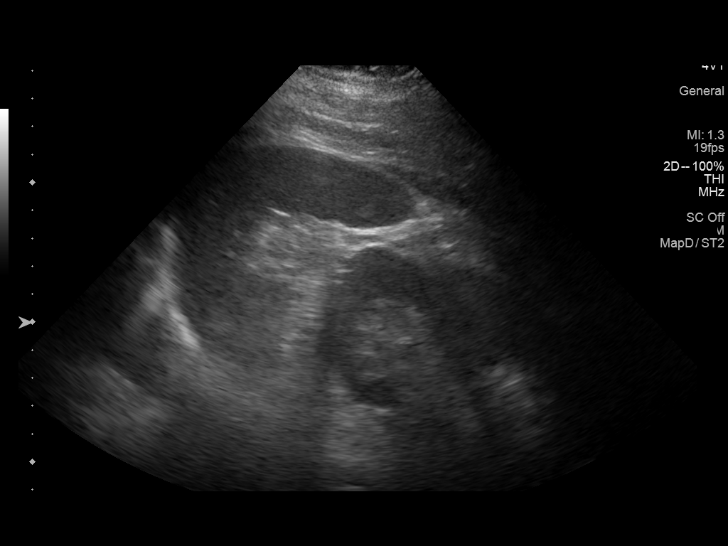
[im 28/51]
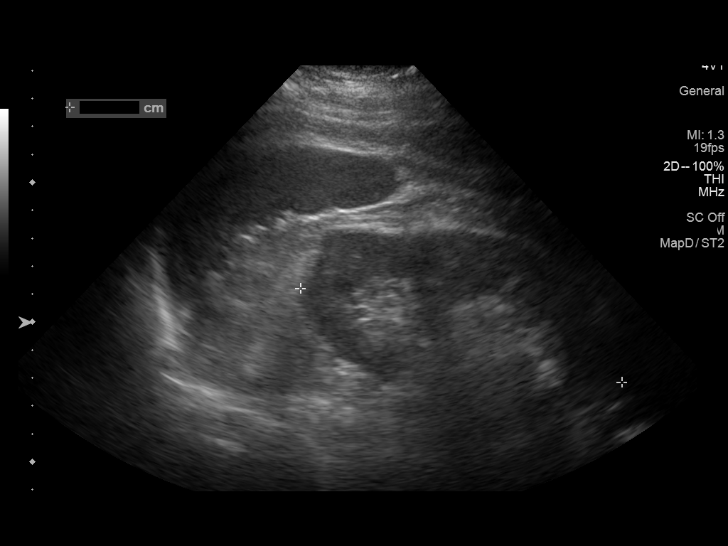
[im 32/51]
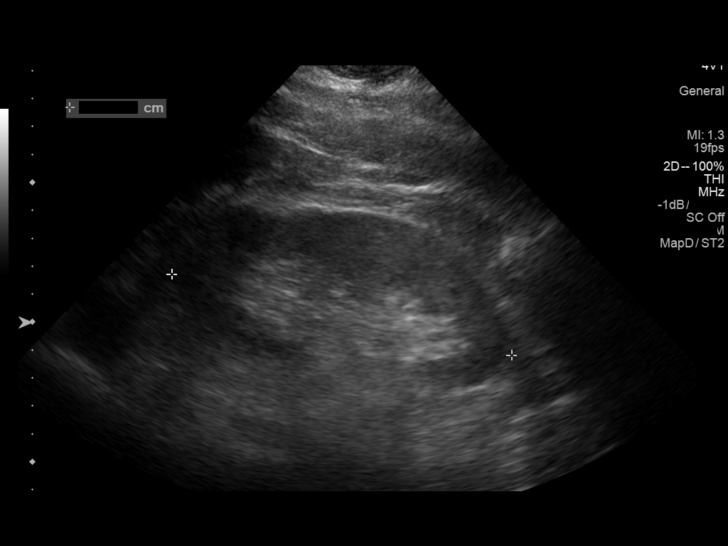
[im 34/51]
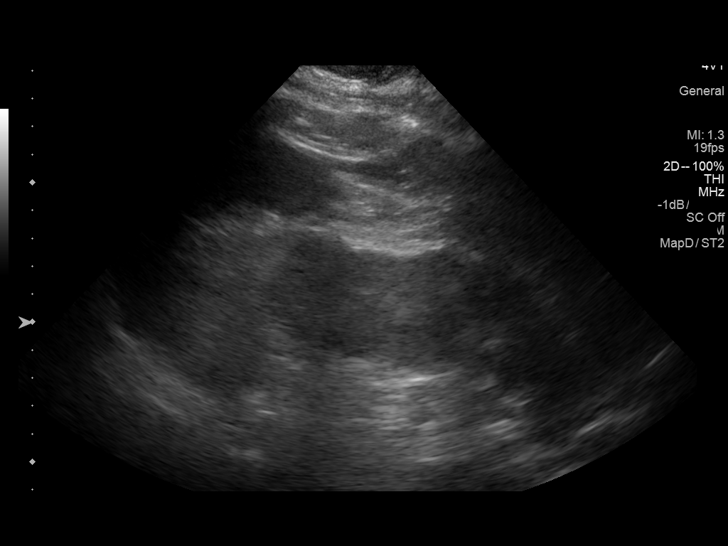
[im 38/51]
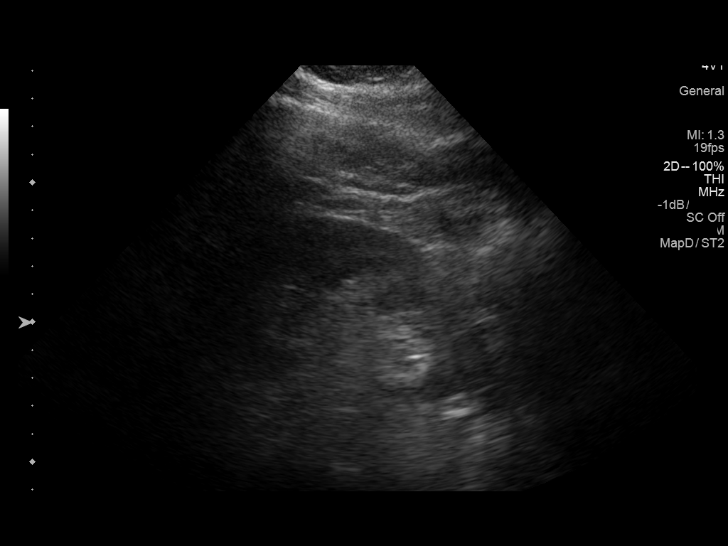
[im 42/51]
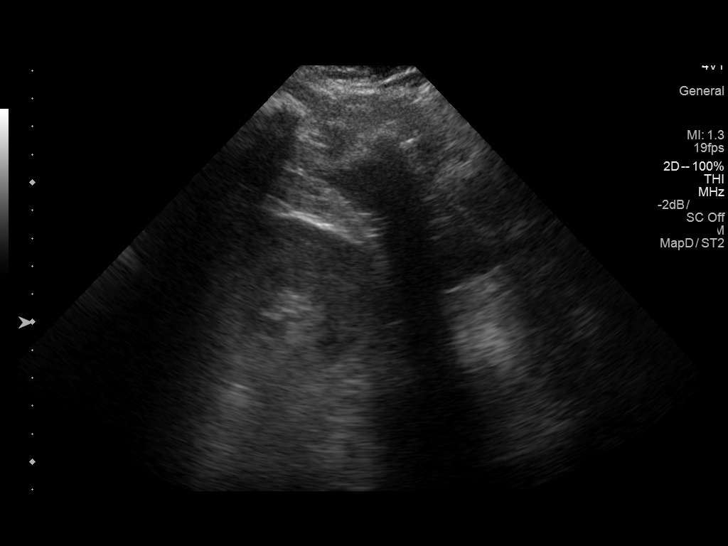
[im 46/51]
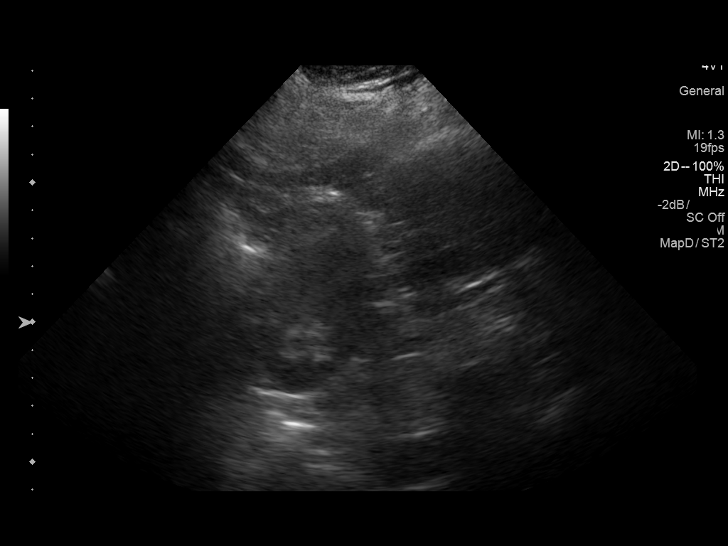
[im 51/51]
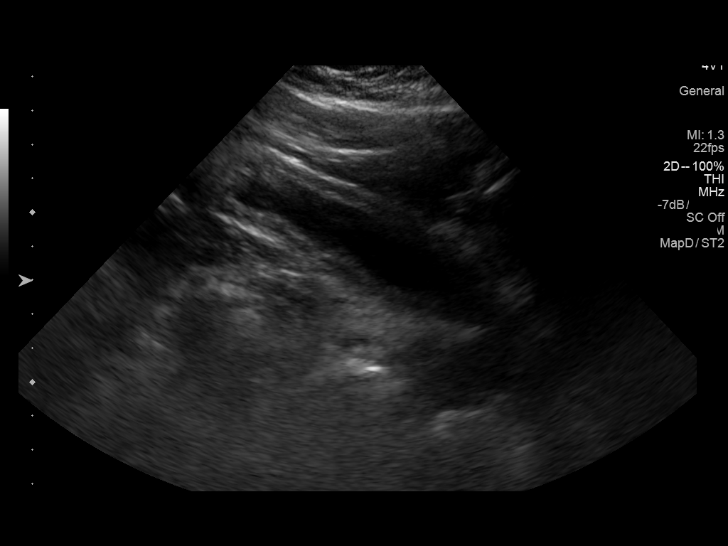

[14 of 25 positions shown; findings below may reference images not displayed]

FINDINGS: Right Kidney:

Length: 12.6 cm.. Echogenicity within normal limits. No mass or
hydronephrosis visualized.

Left Kidney:

Length: 12.5 cm.. Echogenicity within normal limits. No mass or
hydronephrosis visualized.

Bladder:

Nondistended
IMPRESSION: No acute abnormality noted.

## 2017-07-03 IMAGING — CR DG CHEST 2V
2 series · 2 of 2 positions shown · non-contrast
Comparison: None.

CLINICAL DATA: Shortness of breath.  Peripheral edema .

EXAM:
CHEST  2 VIEW

[w chest pa]
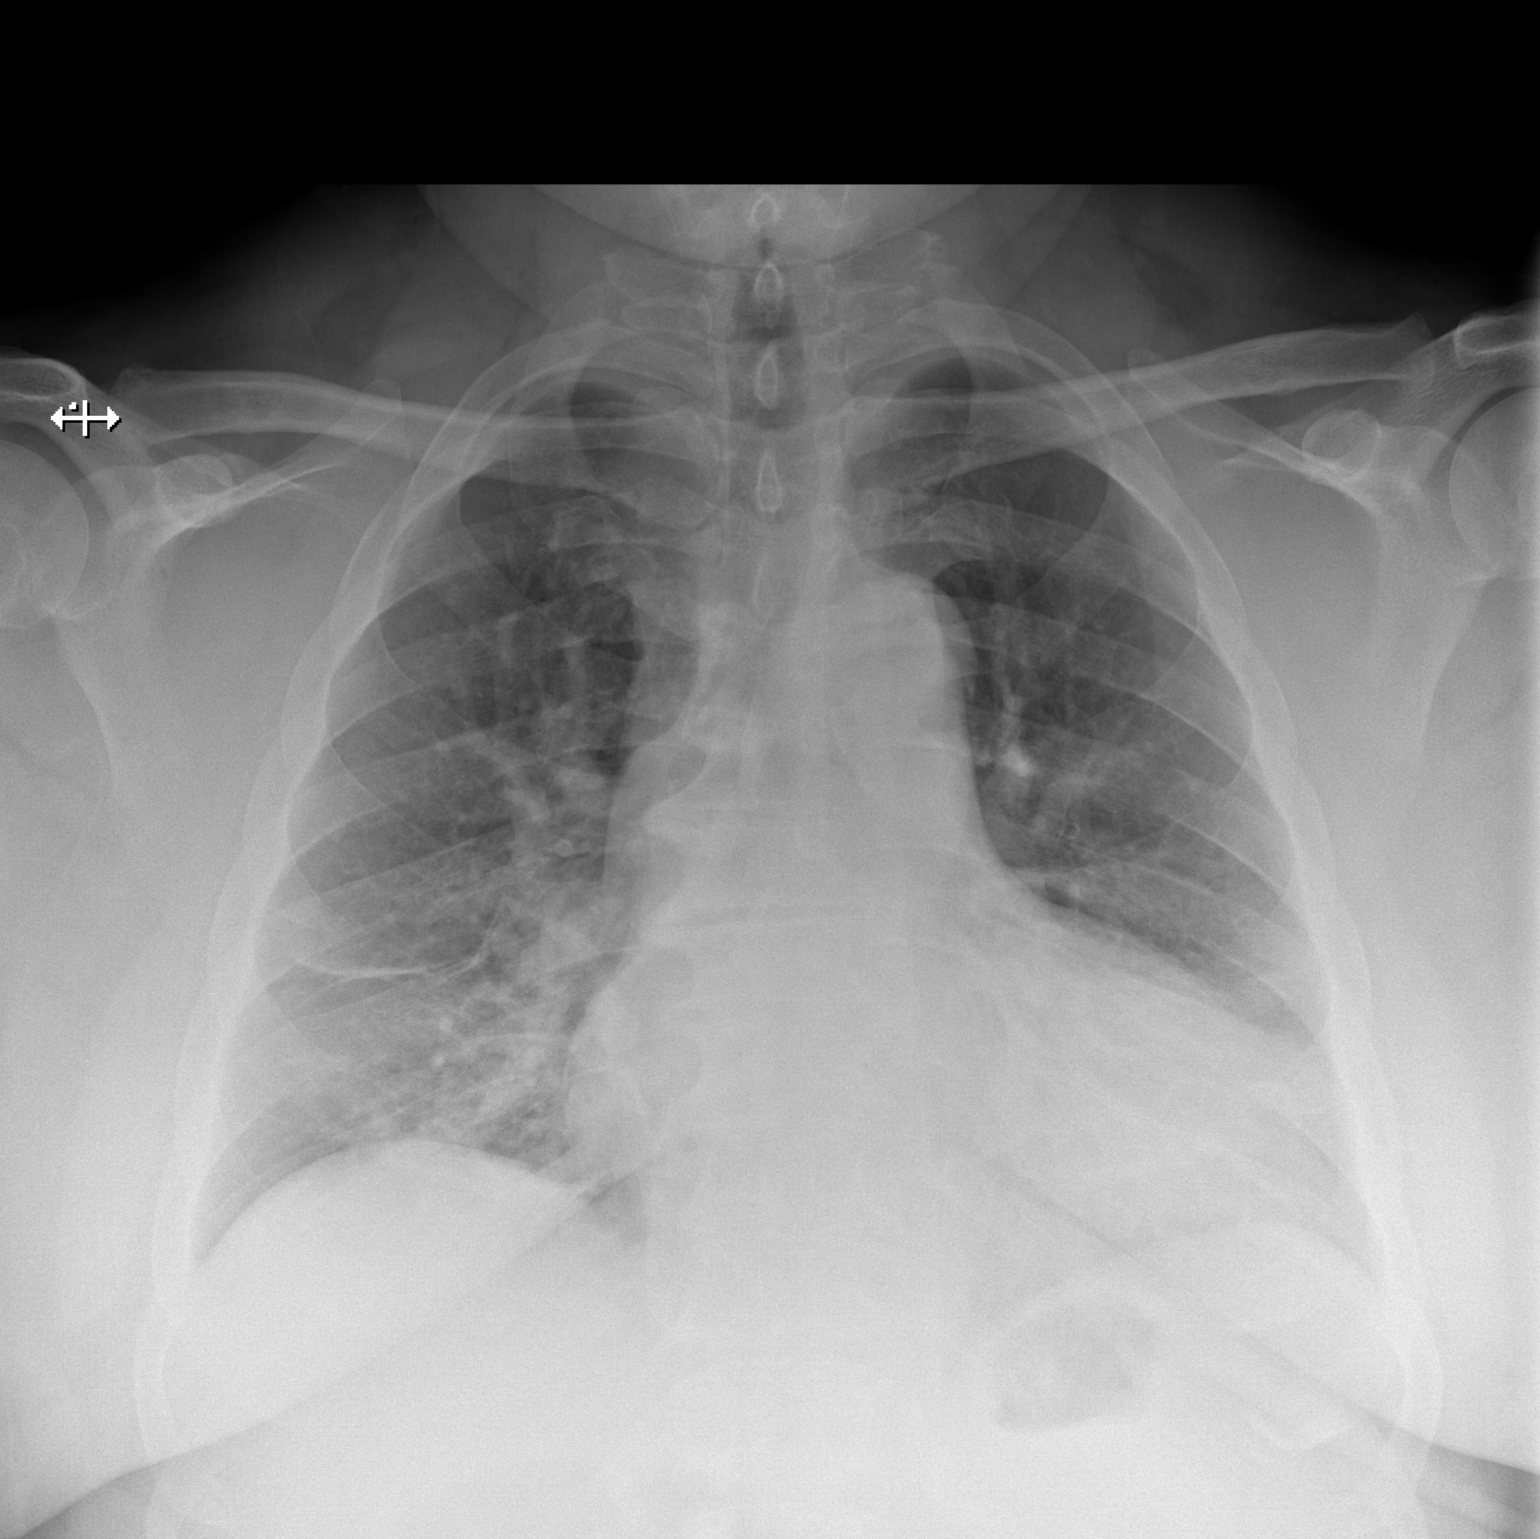

[w chest lat]
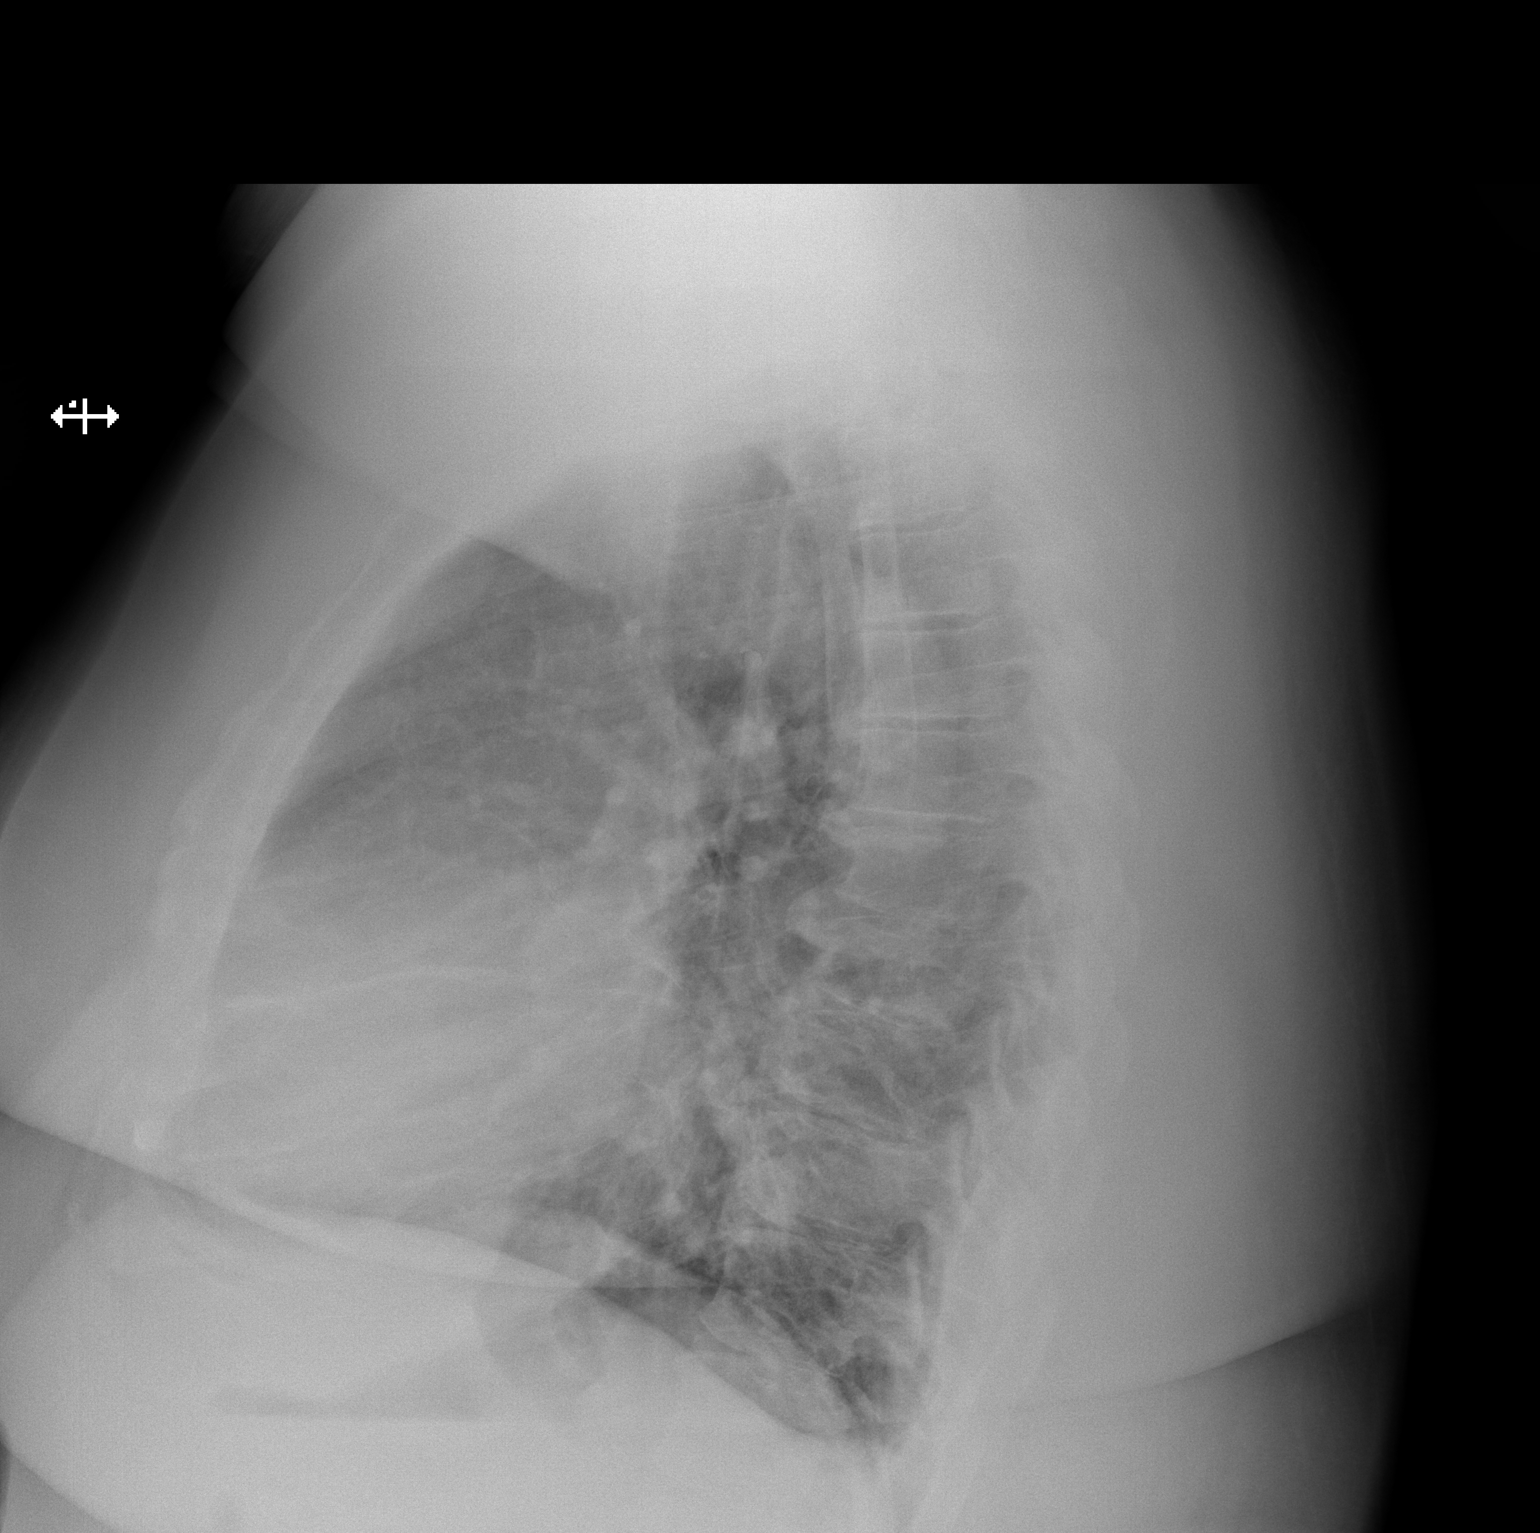

[2 of 2 positions shown; findings below may reference images not displayed]

FINDINGS: Mild enlargement of the cardiac silhouette. Otherwise normal
mediastinal contour. No pneumothorax. No pleural effusion.
Cephalization of the pulmonary vasculature without overt pulmonary
edema. Curvilinear opacities at both lung bases.
IMPRESSION: 1. Mild enlargement of the cardiac silhouette. Cephalization of the
pulmonary vasculature without overt pulmonary edema.
2. Mild curvilinear bibasilar lung opacities, favor scarring or
atelectasis.
